# Patient Record
Sex: Female | Born: 1935
Health system: Southern US, Community
[De-identification: ages and names within clinical notes are randomized; demographics above are authoritative.]

## PROBLEM LIST (undated history)

## (undated) DIAGNOSIS — I10 Essential (primary) hypertension: Secondary | ICD-10-CM

## (undated) DIAGNOSIS — I471 Supraventricular tachycardia, unspecified: Secondary | ICD-10-CM

## (undated) DIAGNOSIS — I4891 Unspecified atrial fibrillation: Secondary | ICD-10-CM

## (undated) DIAGNOSIS — M810 Age-related osteoporosis without current pathological fracture: Secondary | ICD-10-CM

## (undated) DIAGNOSIS — R1310 Dysphagia, unspecified: Secondary | ICD-10-CM

## (undated) DIAGNOSIS — I639 Cerebral infarction, unspecified: Secondary | ICD-10-CM

---

## 2015-07-21 DIAGNOSIS — M25662 Stiffness of left knee, not elsewhere classified: Secondary | ICD-10-CM | POA: Diagnosis not present

## 2015-07-21 DIAGNOSIS — M25562 Pain in left knee: Secondary | ICD-10-CM | POA: Diagnosis not present

## 2015-07-21 DIAGNOSIS — M6281 Muscle weakness (generalized): Secondary | ICD-10-CM | POA: Diagnosis not present

## 2015-07-23 DIAGNOSIS — M6281 Muscle weakness (generalized): Secondary | ICD-10-CM | POA: Diagnosis not present

## 2015-07-23 DIAGNOSIS — M25562 Pain in left knee: Secondary | ICD-10-CM | POA: Diagnosis not present

## 2015-07-23 DIAGNOSIS — M25662 Stiffness of left knee, not elsewhere classified: Secondary | ICD-10-CM | POA: Diagnosis not present

## 2015-07-27 DIAGNOSIS — M25562 Pain in left knee: Secondary | ICD-10-CM | POA: Diagnosis not present

## 2015-07-27 DIAGNOSIS — M25662 Stiffness of left knee, not elsewhere classified: Secondary | ICD-10-CM | POA: Diagnosis not present

## 2015-07-27 DIAGNOSIS — M6281 Muscle weakness (generalized): Secondary | ICD-10-CM | POA: Diagnosis not present

## 2015-07-30 DIAGNOSIS — M25562 Pain in left knee: Secondary | ICD-10-CM | POA: Diagnosis not present

## 2015-07-30 DIAGNOSIS — M25662 Stiffness of left knee, not elsewhere classified: Secondary | ICD-10-CM | POA: Diagnosis not present

## 2015-07-30 DIAGNOSIS — M6281 Muscle weakness (generalized): Secondary | ICD-10-CM | POA: Diagnosis not present

## 2015-08-02 DIAGNOSIS — M25562 Pain in left knee: Secondary | ICD-10-CM | POA: Diagnosis not present

## 2015-08-02 DIAGNOSIS — M25662 Stiffness of left knee, not elsewhere classified: Secondary | ICD-10-CM | POA: Diagnosis not present

## 2015-08-02 DIAGNOSIS — M6281 Muscle weakness (generalized): Secondary | ICD-10-CM | POA: Diagnosis not present

## 2015-08-04 DIAGNOSIS — M25562 Pain in left knee: Secondary | ICD-10-CM | POA: Diagnosis not present

## 2015-08-04 DIAGNOSIS — M25662 Stiffness of left knee, not elsewhere classified: Secondary | ICD-10-CM | POA: Diagnosis not present

## 2015-08-04 DIAGNOSIS — M6281 Muscle weakness (generalized): Secondary | ICD-10-CM | POA: Diagnosis not present

## 2015-08-06 DIAGNOSIS — R21 Rash and other nonspecific skin eruption: Secondary | ICD-10-CM | POA: Diagnosis not present

## 2015-08-06 DIAGNOSIS — N952 Postmenopausal atrophic vaginitis: Secondary | ICD-10-CM | POA: Diagnosis not present

## 2015-08-06 DIAGNOSIS — I10 Essential (primary) hypertension: Secondary | ICD-10-CM | POA: Diagnosis not present

## 2015-08-06 DIAGNOSIS — E039 Hypothyroidism, unspecified: Secondary | ICD-10-CM | POA: Diagnosis not present

## 2015-08-11 DIAGNOSIS — M25562 Pain in left knee: Secondary | ICD-10-CM | POA: Diagnosis not present

## 2015-08-11 DIAGNOSIS — M25662 Stiffness of left knee, not elsewhere classified: Secondary | ICD-10-CM | POA: Diagnosis not present

## 2015-08-11 DIAGNOSIS — M6281 Muscle weakness (generalized): Secondary | ICD-10-CM | POA: Diagnosis not present

## 2015-08-13 DIAGNOSIS — M6281 Muscle weakness (generalized): Secondary | ICD-10-CM | POA: Diagnosis not present

## 2015-08-13 DIAGNOSIS — M25662 Stiffness of left knee, not elsewhere classified: Secondary | ICD-10-CM | POA: Diagnosis not present

## 2015-08-13 DIAGNOSIS — M25562 Pain in left knee: Secondary | ICD-10-CM | POA: Diagnosis not present

## 2015-08-16 DIAGNOSIS — M25562 Pain in left knee: Secondary | ICD-10-CM | POA: Diagnosis not present

## 2015-08-16 DIAGNOSIS — M25662 Stiffness of left knee, not elsewhere classified: Secondary | ICD-10-CM | POA: Diagnosis not present

## 2015-08-16 DIAGNOSIS — M6281 Muscle weakness (generalized): Secondary | ICD-10-CM | POA: Diagnosis not present

## 2015-08-18 DIAGNOSIS — M6281 Muscle weakness (generalized): Secondary | ICD-10-CM | POA: Diagnosis not present

## 2015-08-18 DIAGNOSIS — M25562 Pain in left knee: Secondary | ICD-10-CM | POA: Diagnosis not present

## 2015-08-18 DIAGNOSIS — M25662 Stiffness of left knee, not elsewhere classified: Secondary | ICD-10-CM | POA: Diagnosis not present

## 2015-08-23 DIAGNOSIS — H16213 Exposure keratoconjunctivitis, bilateral: Secondary | ICD-10-CM | POA: Diagnosis not present

## 2015-08-23 DIAGNOSIS — H02055 Trichiasis without entropian left lower eyelid: Secondary | ICD-10-CM | POA: Diagnosis not present

## 2015-08-23 DIAGNOSIS — M6281 Muscle weakness (generalized): Secondary | ICD-10-CM | POA: Diagnosis not present

## 2015-08-23 DIAGNOSIS — H52203 Unspecified astigmatism, bilateral: Secondary | ICD-10-CM | POA: Diagnosis not present

## 2015-08-23 DIAGNOSIS — M25562 Pain in left knee: Secondary | ICD-10-CM | POA: Diagnosis not present

## 2015-08-23 DIAGNOSIS — H16223 Keratoconjunctivitis sicca, not specified as Sjogren's, bilateral: Secondary | ICD-10-CM | POA: Diagnosis not present

## 2015-08-23 DIAGNOSIS — M25662 Stiffness of left knee, not elsewhere classified: Secondary | ICD-10-CM | POA: Diagnosis not present

## 2015-08-23 DIAGNOSIS — H43393 Other vitreous opacities, bilateral: Secondary | ICD-10-CM | POA: Diagnosis not present

## 2015-08-23 DIAGNOSIS — H02052 Trichiasis without entropian right lower eyelid: Secondary | ICD-10-CM | POA: Diagnosis not present

## 2015-08-24 DIAGNOSIS — M12812 Other specific arthropathies, not elsewhere classified, left shoulder: Secondary | ICD-10-CM | POA: Diagnosis not present

## 2015-08-24 DIAGNOSIS — M12811 Other specific arthropathies, not elsewhere classified, right shoulder: Secondary | ICD-10-CM | POA: Diagnosis not present

## 2015-08-25 DIAGNOSIS — M25562 Pain in left knee: Secondary | ICD-10-CM | POA: Diagnosis not present

## 2015-08-25 DIAGNOSIS — M25662 Stiffness of left knee, not elsewhere classified: Secondary | ICD-10-CM | POA: Diagnosis not present

## 2015-08-25 DIAGNOSIS — M6281 Muscle weakness (generalized): Secondary | ICD-10-CM | POA: Diagnosis not present

## 2015-09-13 DIAGNOSIS — Z9889 Other specified postprocedural states: Secondary | ICD-10-CM | POA: Diagnosis not present

## 2015-09-13 DIAGNOSIS — S82042D Displaced comminuted fracture of left patella, subsequent encounter for closed fracture with routine healing: Secondary | ICD-10-CM | POA: Diagnosis not present

## 2015-09-13 DIAGNOSIS — W19XXXD Unspecified fall, subsequent encounter: Secondary | ICD-10-CM | POA: Diagnosis not present

## 2015-09-13 DIAGNOSIS — Z4789 Encounter for other orthopedic aftercare: Secondary | ICD-10-CM | POA: Diagnosis not present

## 2015-10-21 DIAGNOSIS — M12812 Other specific arthropathies, not elsewhere classified, left shoulder: Secondary | ICD-10-CM | POA: Diagnosis not present

## 2015-10-26 DIAGNOSIS — H02052 Trichiasis without entropian right lower eyelid: Secondary | ICD-10-CM | POA: Diagnosis not present

## 2015-10-26 DIAGNOSIS — H52203 Unspecified astigmatism, bilateral: Secondary | ICD-10-CM | POA: Diagnosis not present

## 2015-10-26 DIAGNOSIS — H16213 Exposure keratoconjunctivitis, bilateral: Secondary | ICD-10-CM | POA: Diagnosis not present

## 2015-10-26 DIAGNOSIS — H02055 Trichiasis without entropian left lower eyelid: Secondary | ICD-10-CM | POA: Diagnosis not present

## 2015-10-26 DIAGNOSIS — H16223 Keratoconjunctivitis sicca, not specified as Sjogren's, bilateral: Secondary | ICD-10-CM | POA: Diagnosis not present

## 2015-12-28 DIAGNOSIS — H02055 Trichiasis without entropian left lower eyelid: Secondary | ICD-10-CM | POA: Diagnosis not present

## 2015-12-28 DIAGNOSIS — H02052 Trichiasis without entropian right lower eyelid: Secondary | ICD-10-CM | POA: Diagnosis not present

## 2016-01-20 DIAGNOSIS — M12812 Other specific arthropathies, not elsewhere classified, left shoulder: Secondary | ICD-10-CM | POA: Diagnosis not present

## 2016-01-24 DIAGNOSIS — Z1239 Encounter for other screening for malignant neoplasm of breast: Secondary | ICD-10-CM | POA: Diagnosis not present

## 2016-01-24 DIAGNOSIS — Z0000 Encounter for general adult medical examination without abnormal findings: Secondary | ICD-10-CM | POA: Diagnosis not present

## 2016-01-24 DIAGNOSIS — M150 Primary generalized (osteo)arthritis: Secondary | ICD-10-CM | POA: Diagnosis not present

## 2016-01-24 DIAGNOSIS — I10 Essential (primary) hypertension: Secondary | ICD-10-CM | POA: Diagnosis not present

## 2016-01-24 DIAGNOSIS — E039 Hypothyroidism, unspecified: Secondary | ICD-10-CM | POA: Diagnosis not present

## 2016-01-24 DIAGNOSIS — G609 Hereditary and idiopathic neuropathy, unspecified: Secondary | ICD-10-CM | POA: Diagnosis not present

## 2016-01-24 DIAGNOSIS — Z79899 Other long term (current) drug therapy: Secondary | ICD-10-CM | POA: Diagnosis not present

## 2016-01-24 DIAGNOSIS — M810 Age-related osteoporosis without current pathological fracture: Secondary | ICD-10-CM | POA: Diagnosis not present

## 2016-01-24 DIAGNOSIS — I471 Supraventricular tachycardia: Secondary | ICD-10-CM | POA: Diagnosis not present

## 2016-02-25 DIAGNOSIS — Z1239 Encounter for other screening for malignant neoplasm of breast: Secondary | ICD-10-CM | POA: Diagnosis not present

## 2016-02-25 DIAGNOSIS — M810 Age-related osteoporosis without current pathological fracture: Secondary | ICD-10-CM | POA: Diagnosis not present

## 2016-02-25 DIAGNOSIS — M949 Disorder of cartilage, unspecified: Secondary | ICD-10-CM | POA: Diagnosis not present

## 2016-02-25 DIAGNOSIS — M899 Disorder of bone, unspecified: Secondary | ICD-10-CM | POA: Diagnosis not present

## 2016-02-25 DIAGNOSIS — Z780 Asymptomatic menopausal state: Secondary | ICD-10-CM | POA: Diagnosis not present

## 2016-02-25 DIAGNOSIS — Z1231 Encounter for screening mammogram for malignant neoplasm of breast: Secondary | ICD-10-CM | POA: Diagnosis not present

## 2016-03-01 DIAGNOSIS — H02052 Trichiasis without entropian right lower eyelid: Secondary | ICD-10-CM | POA: Diagnosis not present

## 2016-03-01 DIAGNOSIS — H02055 Trichiasis without entropian left lower eyelid: Secondary | ICD-10-CM | POA: Diagnosis not present

## 2016-03-31 DIAGNOSIS — R05 Cough: Secondary | ICD-10-CM | POA: Diagnosis not present

## 2016-05-25 DIAGNOSIS — M79642 Pain in left hand: Secondary | ICD-10-CM | POA: Diagnosis not present

## 2016-05-31 DIAGNOSIS — M79642 Pain in left hand: Secondary | ICD-10-CM | POA: Diagnosis not present

## 2016-06-15 DIAGNOSIS — M79642 Pain in left hand: Secondary | ICD-10-CM | POA: Diagnosis not present

## 2016-06-26 DIAGNOSIS — H02052 Trichiasis without entropian right lower eyelid: Secondary | ICD-10-CM | POA: Diagnosis not present

## 2016-06-26 DIAGNOSIS — H02055 Trichiasis without entropian left lower eyelid: Secondary | ICD-10-CM | POA: Diagnosis not present

## 2016-07-06 DIAGNOSIS — S62649A Nondisplaced fracture of proximal phalanx of unspecified finger, initial encounter for closed fracture: Secondary | ICD-10-CM | POA: Diagnosis not present

## 2016-09-01 DIAGNOSIS — M810 Age-related osteoporosis without current pathological fracture: Secondary | ICD-10-CM | POA: Diagnosis not present

## 2016-10-03 DIAGNOSIS — H16223 Keratoconjunctivitis sicca, not specified as Sjogren's, bilateral: Secondary | ICD-10-CM | POA: Diagnosis not present

## 2016-10-03 DIAGNOSIS — Z961 Presence of intraocular lens: Secondary | ICD-10-CM | POA: Diagnosis not present

## 2016-10-03 DIAGNOSIS — H43393 Other vitreous opacities, bilateral: Secondary | ICD-10-CM | POA: Diagnosis not present

## 2016-10-03 DIAGNOSIS — H524 Presbyopia: Secondary | ICD-10-CM | POA: Diagnosis not present

## 2016-10-03 DIAGNOSIS — H02052 Trichiasis without entropian right lower eyelid: Secondary | ICD-10-CM | POA: Diagnosis not present

## 2016-10-03 DIAGNOSIS — H02055 Trichiasis without entropian left lower eyelid: Secondary | ICD-10-CM | POA: Diagnosis not present

## 2016-10-03 DIAGNOSIS — H52203 Unspecified astigmatism, bilateral: Secondary | ICD-10-CM | POA: Diagnosis not present

## 2016-10-18 DIAGNOSIS — Z0000 Encounter for general adult medical examination without abnormal findings: Secondary | ICD-10-CM | POA: Diagnosis not present

## 2016-12-06 DIAGNOSIS — H524 Presbyopia: Secondary | ICD-10-CM | POA: Diagnosis not present

## 2016-12-06 DIAGNOSIS — H16223 Keratoconjunctivitis sicca, not specified as Sjogren's, bilateral: Secondary | ICD-10-CM | POA: Diagnosis not present

## 2016-12-06 DIAGNOSIS — H02055 Trichiasis without entropian left lower eyelid: Secondary | ICD-10-CM | POA: Diagnosis not present

## 2016-12-06 DIAGNOSIS — H52203 Unspecified astigmatism, bilateral: Secondary | ICD-10-CM | POA: Diagnosis not present

## 2016-12-06 DIAGNOSIS — H02052 Trichiasis without entropian right lower eyelid: Secondary | ICD-10-CM | POA: Diagnosis not present

## 2017-01-10 DIAGNOSIS — I493 Ventricular premature depolarization: Secondary | ICD-10-CM | POA: Diagnosis not present

## 2017-01-10 DIAGNOSIS — K219 Gastro-esophageal reflux disease without esophagitis: Secondary | ICD-10-CM | POA: Diagnosis not present

## 2017-01-10 DIAGNOSIS — E039 Hypothyroidism, unspecified: Secondary | ICD-10-CM | POA: Diagnosis not present

## 2017-01-10 DIAGNOSIS — Z23 Encounter for immunization: Secondary | ICD-10-CM | POA: Diagnosis not present

## 2017-01-10 DIAGNOSIS — R5383 Other fatigue: Secondary | ICD-10-CM | POA: Diagnosis not present

## 2017-01-10 DIAGNOSIS — R269 Unspecified abnormalities of gait and mobility: Secondary | ICD-10-CM | POA: Diagnosis not present

## 2017-01-10 DIAGNOSIS — I10 Essential (primary) hypertension: Secondary | ICD-10-CM | POA: Diagnosis not present

## 2017-01-10 DIAGNOSIS — M810 Age-related osteoporosis without current pathological fracture: Secondary | ICD-10-CM | POA: Diagnosis not present

## 2017-01-19 DIAGNOSIS — E039 Hypothyroidism, unspecified: Secondary | ICD-10-CM | POA: Diagnosis not present

## 2017-01-19 DIAGNOSIS — I10 Essential (primary) hypertension: Secondary | ICD-10-CM | POA: Diagnosis not present

## 2017-01-19 DIAGNOSIS — M810 Age-related osteoporosis without current pathological fracture: Secondary | ICD-10-CM | POA: Diagnosis not present

## 2017-01-19 DIAGNOSIS — R5383 Other fatigue: Secondary | ICD-10-CM | POA: Diagnosis not present

## 2017-02-13 DIAGNOSIS — H16223 Keratoconjunctivitis sicca, not specified as Sjogren's, bilateral: Secondary | ICD-10-CM | POA: Diagnosis not present

## 2017-02-13 DIAGNOSIS — H02052 Trichiasis without entropian right lower eyelid: Secondary | ICD-10-CM | POA: Diagnosis not present

## 2017-02-13 DIAGNOSIS — H02055 Trichiasis without entropian left lower eyelid: Secondary | ICD-10-CM | POA: Diagnosis not present

## 2017-02-13 DIAGNOSIS — H16213 Exposure keratoconjunctivitis, bilateral: Secondary | ICD-10-CM | POA: Diagnosis not present

## 2017-02-19 DIAGNOSIS — I10 Essential (primary) hypertension: Secondary | ICD-10-CM | POA: Diagnosis not present

## 2017-02-19 DIAGNOSIS — M79672 Pain in left foot: Secondary | ICD-10-CM | POA: Diagnosis not present

## 2017-02-21 DIAGNOSIS — R2689 Other abnormalities of gait and mobility: Secondary | ICD-10-CM | POA: Diagnosis not present

## 2017-02-21 DIAGNOSIS — G8929 Other chronic pain: Secondary | ICD-10-CM | POA: Diagnosis not present

## 2017-02-21 DIAGNOSIS — R29898 Other symptoms and signs involving the musculoskeletal system: Secondary | ICD-10-CM | POA: Diagnosis not present

## 2017-02-21 DIAGNOSIS — M25562 Pain in left knee: Secondary | ICD-10-CM | POA: Diagnosis not present

## 2017-02-21 DIAGNOSIS — M25662 Stiffness of left knee, not elsewhere classified: Secondary | ICD-10-CM | POA: Diagnosis not present

## 2017-02-26 DIAGNOSIS — M79672 Pain in left foot: Secondary | ICD-10-CM | POA: Diagnosis not present

## 2017-02-26 DIAGNOSIS — M216X2 Other acquired deformities of left foot: Secondary | ICD-10-CM | POA: Diagnosis not present

## 2017-02-26 DIAGNOSIS — M7742 Metatarsalgia, left foot: Secondary | ICD-10-CM | POA: Diagnosis not present

## 2017-02-27 DIAGNOSIS — I10 Essential (primary) hypertension: Secondary | ICD-10-CM | POA: Diagnosis not present

## 2017-03-20 DIAGNOSIS — R2689 Other abnormalities of gait and mobility: Secondary | ICD-10-CM | POA: Diagnosis not present

## 2017-04-27 DIAGNOSIS — H02055 Trichiasis without entropian left lower eyelid: Secondary | ICD-10-CM | POA: Diagnosis not present

## 2017-04-27 DIAGNOSIS — H16223 Keratoconjunctivitis sicca, not specified as Sjogren's, bilateral: Secondary | ICD-10-CM | POA: Diagnosis not present

## 2017-04-27 DIAGNOSIS — H02052 Trichiasis without entropian right lower eyelid: Secondary | ICD-10-CM | POA: Diagnosis not present

## 2017-05-22 DIAGNOSIS — E039 Hypothyroidism, unspecified: Secondary | ICD-10-CM | POA: Diagnosis not present

## 2017-05-22 DIAGNOSIS — I10 Essential (primary) hypertension: Secondary | ICD-10-CM | POA: Diagnosis not present

## 2017-05-22 DIAGNOSIS — Z23 Encounter for immunization: Secondary | ICD-10-CM | POA: Diagnosis not present

## 2017-05-22 DIAGNOSIS — K219 Gastro-esophageal reflux disease without esophagitis: Secondary | ICD-10-CM | POA: Diagnosis not present

## 2017-06-11 DIAGNOSIS — H02052 Trichiasis without entropian right lower eyelid: Secondary | ICD-10-CM | POA: Diagnosis not present

## 2017-06-11 DIAGNOSIS — H02055 Trichiasis without entropian left lower eyelid: Secondary | ICD-10-CM | POA: Diagnosis not present

## 2017-06-11 DIAGNOSIS — H04321 Acute dacryocystitis of right lacrimal passage: Secondary | ICD-10-CM | POA: Diagnosis not present

## 2017-06-19 DIAGNOSIS — H04321 Acute dacryocystitis of right lacrimal passage: Secondary | ICD-10-CM | POA: Diagnosis not present

## 2017-06-29 DIAGNOSIS — H04203 Unspecified epiphora, bilateral lacrimal glands: Secondary | ICD-10-CM | POA: Diagnosis not present

## 2017-06-29 DIAGNOSIS — H04563 Stenosis of bilateral lacrimal punctum: Secondary | ICD-10-CM | POA: Diagnosis not present

## 2017-06-29 DIAGNOSIS — H04551 Acquired stenosis of right nasolacrimal duct: Secondary | ICD-10-CM | POA: Diagnosis not present

## 2017-06-29 DIAGNOSIS — H04511 Dacryolith of right lacrimal passage: Secondary | ICD-10-CM | POA: Diagnosis not present

## 2017-06-29 DIAGNOSIS — L0201 Cutaneous abscess of face: Secondary | ICD-10-CM | POA: Diagnosis not present

## 2017-07-02 DIAGNOSIS — H02052 Trichiasis without entropian right lower eyelid: Secondary | ICD-10-CM | POA: Diagnosis not present

## 2017-08-10 DIAGNOSIS — H04321 Acute dacryocystitis of right lacrimal passage: Secondary | ICD-10-CM | POA: Diagnosis not present

## 2017-09-13 DIAGNOSIS — M810 Age-related osteoporosis without current pathological fracture: Secondary | ICD-10-CM | POA: Diagnosis not present

## 2017-09-24 DIAGNOSIS — H04123 Dry eye syndrome of bilateral lacrimal glands: Secondary | ICD-10-CM | POA: Diagnosis not present

## 2017-09-24 DIAGNOSIS — H04563 Stenosis of bilateral lacrimal punctum: Secondary | ICD-10-CM | POA: Diagnosis not present

## 2017-09-24 DIAGNOSIS — H04223 Epiphora due to insufficient drainage, bilateral lacrimal glands: Secondary | ICD-10-CM | POA: Diagnosis not present

## 2017-11-07 DIAGNOSIS — Z01818 Encounter for other preprocedural examination: Secondary | ICD-10-CM | POA: Diagnosis not present

## 2017-11-07 DIAGNOSIS — E039 Hypothyroidism, unspecified: Secondary | ICD-10-CM | POA: Diagnosis not present

## 2017-11-07 DIAGNOSIS — I10 Essential (primary) hypertension: Secondary | ICD-10-CM | POA: Diagnosis not present

## 2017-11-09 DIAGNOSIS — I10 Essential (primary) hypertension: Secondary | ICD-10-CM | POA: Diagnosis not present

## 2017-11-13 DIAGNOSIS — H02052 Trichiasis without entropian right lower eyelid: Secondary | ICD-10-CM | POA: Diagnosis not present

## 2017-11-13 DIAGNOSIS — H04123 Dry eye syndrome of bilateral lacrimal glands: Secondary | ICD-10-CM | POA: Diagnosis not present

## 2017-11-13 DIAGNOSIS — H16213 Exposure keratoconjunctivitis, bilateral: Secondary | ICD-10-CM | POA: Diagnosis not present

## 2017-11-13 DIAGNOSIS — H02055 Trichiasis without entropian left lower eyelid: Secondary | ICD-10-CM | POA: Diagnosis not present

## 2017-11-15 DIAGNOSIS — I493 Ventricular premature depolarization: Secondary | ICD-10-CM | POA: Diagnosis not present

## 2017-11-15 DIAGNOSIS — R000 Tachycardia, unspecified: Secondary | ICD-10-CM | POA: Diagnosis not present

## 2017-11-15 DIAGNOSIS — I499 Cardiac arrhythmia, unspecified: Secondary | ICD-10-CM | POA: Diagnosis not present

## 2017-11-15 DIAGNOSIS — H04551 Acquired stenosis of right nasolacrimal duct: Secondary | ICD-10-CM | POA: Diagnosis not present

## 2017-11-15 DIAGNOSIS — H049 Disorder of lacrimal system, unspecified: Secondary | ICD-10-CM | POA: Diagnosis not present

## 2017-11-15 DIAGNOSIS — Z5309 Procedure and treatment not carried out because of other contraindication: Secondary | ICD-10-CM | POA: Diagnosis not present

## 2017-11-20 DIAGNOSIS — I10 Essential (primary) hypertension: Secondary | ICD-10-CM | POA: Diagnosis not present

## 2017-11-20 DIAGNOSIS — I493 Ventricular premature depolarization: Secondary | ICD-10-CM | POA: Diagnosis not present

## 2017-11-20 DIAGNOSIS — E875 Hyperkalemia: Secondary | ICD-10-CM | POA: Diagnosis not present

## 2017-11-20 DIAGNOSIS — R9431 Abnormal electrocardiogram [ECG] [EKG]: Secondary | ICD-10-CM | POA: Diagnosis not present

## 2017-11-21 DIAGNOSIS — I493 Ventricular premature depolarization: Secondary | ICD-10-CM | POA: Diagnosis not present

## 2017-11-22 DIAGNOSIS — R9431 Abnormal electrocardiogram [ECG] [EKG]: Secondary | ICD-10-CM | POA: Diagnosis not present

## 2017-11-22 DIAGNOSIS — I471 Supraventricular tachycardia: Secondary | ICD-10-CM | POA: Diagnosis not present

## 2017-12-03 DIAGNOSIS — N183 Chronic kidney disease, stage 3 (moderate): Secondary | ICD-10-CM | POA: Diagnosis not present

## 2017-12-03 DIAGNOSIS — M810 Age-related osteoporosis without current pathological fracture: Secondary | ICD-10-CM | POA: Diagnosis not present

## 2017-12-03 DIAGNOSIS — I129 Hypertensive chronic kidney disease with stage 1 through stage 4 chronic kidney disease, or unspecified chronic kidney disease: Secondary | ICD-10-CM | POA: Diagnosis not present

## 2017-12-06 DIAGNOSIS — E039 Hypothyroidism, unspecified: Secondary | ICD-10-CM | POA: Diagnosis not present

## 2017-12-06 DIAGNOSIS — Z888 Allergy status to other drugs, medicaments and biological substances status: Secondary | ICD-10-CM | POA: Diagnosis not present

## 2017-12-06 DIAGNOSIS — Z79899 Other long term (current) drug therapy: Secondary | ICD-10-CM | POA: Diagnosis not present

## 2017-12-06 DIAGNOSIS — I10 Essential (primary) hypertension: Secondary | ICD-10-CM | POA: Diagnosis not present

## 2017-12-06 DIAGNOSIS — Z791 Long term (current) use of non-steroidal anti-inflammatories (NSAID): Secondary | ICD-10-CM | POA: Diagnosis not present

## 2017-12-06 DIAGNOSIS — Z881 Allergy status to other antibiotic agents status: Secondary | ICD-10-CM | POA: Diagnosis not present

## 2017-12-06 DIAGNOSIS — Z91048 Other nonmedicinal substance allergy status: Secondary | ICD-10-CM | POA: Diagnosis not present

## 2017-12-06 DIAGNOSIS — H04559 Acquired stenosis of unspecified nasolacrimal duct: Secondary | ICD-10-CM | POA: Diagnosis not present

## 2017-12-06 DIAGNOSIS — Z91018 Allergy to other foods: Secondary | ICD-10-CM | POA: Diagnosis not present

## 2017-12-06 DIAGNOSIS — Z83518 Family history of other specified eye disorder: Secondary | ICD-10-CM | POA: Diagnosis not present

## 2017-12-06 DIAGNOSIS — M1990 Unspecified osteoarthritis, unspecified site: Secondary | ICD-10-CM | POA: Diagnosis not present

## 2017-12-06 DIAGNOSIS — H04551 Acquired stenosis of right nasolacrimal duct: Secondary | ICD-10-CM | POA: Diagnosis not present

## 2017-12-06 DIAGNOSIS — H04541 Stenosis of right lacrimal canaliculi: Secondary | ICD-10-CM | POA: Diagnosis not present

## 2017-12-06 DIAGNOSIS — K219 Gastro-esophageal reflux disease without esophagitis: Secondary | ICD-10-CM | POA: Diagnosis not present

## 2017-12-17 DIAGNOSIS — H04551 Acquired stenosis of right nasolacrimal duct: Secondary | ICD-10-CM | POA: Diagnosis not present

## 2018-01-01 DIAGNOSIS — H02052 Trichiasis without entropian right lower eyelid: Secondary | ICD-10-CM | POA: Diagnosis not present

## 2018-01-01 DIAGNOSIS — H04551 Acquired stenosis of right nasolacrimal duct: Secondary | ICD-10-CM | POA: Diagnosis not present

## 2018-01-29 DIAGNOSIS — H02052 Trichiasis without entropian right lower eyelid: Secondary | ICD-10-CM | POA: Diagnosis not present

## 2018-01-29 DIAGNOSIS — H04551 Acquired stenosis of right nasolacrimal duct: Secondary | ICD-10-CM | POA: Diagnosis not present

## 2018-03-06 DIAGNOSIS — N183 Chronic kidney disease, stage 3 (moderate): Secondary | ICD-10-CM | POA: Diagnosis not present

## 2018-03-06 DIAGNOSIS — E039 Hypothyroidism, unspecified: Secondary | ICD-10-CM | POA: Diagnosis not present

## 2018-03-06 DIAGNOSIS — I129 Hypertensive chronic kidney disease with stage 1 through stage 4 chronic kidney disease, or unspecified chronic kidney disease: Secondary | ICD-10-CM | POA: Diagnosis not present

## 2018-03-06 DIAGNOSIS — R011 Cardiac murmur, unspecified: Secondary | ICD-10-CM | POA: Diagnosis not present

## 2018-03-06 DIAGNOSIS — Z1231 Encounter for screening mammogram for malignant neoplasm of breast: Secondary | ICD-10-CM | POA: Diagnosis not present

## 2018-03-06 DIAGNOSIS — E559 Vitamin D deficiency, unspecified: Secondary | ICD-10-CM | POA: Diagnosis not present

## 2018-03-08 DIAGNOSIS — I082 Rheumatic disorders of both aortic and tricuspid valves: Secondary | ICD-10-CM | POA: Diagnosis not present

## 2018-03-08 DIAGNOSIS — I272 Pulmonary hypertension, unspecified: Secondary | ICD-10-CM | POA: Diagnosis not present

## 2018-03-14 DIAGNOSIS — I129 Hypertensive chronic kidney disease with stage 1 through stage 4 chronic kidney disease, or unspecified chronic kidney disease: Secondary | ICD-10-CM | POA: Diagnosis not present

## 2018-03-14 DIAGNOSIS — N183 Chronic kidney disease, stage 3 (moderate): Secondary | ICD-10-CM | POA: Diagnosis not present

## 2018-04-02 DIAGNOSIS — H02055 Trichiasis without entropian left lower eyelid: Secondary | ICD-10-CM | POA: Diagnosis not present

## 2018-04-09 DIAGNOSIS — H02052 Trichiasis without entropian right lower eyelid: Secondary | ICD-10-CM | POA: Diagnosis not present

## 2018-04-09 DIAGNOSIS — H04551 Acquired stenosis of right nasolacrimal duct: Secondary | ICD-10-CM | POA: Diagnosis not present

## 2018-05-13 DIAGNOSIS — I129 Hypertensive chronic kidney disease with stage 1 through stage 4 chronic kidney disease, or unspecified chronic kidney disease: Secondary | ICD-10-CM | POA: Diagnosis not present

## 2018-05-13 DIAGNOSIS — N183 Chronic kidney disease, stage 3 (moderate): Secondary | ICD-10-CM | POA: Diagnosis not present

## 2018-06-12 DIAGNOSIS — E039 Hypothyroidism, unspecified: Secondary | ICD-10-CM | POA: Diagnosis not present

## 2018-06-12 DIAGNOSIS — K219 Gastro-esophageal reflux disease without esophagitis: Secondary | ICD-10-CM | POA: Diagnosis not present

## 2018-06-12 DIAGNOSIS — I129 Hypertensive chronic kidney disease with stage 1 through stage 4 chronic kidney disease, or unspecified chronic kidney disease: Secondary | ICD-10-CM | POA: Diagnosis not present

## 2018-06-12 DIAGNOSIS — L259 Unspecified contact dermatitis, unspecified cause: Secondary | ICD-10-CM | POA: Diagnosis not present

## 2018-06-12 DIAGNOSIS — N183 Chronic kidney disease, stage 3 (moderate): Secondary | ICD-10-CM | POA: Diagnosis not present

## 2018-07-02 DIAGNOSIS — I471 Supraventricular tachycardia: Secondary | ICD-10-CM | POA: Diagnosis not present

## 2018-07-25 DIAGNOSIS — M810 Age-related osteoporosis without current pathological fracture: Secondary | ICD-10-CM | POA: Diagnosis not present

## 2018-08-02 DIAGNOSIS — R0982 Postnasal drip: Secondary | ICD-10-CM | POA: Diagnosis not present

## 2018-09-11 DIAGNOSIS — N183 Chronic kidney disease, stage 3 (moderate): Secondary | ICD-10-CM | POA: Diagnosis not present

## 2018-09-11 DIAGNOSIS — Z1239 Encounter for other screening for malignant neoplasm of breast: Secondary | ICD-10-CM | POA: Diagnosis not present

## 2018-09-11 DIAGNOSIS — M150 Primary generalized (osteo)arthritis: Secondary | ICD-10-CM | POA: Diagnosis not present

## 2018-09-11 DIAGNOSIS — E039 Hypothyroidism, unspecified: Secondary | ICD-10-CM | POA: Diagnosis not present

## 2018-09-11 DIAGNOSIS — Z0000 Encounter for general adult medical examination without abnormal findings: Secondary | ICD-10-CM | POA: Diagnosis not present

## 2018-09-11 DIAGNOSIS — D492 Neoplasm of unspecified behavior of bone, soft tissue, and skin: Secondary | ICD-10-CM | POA: Diagnosis not present

## 2018-09-11 DIAGNOSIS — R42 Dizziness and giddiness: Secondary | ICD-10-CM | POA: Diagnosis not present

## 2018-09-11 DIAGNOSIS — M810 Age-related osteoporosis without current pathological fracture: Secondary | ICD-10-CM | POA: Diagnosis not present

## 2018-09-11 DIAGNOSIS — I129 Hypertensive chronic kidney disease with stage 1 through stage 4 chronic kidney disease, or unspecified chronic kidney disease: Secondary | ICD-10-CM | POA: Diagnosis not present

## 2018-09-12 DIAGNOSIS — E039 Hypothyroidism, unspecified: Secondary | ICD-10-CM | POA: Diagnosis not present

## 2018-09-12 DIAGNOSIS — M810 Age-related osteoporosis without current pathological fracture: Secondary | ICD-10-CM | POA: Diagnosis not present

## 2018-09-12 DIAGNOSIS — I129 Hypertensive chronic kidney disease with stage 1 through stage 4 chronic kidney disease, or unspecified chronic kidney disease: Secondary | ICD-10-CM | POA: Diagnosis not present

## 2018-09-12 DIAGNOSIS — N183 Chronic kidney disease, stage 3 (moderate): Secondary | ICD-10-CM | POA: Diagnosis not present

## 2018-09-24 DIAGNOSIS — L578 Other skin changes due to chronic exposure to nonionizing radiation: Secondary | ICD-10-CM | POA: Diagnosis not present

## 2018-09-24 DIAGNOSIS — L570 Actinic keratosis: Secondary | ICD-10-CM | POA: Diagnosis not present

## 2018-11-06 DIAGNOSIS — D0439 Carcinoma in situ of skin of other parts of face: Secondary | ICD-10-CM | POA: Diagnosis not present

## 2018-12-16 DIAGNOSIS — L659 Nonscarring hair loss, unspecified: Secondary | ICD-10-CM | POA: Diagnosis not present

## 2018-12-16 DIAGNOSIS — E559 Vitamin D deficiency, unspecified: Secondary | ICD-10-CM | POA: Diagnosis not present

## 2018-12-16 DIAGNOSIS — M810 Age-related osteoporosis without current pathological fracture: Secondary | ICD-10-CM | POA: Diagnosis not present

## 2018-12-16 DIAGNOSIS — E039 Hypothyroidism, unspecified: Secondary | ICD-10-CM | POA: Diagnosis not present

## 2018-12-16 DIAGNOSIS — R5383 Other fatigue: Secondary | ICD-10-CM | POA: Diagnosis not present

## 2018-12-16 DIAGNOSIS — Z1231 Encounter for screening mammogram for malignant neoplasm of breast: Secondary | ICD-10-CM | POA: Diagnosis not present

## 2018-12-16 DIAGNOSIS — I129 Hypertensive chronic kidney disease with stage 1 through stage 4 chronic kidney disease, or unspecified chronic kidney disease: Secondary | ICD-10-CM | POA: Diagnosis not present

## 2018-12-16 DIAGNOSIS — N183 Chronic kidney disease, stage 3 (moderate): Secondary | ICD-10-CM | POA: Diagnosis not present

## 2018-12-17 DIAGNOSIS — M810 Age-related osteoporosis without current pathological fracture: Secondary | ICD-10-CM | POA: Diagnosis not present

## 2018-12-17 DIAGNOSIS — M8949 Other hypertrophic osteoarthropathy, multiple sites: Secondary | ICD-10-CM | POA: Diagnosis not present

## 2018-12-17 DIAGNOSIS — E559 Vitamin D deficiency, unspecified: Secondary | ICD-10-CM | POA: Diagnosis not present

## 2018-12-17 DIAGNOSIS — R42 Dizziness and giddiness: Secondary | ICD-10-CM | POA: Diagnosis not present

## 2018-12-17 DIAGNOSIS — I471 Supraventricular tachycardia: Secondary | ICD-10-CM | POA: Diagnosis not present

## 2018-12-17 DIAGNOSIS — E039 Hypothyroidism, unspecified: Secondary | ICD-10-CM | POA: Diagnosis not present

## 2018-12-17 DIAGNOSIS — I129 Hypertensive chronic kidney disease with stage 1 through stage 4 chronic kidney disease, or unspecified chronic kidney disease: Secondary | ICD-10-CM | POA: Diagnosis not present

## 2018-12-17 DIAGNOSIS — N183 Chronic kidney disease, stage 3 (moderate): Secondary | ICD-10-CM | POA: Diagnosis not present

## 2018-12-17 DIAGNOSIS — L659 Nonscarring hair loss, unspecified: Secondary | ICD-10-CM | POA: Diagnosis not present

## 2018-12-17 DIAGNOSIS — R5383 Other fatigue: Secondary | ICD-10-CM | POA: Diagnosis not present

## 2019-01-10 DIAGNOSIS — H0100B Unspecified blepharitis left eye, upper and lower eyelids: Secondary | ICD-10-CM | POA: Diagnosis not present

## 2019-01-10 DIAGNOSIS — H02052 Trichiasis without entropian right lower eyelid: Secondary | ICD-10-CM | POA: Diagnosis not present

## 2019-01-10 DIAGNOSIS — H52203 Unspecified astigmatism, bilateral: Secondary | ICD-10-CM | POA: Diagnosis not present

## 2019-01-10 DIAGNOSIS — H02055 Trichiasis without entropian left lower eyelid: Secondary | ICD-10-CM | POA: Diagnosis not present

## 2019-01-10 DIAGNOSIS — H04123 Dry eye syndrome of bilateral lacrimal glands: Secondary | ICD-10-CM | POA: Diagnosis not present

## 2019-01-10 DIAGNOSIS — H0100A Unspecified blepharitis right eye, upper and lower eyelids: Secondary | ICD-10-CM | POA: Diagnosis not present

## 2019-01-10 DIAGNOSIS — H35372 Puckering of macula, left eye: Secondary | ICD-10-CM | POA: Diagnosis not present

## 2019-01-10 DIAGNOSIS — H524 Presbyopia: Secondary | ICD-10-CM | POA: Diagnosis not present

## 2019-01-10 DIAGNOSIS — H353131 Nonexudative age-related macular degeneration, bilateral, early dry stage: Secondary | ICD-10-CM | POA: Diagnosis not present

## 2019-01-10 DIAGNOSIS — H43813 Vitreous degeneration, bilateral: Secondary | ICD-10-CM | POA: Diagnosis not present

## 2019-01-10 DIAGNOSIS — H47093 Other disorders of optic nerve, not elsewhere classified, bilateral: Secondary | ICD-10-CM | POA: Diagnosis not present

## 2019-01-10 DIAGNOSIS — Z961 Presence of intraocular lens: Secondary | ICD-10-CM | POA: Diagnosis not present

## 2019-02-05 DIAGNOSIS — L578 Other skin changes due to chronic exposure to nonionizing radiation: Secondary | ICD-10-CM | POA: Diagnosis not present

## 2019-03-19 DIAGNOSIS — M8949 Other hypertrophic osteoarthropathy, multiple sites: Secondary | ICD-10-CM | POA: Diagnosis not present

## 2019-03-19 DIAGNOSIS — K219 Gastro-esophageal reflux disease without esophagitis: Secondary | ICD-10-CM | POA: Diagnosis not present

## 2019-03-19 DIAGNOSIS — M810 Age-related osteoporosis without current pathological fracture: Secondary | ICD-10-CM | POA: Diagnosis not present

## 2019-03-19 DIAGNOSIS — I129 Hypertensive chronic kidney disease with stage 1 through stage 4 chronic kidney disease, or unspecified chronic kidney disease: Secondary | ICD-10-CM | POA: Diagnosis not present

## 2019-03-19 DIAGNOSIS — E039 Hypothyroidism, unspecified: Secondary | ICD-10-CM | POA: Diagnosis not present

## 2019-03-19 DIAGNOSIS — N183 Chronic kidney disease, stage 3 (moderate): Secondary | ICD-10-CM | POA: Diagnosis not present

## 2019-03-26 ENCOUNTER — Emergency Department (HOSPITAL_COMMUNITY): Payer: PPO

## 2019-03-26 ENCOUNTER — Inpatient Hospital Stay (HOSPITAL_COMMUNITY): Payer: PPO

## 2019-03-26 ENCOUNTER — Emergency Department (HOSPITAL_COMMUNITY): Payer: PPO | Admitting: Certified Registered Nurse Anesthetist

## 2019-03-26 ENCOUNTER — Encounter (HOSPITAL_COMMUNITY)
Admission: EM | Disposition: A | Discharge status: Skilled Nursing Facility | Payer: Self-pay | Source: Home / Self Care | Attending: Neurology

## 2019-03-26 ENCOUNTER — Encounter (HOSPITAL_COMMUNITY): Payer: Self-pay | Admitting: Emergency Medicine

## 2019-03-26 ENCOUNTER — Other Ambulatory Visit: Payer: Self-pay

## 2019-03-26 ENCOUNTER — Inpatient Hospital Stay (HOSPITAL_COMMUNITY)
Admission: EM | Admit: 2019-03-26 | Discharge: 2019-04-02 | DRG: 065 | Disposition: A | Discharge status: Skilled Nursing Facility | Payer: PPO | Attending: Neurology | Admitting: Neurology

## 2019-03-26 DIAGNOSIS — G8194 Hemiplegia, unspecified affecting left nondominant side: Secondary | ICD-10-CM | POA: Diagnosis present

## 2019-03-26 DIAGNOSIS — R29810 Facial weakness: Secondary | ICD-10-CM | POA: Diagnosis not present

## 2019-03-26 DIAGNOSIS — Z880 Allergy status to penicillin: Secondary | ICD-10-CM

## 2019-03-26 DIAGNOSIS — I491 Atrial premature depolarization: Secondary | ICD-10-CM | POA: Diagnosis not present

## 2019-03-26 DIAGNOSIS — N390 Urinary tract infection, site not specified: Secondary | ICD-10-CM | POA: Diagnosis not present

## 2019-03-26 DIAGNOSIS — I10 Essential (primary) hypertension: Secondary | ICD-10-CM | POA: Diagnosis not present

## 2019-03-26 DIAGNOSIS — Z791 Long term (current) use of non-steroidal anti-inflammatories (NSAID): Secondary | ICD-10-CM

## 2019-03-26 DIAGNOSIS — R2689 Other abnormalities of gait and mobility: Secondary | ICD-10-CM | POA: Diagnosis not present

## 2019-03-26 DIAGNOSIS — R1312 Dysphagia, oropharyngeal phase: Secondary | ICD-10-CM | POA: Diagnosis present

## 2019-03-26 DIAGNOSIS — M25422 Effusion, left elbow: Secondary | ICD-10-CM | POA: Diagnosis not present

## 2019-03-26 DIAGNOSIS — Z823 Family history of stroke: Secondary | ICD-10-CM

## 2019-03-26 DIAGNOSIS — R739 Hyperglycemia, unspecified: Secondary | ICD-10-CM | POA: Diagnosis present

## 2019-03-26 DIAGNOSIS — Z8701 Personal history of pneumonia (recurrent): Secondary | ICD-10-CM

## 2019-03-26 DIAGNOSIS — I4891 Unspecified atrial fibrillation: Secondary | ICD-10-CM | POA: Diagnosis not present

## 2019-03-26 DIAGNOSIS — E440 Moderate protein-calorie malnutrition: Secondary | ICD-10-CM | POA: Diagnosis not present

## 2019-03-26 DIAGNOSIS — S199XXA Unspecified injury of neck, initial encounter: Secondary | ICD-10-CM | POA: Diagnosis not present

## 2019-03-26 DIAGNOSIS — R0689 Other abnormalities of breathing: Secondary | ICD-10-CM | POA: Diagnosis present

## 2019-03-26 DIAGNOSIS — R069 Unspecified abnormalities of breathing: Secondary | ICD-10-CM

## 2019-03-26 DIAGNOSIS — J9690 Respiratory failure, unspecified, unspecified whether with hypoxia or hypercapnia: Secondary | ICD-10-CM

## 2019-03-26 DIAGNOSIS — R2681 Unsteadiness on feet: Secondary | ICD-10-CM | POA: Diagnosis not present

## 2019-03-26 DIAGNOSIS — R29818 Other symptoms and signs involving the nervous system: Secondary | ICD-10-CM | POA: Diagnosis not present

## 2019-03-26 DIAGNOSIS — Z8719 Personal history of other diseases of the digestive system: Secondary | ICD-10-CM

## 2019-03-26 DIAGNOSIS — Z79899 Other long term (current) drug therapy: Secondary | ICD-10-CM

## 2019-03-26 DIAGNOSIS — I63513 Cerebral infarction due to unspecified occlusion or stenosis of bilateral middle cerebral arteries: Secondary | ICD-10-CM | POA: Diagnosis not present

## 2019-03-26 DIAGNOSIS — G629 Polyneuropathy, unspecified: Secondary | ICD-10-CM | POA: Diagnosis present

## 2019-03-26 DIAGNOSIS — Z4682 Encounter for fitting and adjustment of non-vascular catheter: Secondary | ICD-10-CM | POA: Diagnosis not present

## 2019-03-26 DIAGNOSIS — H518 Other specified disorders of binocular movement: Secondary | ICD-10-CM | POA: Diagnosis present

## 2019-03-26 DIAGNOSIS — R471 Dysarthria and anarthria: Secondary | ICD-10-CM | POA: Diagnosis not present

## 2019-03-26 DIAGNOSIS — R4189 Other symptoms and signs involving cognitive functions and awareness: Secondary | ICD-10-CM | POA: Diagnosis present

## 2019-03-26 DIAGNOSIS — K5900 Constipation, unspecified: Secondary | ICD-10-CM | POA: Diagnosis not present

## 2019-03-26 DIAGNOSIS — I499 Cardiac arrhythmia, unspecified: Secondary | ICD-10-CM | POA: Diagnosis not present

## 2019-03-26 DIAGNOSIS — M810 Age-related osteoporosis without current pathological fracture: Secondary | ICD-10-CM | POA: Diagnosis not present

## 2019-03-26 DIAGNOSIS — I471 Supraventricular tachycardia, unspecified: Secondary | ICD-10-CM | POA: Diagnosis present

## 2019-03-26 DIAGNOSIS — I129 Hypertensive chronic kidney disease with stage 1 through stage 4 chronic kidney disease, or unspecified chronic kidney disease: Secondary | ICD-10-CM | POA: Diagnosis not present

## 2019-03-26 DIAGNOSIS — R4702 Dysphasia: Secondary | ICD-10-CM | POA: Diagnosis not present

## 2019-03-26 DIAGNOSIS — E039 Hypothyroidism, unspecified: Secondary | ICD-10-CM | POA: Diagnosis present

## 2019-03-26 DIAGNOSIS — E785 Hyperlipidemia, unspecified: Secondary | ICD-10-CM | POA: Diagnosis present

## 2019-03-26 DIAGNOSIS — J9601 Acute respiratory failure with hypoxia: Secondary | ICD-10-CM | POA: Diagnosis not present

## 2019-03-26 DIAGNOSIS — I63511 Cerebral infarction due to unspecified occlusion or stenosis of right middle cerebral artery: Secondary | ICD-10-CM | POA: Diagnosis not present

## 2019-03-26 DIAGNOSIS — R278 Other lack of coordination: Secondary | ICD-10-CM | POA: Diagnosis not present

## 2019-03-26 DIAGNOSIS — M1990 Unspecified osteoarthritis, unspecified site: Secondary | ICD-10-CM | POA: Diagnosis present

## 2019-03-26 DIAGNOSIS — N183 Chronic kidney disease, stage 3 unspecified: Secondary | ICD-10-CM | POA: Diagnosis not present

## 2019-03-26 DIAGNOSIS — Z789 Other specified health status: Secondary | ICD-10-CM | POA: Diagnosis not present

## 2019-03-26 DIAGNOSIS — W19XXXA Unspecified fall, initial encounter: Secondary | ICD-10-CM | POA: Diagnosis present

## 2019-03-26 DIAGNOSIS — Y92019 Unspecified place in single-family (private) house as the place of occurrence of the external cause: Secondary | ICD-10-CM

## 2019-03-26 DIAGNOSIS — Z20828 Contact with and (suspected) exposure to other viral communicable diseases: Secondary | ICD-10-CM | POA: Diagnosis present

## 2019-03-26 DIAGNOSIS — H01009 Unspecified blepharitis unspecified eye, unspecified eyelid: Secondary | ICD-10-CM | POA: Diagnosis present

## 2019-03-26 DIAGNOSIS — M75102 Unspecified rotator cuff tear or rupture of left shoulder, not specified as traumatic: Secondary | ICD-10-CM | POA: Diagnosis present

## 2019-03-26 DIAGNOSIS — I493 Ventricular premature depolarization: Secondary | ICD-10-CM | POA: Diagnosis present

## 2019-03-26 DIAGNOSIS — Z6820 Body mass index (BMI) 20.0-20.9, adult: Secondary | ICD-10-CM

## 2019-03-26 DIAGNOSIS — I63512 Cerebral infarction due to unspecified occlusion or stenosis of left middle cerebral artery: Secondary | ICD-10-CM | POA: Diagnosis not present

## 2019-03-26 DIAGNOSIS — K219 Gastro-esophageal reflux disease without esophagitis: Secondary | ICD-10-CM | POA: Diagnosis present

## 2019-03-26 DIAGNOSIS — Z79890 Hormone replacement therapy (postmenopausal): Secondary | ICD-10-CM

## 2019-03-26 DIAGNOSIS — I639 Cerebral infarction, unspecified: Secondary | ICD-10-CM | POA: Diagnosis present

## 2019-03-26 DIAGNOSIS — Z9181 History of falling: Secondary | ICD-10-CM | POA: Diagnosis not present

## 2019-03-26 DIAGNOSIS — M6281 Muscle weakness (generalized): Secondary | ICD-10-CM | POA: Diagnosis not present

## 2019-03-26 DIAGNOSIS — I350 Nonrheumatic aortic (valve) stenosis: Secondary | ICD-10-CM | POA: Diagnosis present

## 2019-03-26 DIAGNOSIS — R414 Neurologic neglect syndrome: Secondary | ICD-10-CM | POA: Diagnosis not present

## 2019-03-26 DIAGNOSIS — G459 Transient cerebral ischemic attack, unspecified: Secondary | ICD-10-CM | POA: Diagnosis not present

## 2019-03-26 DIAGNOSIS — Z8249 Family history of ischemic heart disease and other diseases of the circulatory system: Secondary | ICD-10-CM

## 2019-03-26 DIAGNOSIS — J9811 Atelectasis: Secondary | ICD-10-CM | POA: Diagnosis not present

## 2019-03-26 DIAGNOSIS — I69391 Dysphagia following cerebral infarction: Secondary | ICD-10-CM | POA: Diagnosis not present

## 2019-03-26 DIAGNOSIS — R4781 Slurred speech: Secondary | ICD-10-CM | POA: Diagnosis not present

## 2019-03-26 DIAGNOSIS — I9589 Other hypotension: Secondary | ICD-10-CM | POA: Diagnosis not present

## 2019-03-26 DIAGNOSIS — Z7401 Bed confinement status: Secondary | ICD-10-CM | POA: Diagnosis not present

## 2019-03-26 DIAGNOSIS — M24412 Recurrent dislocation, left shoulder: Secondary | ICD-10-CM | POA: Diagnosis present

## 2019-03-26 DIAGNOSIS — I480 Paroxysmal atrial fibrillation: Secondary | ICD-10-CM | POA: Diagnosis not present

## 2019-03-26 DIAGNOSIS — R52 Pain, unspecified: Secondary | ICD-10-CM

## 2019-03-26 DIAGNOSIS — I6601 Occlusion and stenosis of right middle cerebral artery: Secondary | ICD-10-CM | POA: Diagnosis not present

## 2019-03-26 DIAGNOSIS — I361 Nonrheumatic tricuspid (valve) insufficiency: Secondary | ICD-10-CM | POA: Diagnosis not present

## 2019-03-26 DIAGNOSIS — Z888 Allergy status to other drugs, medicaments and biological substances status: Secondary | ICD-10-CM

## 2019-03-26 DIAGNOSIS — D649 Anemia, unspecified: Secondary | ICD-10-CM | POA: Diagnosis present

## 2019-03-26 DIAGNOSIS — M25512 Pain in left shoulder: Secondary | ICD-10-CM | POA: Diagnosis not present

## 2019-03-26 DIAGNOSIS — R4701 Aphasia: Secondary | ICD-10-CM | POA: Diagnosis present

## 2019-03-26 DIAGNOSIS — S51811A Laceration without foreign body of right forearm, initial encounter: Secondary | ICD-10-CM | POA: Diagnosis present

## 2019-03-26 DIAGNOSIS — I340 Nonrheumatic mitral (valve) insufficiency: Secondary | ICD-10-CM | POA: Diagnosis not present

## 2019-03-26 DIAGNOSIS — I69354 Hemiplegia and hemiparesis following cerebral infarction affecting left non-dominant side: Secondary | ICD-10-CM | POA: Diagnosis not present

## 2019-03-26 DIAGNOSIS — R6889 Other general symptoms and signs: Secondary | ICD-10-CM | POA: Diagnosis present

## 2019-03-26 DIAGNOSIS — I63411 Cerebral infarction due to embolism of right middle cerebral artery: Secondary | ICD-10-CM | POA: Diagnosis not present

## 2019-03-26 DIAGNOSIS — I69398 Other sequelae of cerebral infarction: Secondary | ICD-10-CM | POA: Diagnosis not present

## 2019-03-26 DIAGNOSIS — Z881 Allergy status to other antibiotic agents status: Secondary | ICD-10-CM

## 2019-03-26 DIAGNOSIS — J9600 Acute respiratory failure, unspecified whether with hypoxia or hypercapnia: Secondary | ICD-10-CM | POA: Diagnosis not present

## 2019-03-26 DIAGNOSIS — J189 Pneumonia, unspecified organism: Secondary | ICD-10-CM

## 2019-03-26 DIAGNOSIS — Z7951 Long term (current) use of inhaled steroids: Secondary | ICD-10-CM

## 2019-03-26 DIAGNOSIS — I69392 Facial weakness following cerebral infarction: Secondary | ICD-10-CM | POA: Diagnosis not present

## 2019-03-26 DIAGNOSIS — R29707 NIHSS score 7: Secondary | ICD-10-CM | POA: Diagnosis present

## 2019-03-26 DIAGNOSIS — M25812 Other specified joint disorders, left shoulder: Secondary | ICD-10-CM | POA: Diagnosis not present

## 2019-03-26 DIAGNOSIS — M2550 Pain in unspecified joint: Secondary | ICD-10-CM | POA: Diagnosis not present

## 2019-03-26 DIAGNOSIS — Z825 Family history of asthma and other chronic lower respiratory diseases: Secondary | ICD-10-CM

## 2019-03-26 HISTORY — PX: RADIOLOGY WITH ANESTHESIA: SHX6223

## 2019-03-26 HISTORY — PX: IR ANGIO VERTEBRAL SEL SUBCLAVIAN INNOMINATE UNI R MOD SED: IMG5365

## 2019-03-26 HISTORY — PX: IR ANGIO INTRA EXTRACRAN SEL COM CAROTID INNOMINATE UNI R MOD SED: IMG5359

## 2019-03-26 LAB — COMPREHENSIVE METABOLIC PANEL
ALT: 14 U/L (ref 0–44)
AST: 29 U/L (ref 15–41)
Albumin: 3.8 g/dL (ref 3.5–5.0)
Alkaline Phosphatase: 57 U/L (ref 38–126)
Anion gap: 9 (ref 5–15)
BUN: 14 mg/dL (ref 8–23)
CO2: 25 mmol/L (ref 22–32)
Calcium: 9.1 mg/dL (ref 8.9–10.3)
Chloride: 100 mmol/L (ref 98–111)
Creatinine, Ser: 0.96 mg/dL (ref 0.44–1.00)
GFR calc Af Amer: >60 mL/min (ref >60)
GFR calc non Af Amer: 54 mL/min — ABNORMAL LOW (ref >60)
Glucose, Bld: 165 mg/dL — ABNORMAL HIGH (ref 70–99)
Potassium: 4.5 mmol/L (ref 3.5–5.1)
Sodium: 134 mmol/L — ABNORMAL LOW (ref 135–145)
Total Bilirubin: 0.9 mg/dL (ref 0.3–1.2)
Total Protein: 6.1 g/dL — ABNORMAL LOW (ref 6.5–8.1)

## 2019-03-26 LAB — DIFFERENTIAL
Abs Immature Granulocytes: 0.07 10*3/uL (ref 0.00–0.07)
Basophils Absolute: 0.1 10*3/uL (ref 0.0–0.1)
Basophils Relative: 0 %
Eosinophils Absolute: 0 10*3/uL (ref 0.0–0.5)
Eosinophils Relative: 0 %
Immature Granulocytes: 0 %
Lymphocytes Relative: 7 %
Lymphs Abs: 1.1 10*3/uL (ref 0.7–4.0)
Monocytes Absolute: 1.1 10*3/uL — ABNORMAL HIGH (ref 0.1–1.0)
Monocytes Relative: 7 %
Neutro Abs: 13.9 10*3/uL — ABNORMAL HIGH (ref 1.7–7.7)
Neutrophils Relative %: 86 %

## 2019-03-26 LAB — POCT I-STAT 7, (LYTES, BLD GAS, ICA,H+H)
Acid-Base Excess: 1 mmol/L (ref 0.0–2.0)
Bicarbonate: 23.6 mmol/L (ref 20.0–28.0)
Calcium, Ion: 1.17 mmol/L (ref 1.15–1.40)
HCT: 37 % (ref 36.0–46.0)
Hemoglobin: 12.6 g/dL (ref 12.0–15.0)
O2 Saturation: 100 %
Patient temperature: 98
Potassium: 4 mmol/L (ref 3.5–5.1)
Sodium: 134 mmol/L — ABNORMAL LOW (ref 135–145)
TCO2: 25 mmol/L (ref 22–32)
pCO2 arterial: 29.9 mmHg — ABNORMAL LOW (ref 32.0–48.0)
pH, Arterial: 7.505 — ABNORMAL HIGH (ref 7.350–7.450)
pO2, Arterial: 154 mmHg — ABNORMAL HIGH (ref 83.0–108.0)

## 2019-03-26 LAB — URINALYSIS, ROUTINE W REFLEX MICROSCOPIC
Bacteria, UA: NONE SEEN
Bilirubin Urine: NEGATIVE
Glucose, UA: NEGATIVE mg/dL
Hgb urine dipstick: NEGATIVE
Ketones, ur: NEGATIVE mg/dL
Nitrite: NEGATIVE
Protein, ur: NEGATIVE mg/dL
Specific Gravity, Urine: 1.019 (ref 1.005–1.030)
pH: 7 (ref 5.0–8.0)

## 2019-03-26 LAB — CBC
HCT: 37.4 % (ref 36.0–46.0)
Hemoglobin: 12.5 g/dL (ref 12.0–15.0)
MCH: 31.3 pg (ref 26.0–34.0)
MCHC: 33.4 g/dL (ref 30.0–36.0)
MCV: 93.5 fL (ref 80.0–100.0)
Platelets: 190 10*3/uL (ref 150–400)
RBC: 4 MIL/uL (ref 3.87–5.11)
RDW: 13.2 % (ref 11.5–15.5)
WBC: 16.2 10*3/uL — ABNORMAL HIGH (ref 4.0–10.5)
nRBC: 0 % (ref 0.0–0.2)

## 2019-03-26 LAB — SARS CORONAVIRUS 2 BY RT PCR (HOSPITAL ORDER, PERFORMED IN ~~LOC~~ HOSPITAL LAB): SARS Coronavirus 2: NEGATIVE

## 2019-03-26 LAB — I-STAT CHEM 8, ED
BUN: 16 mg/dL (ref 8–23)
Calcium, Ion: 1.15 mmol/L (ref 1.15–1.40)
Chloride: 99 mmol/L (ref 98–111)
Creatinine, Ser: 0.9 mg/dL (ref 0.44–1.00)
Glucose, Bld: 158 mg/dL — ABNORMAL HIGH (ref 70–99)
HCT: 38 % (ref 36.0–46.0)
Hemoglobin: 12.9 g/dL (ref 12.0–15.0)
Potassium: 4.5 mmol/L (ref 3.5–5.1)
Sodium: 134 mmol/L — ABNORMAL LOW (ref 135–145)
TCO2: 26 mmol/L (ref 22–32)

## 2019-03-26 LAB — PROTIME-INR
INR: 1 (ref 0.8–1.2)
Prothrombin Time: 13.3 seconds (ref 11.4–15.2)

## 2019-03-26 LAB — RAPID URINE DRUG SCREEN, HOSP PERFORMED
Amphetamines: NOT DETECTED
Barbiturates: NOT DETECTED
Benzodiazepines: NOT DETECTED
Cocaine: NOT DETECTED
Opiates: NOT DETECTED
Tetrahydrocannabinol: NOT DETECTED

## 2019-03-26 LAB — APTT: aPTT: 28 s (ref 24–36)

## 2019-03-26 LAB — ETHANOL: Alcohol, Ethyl (B): <10 mg/dL

## 2019-03-26 LAB — CK: Total CK: 79 U/L (ref 38–234)

## 2019-03-26 SURGERY — RADIOLOGY WITH ANESTHESIA
Anesthesia: General | Laterality: Left

## 2019-03-26 MED ORDER — ASPIRIN 81 MG PO CHEW
CHEWABLE_TABLET | ORAL | Status: AC
  Filled 2019-03-26: qty 1

## 2019-03-26 MED ORDER — CLOPIDOGREL BISULFATE 300 MG PO TABS
ORAL_TABLET | ORAL | Status: AC
  Filled 2019-03-26: qty 1

## 2019-03-26 MED ORDER — EPTIFIBATIDE 20 MG/10ML IV SOLN
INTRAVENOUS | Status: AC
  Filled 2019-03-26: qty 10

## 2019-03-26 MED ORDER — SODIUM CHLORIDE 0.9 % IV SOLN
INTRAVENOUS | Status: DC
  Administered 2019-03-26 – 2019-03-30 (×4): via INTRAVENOUS

## 2019-03-26 MED ORDER — LACTATED RINGERS IV SOLN
INTRAVENOUS | Status: DC | PRN
  Administered 2019-03-26: 21:00:00 via INTRAVENOUS

## 2019-03-26 MED ORDER — AMIODARONE IV BOLUS ONLY 150 MG/100ML
INTRAVENOUS | Status: DC | PRN
  Administered 2019-03-26: 300 mg via INTRAVENOUS

## 2019-03-26 MED ORDER — ORAL CARE MOUTH RINSE
15.0000 mL | OROMUCOSAL | Status: DC
  Administered 2019-03-27 (×8): 15 mL via OROMUCOSAL

## 2019-03-26 MED ORDER — IOHEXOL 300 MG/ML  SOLN
150.0000 mL | Freq: Once | INTRAMUSCULAR | Status: AC | PRN
  Administered 2019-03-26: 22:00:00 35 mL via INTRA_ARTERIAL

## 2019-03-26 MED ORDER — ACETAMINOPHEN 650 MG RE SUPP: 650.0000 mg | RECTAL | Status: DC | PRN

## 2019-03-26 MED ORDER — HYDRALAZINE HCL 20 MG/ML IJ SOLN
INTRAMUSCULAR | Status: AC
  Administered 2019-03-27: 10 mg via INTRAVENOUS
  Filled 2019-03-26: qty 1

## 2019-03-26 MED ORDER — LIDOCAINE HCL (CARDIAC) PF 100 MG/5ML IV SOSY
PREFILLED_SYRINGE | INTRAVENOUS | Status: DC | PRN
  Administered 2019-03-26: 40 mg via INTRATRACHEAL

## 2019-03-26 MED ORDER — SODIUM CHLORIDE 0.9 % IV SOLN
INTRAVENOUS | Status: DC | PRN
  Administered 2019-03-26: 80 ug/min via INTRAVENOUS

## 2019-03-26 MED ORDER — ESMOLOL HCL 100 MG/10ML IV SOLN
INTRAVENOUS | Status: DC | PRN
  Administered 2019-03-26: 10 mg via INTRAVENOUS
  Administered 2019-03-26: 20 mg via INTRAVENOUS
  Administered 2019-03-26: 40 mg via INTRAVENOUS
  Administered 2019-03-26 (×2): 30 mg via INTRAVENOUS

## 2019-03-26 MED ORDER — ACETAMINOPHEN 325 MG PO TABS
650.0000 mg | ORAL_TABLET | ORAL | Status: DC | PRN
  Administered 2019-04-01: 650 mg via ORAL
  Filled 2019-03-26 (×2): qty 2

## 2019-03-26 MED ORDER — SUCCINYLCHOLINE CHLORIDE 20 MG/ML IJ SOLN
INTRAMUSCULAR | Status: DC | PRN
  Administered 2019-03-26: 100 mg via INTRAVENOUS

## 2019-03-26 MED ORDER — HYDRALAZINE HCL 20 MG/ML IJ SOLN
2.0000 mg | INTRAMUSCULAR | Status: DC | PRN
  Administered 2019-03-26: 2 mg via INTRAVENOUS

## 2019-03-26 MED ORDER — VANCOMYCIN HCL IN DEXTROSE 1-5 GM/200ML-% IV SOLN
INTRAVENOUS | Status: AC
  Filled 2019-03-26: qty 200

## 2019-03-26 MED ORDER — FENTANYL CITRATE (PF) 100 MCG/2ML IJ SOLN
25.0000 ug | INTRAMUSCULAR | Status: DC | PRN
  Administered 2019-03-26: 23:00:00 50 ug via INTRAVENOUS

## 2019-03-26 MED ORDER — CLEVIDIPINE BUTYRATE 0.5 MG/ML IV EMUL
0.0000 mg/h | INTRAVENOUS | Status: AC
  Administered 2019-03-26: 21 mg/h via INTRAVENOUS
  Administered 2019-03-26: 23:00:00 4 mg/h via INTRAVENOUS
  Administered 2019-03-27: 21 mg/h via INTRAVENOUS
  Filled 2019-03-26: qty 100

## 2019-03-26 MED ORDER — PROPOFOL 10 MG/ML IV BOLUS
INTRAVENOUS | Status: DC | PRN
  Administered 2019-03-26: 60 mg via INTRAVENOUS

## 2019-03-26 MED ORDER — BISACODYL 10 MG RE SUPP
10.0000 mg | Freq: Every day | RECTAL | Status: DC | PRN
  Administered 2019-04-01: 10 mg via RECTAL
  Filled 2019-03-26 (×2): qty 1

## 2019-03-26 MED ORDER — CLEVIDIPINE BUTYRATE 0.5 MG/ML IV EMUL
INTRAVENOUS | Status: AC
  Filled 2019-03-26: qty 50

## 2019-03-26 MED ORDER — ACETAMINOPHEN 160 MG/5ML PO SOLN: 650.0000 mg | ORAL | Status: DC | PRN

## 2019-03-26 MED ORDER — NITROGLYCERIN 1 MG/10 ML FOR IR/CATH LAB
INTRA_ARTERIAL | Status: AC
  Filled 2019-03-26: qty 10

## 2019-03-26 MED ORDER — PROPOFOL 1000 MG/100ML IV EMUL
0.0000 ug/kg/min | INTRAVENOUS | Status: DC
  Administered 2019-03-27: 5 ug/kg/min via INTRAVENOUS

## 2019-03-26 MED ORDER — TICAGRELOR 90 MG PO TABS
ORAL_TABLET | ORAL | Status: AC
  Filled 2019-03-26: qty 2

## 2019-03-26 MED ORDER — FENTANYL CITRATE (PF) 100 MCG/2ML IJ SOLN
INTRAMUSCULAR | Status: AC
  Administered 2019-03-26: 50 ug via INTRAVENOUS
  Filled 2019-03-26: qty 2

## 2019-03-26 MED ORDER — ACETAMINOPHEN 325 MG PO TABS: 650.0000 mg | ORAL_TABLET | ORAL | Status: DC | PRN

## 2019-03-26 MED ORDER — TIROFIBAN HCL IN NACL 5-0.9 MG/100ML-% IV SOLN
INTRAVENOUS | Status: AC
  Filled 2019-03-26: qty 100

## 2019-03-26 MED ORDER — SENNOSIDES-DOCUSATE SODIUM 8.6-50 MG PO TABS: 1.0000 | ORAL_TABLET | Freq: Every evening | ORAL | Status: DC | PRN

## 2019-03-26 MED ORDER — SODIUM CHLORIDE 0.9 % IV SOLN: INTRAVENOUS | Status: DC

## 2019-03-26 MED ORDER — CHLORHEXIDINE GLUCONATE CLOTH 2 % EX PADS
6.0000 | MEDICATED_PAD | Freq: Every day | CUTANEOUS | Status: DC
  Administered 2019-03-27 – 2019-04-01 (×6): 6 via TOPICAL

## 2019-03-26 MED ORDER — LABETALOL HCL 5 MG/ML IV SOLN
INTRAVENOUS | Status: AC
  Administered 2019-03-26: 20 mg
  Filled 2019-03-26: qty 4

## 2019-03-26 MED ORDER — CHLORHEXIDINE GLUCONATE 0.12% ORAL RINSE (MEDLINE KIT)
15.0000 mL | Freq: Two times a day (BID) | OROMUCOSAL | Status: DC
  Administered 2019-03-26 – 2019-03-27 (×2): 15 mL via OROMUCOSAL

## 2019-03-26 MED ORDER — STROKE: EARLY STAGES OF RECOVERY BOOK
Freq: Once | Status: AC
  Administered 2019-03-26
  Filled 2019-03-26: qty 1

## 2019-03-26 MED ORDER — FENTANYL CITRATE (PF) 100 MCG/2ML IJ SOLN: 25.0000 ug | INTRAMUSCULAR | Status: DC | PRN

## 2019-03-26 MED ORDER — ROCURONIUM BROMIDE 100 MG/10ML IV SOLN
INTRAVENOUS | Status: DC | PRN
  Administered 2019-03-26: 50 mg via INTRAVENOUS

## 2019-03-26 MED ORDER — PANTOPRAZOLE SODIUM 40 MG IV SOLR
40.0000 mg | Freq: Every day | INTRAVENOUS | Status: DC
  Administered 2019-03-27: 40 mg via INTRAVENOUS
  Filled 2019-03-26: qty 40

## 2019-03-26 MED ORDER — IOHEXOL 350 MG/ML SOLN
100.0000 mL | Freq: Once | INTRAVENOUS | Status: AC | PRN
  Administered 2019-03-26: 100 mL via INTRAVENOUS

## 2019-03-26 NOTE — ED Notes (Signed)
Anesthesia at bedside intubating

## 2019-03-26 NOTE — Progress Notes (Signed)
eLink Physician-Brief Progress Note Patient Name: Nancy Zamora DOB: Nov 20, 1930 MRN: [id number]   Date of Service  03/26/2019  HPI/Events of Note  Pt is s/p right MCA territory CVA with unsuccessful thrombectomy due to distal location of clot. She went into SVT peri-operatively and was started on an Amiodarone infusion. She is intubated and on the ventilator.  eICU Interventions  New patient evaluation completed. PCCM on the ground requested to consult on patient.        Kerry Kass Smaran Gaus 03/26/2019, 10:40 PM

## 2019-03-26 NOTE — H&P (Addendum)
NEURO HOSPITALIST H & P   Requesting physician: Dr. Tyrone Nine  Reason for Consult: Acute stroke symptoms  History obtained from:  Patient, Son and Chart    HPI:                                                                                                                                          Nancy Zamora is an 85 y.o. female who lives in a townhome independently with an mRS of 0. She lives alone and was LKN per one report at 1100 on Tuesday. Her son, who is the only informant present, states that he spoke with her on the phone at about 9 PM last night and that she was conversing normally with no complaints. The patient tells team that at 1100 today she fell from a standing position. Her son later came by her home at 5 PM and noted that she had slurred speech, left sided facial droop, right gaze preference and left arm weakness. EMS was called and she was brought to the ED emergently. Code Stroke was called in the ED.   NIHSS of 7 was obtained during CT.   PMHx Dry eye and blepharitis Nonrheumatic aortic valve stenosis HTN CKD3 Left rotator cuff tear arthropathy Right rotator cuff arthropathy s/p RSA 2014 Chronic cough Osteoarthritis Osteopenia/Osteoporosis Paroxysmal SVT and PVCs Peripheral neuropathy Anemia Diverticulitis Hypothyroidism  PSHx   FHx COPD, stroke, asthma, heart disease, cataracts, glaucoma and macular degeneration            Social History:  reports that she has never smoked. She has never used smokeless tobacco. No history on file for alcohol and drug.  Allergies Adhesive tape Chocolate flavor Gabapentin Tetracycline  HOME MEDICATIONS:                                                                                                                     Cardizem CD 180 mg po qd Prolia 1 mL injection on 9/2 Synthroid 112 mcg, 1.5 tablets po qd 2 days per week, 1 tablet po 5 days per week Vitamin D2 Cozaar 25 mg po qd Prilosec 40 mg  po BID Iprattopium 42 mcg 2 sprays PRN Acyclovir 800 mg po TID Artificial tears PRN Genteal gel opht PRN Miralax 17 g po qd  Vitamin C MVI    ROS:                                                                                                                                       No headache, CP, SOB, fever, chills. Has right shoulder pain. Denies any other symptoms on comprehensive ROS. Unaware of any left sided neurological symptoms due to anosognosia.  Blood pressure (!) 160/85, pulse 89, temperature 98.3 F (36.8 C), temperature source Oral, resp. rate 14, SpO2 99 %.   General Examination:                                                                                                       Physical Exam  HEENT-  [place]/AT .   Lungs- Respirations unlabored   Extremities- Warm and well perfused  Neurological Examination Mental Status: Alert, fully oriented except to city. Left hemineglect and anosognosia. Speech is dysarthric but fluent. Able to name all objects presented. Repetition moderately impaired. Able to follow all commands. Cranial Nerves: II: Visual fields intact bilaterally with all 4 quadrants tested individually. There is extinction on the left to DSS. PERRL.  III,IV, VI: No ptosis not present. EOMI without nystagmus.  V,VII: Left facial droop. Facial FT sensation subjectively normal bilaterally VIII: Hearing intact to voice IX,X: Mild pharyngeal dysarthria XI: Head preferentially rotated to the right XII: midline tongue extension Motor: RUE and RLE 5/5 LUE 2/5 proximally and distally LLE 4-/5 proximally and distally  Sensory: Light touch intact BUE with extinction on the left. LT decreased to LLE. Extinction on left also noted with lower extremity DSS.  Deep Tendon Reflexes: 2+ RUE, 1+ LUE. 2+ right patellar, 1+ left patellar. Toes upgoing bilaterally.  Cerebellar: No ataxia to RUE. Unable to test LUE.  Gait: Deferred   Lab Results: Basic Metabolic Panel: No  results for input(s): NA, K, CL, CO2, GLUCOSE, BUN, CREATININE, CALCIUM, MG, PHOS in the last 168 hours.  CBC: No results for input(s): WBC, NEUTROABS, HGB, HCT, MCV, PLT in the last 168 hours.  Cardiac Enzymes: No results for input(s): CKTOTAL, CKMB, CKMBINDEX, TROPONINI in the last 168 hours.  Lipid Panel: No results for input(s): CHOL, TRIG, HDL, CHOLHDL, VLDL, LDLCALC in the last 168 hours.  Imaging:  CT head: 1. No intracranial hemorrhage. 2. ASPECTS is 8 with early hypoattenuation seen in the right basal ganglia and sub-insular white matter on the right  CTA of head and neck with CTP: 1. Occlusion of a mid-distal  right M2 branch, corresponding to the area of ischemia on the perfusion scan. These results were called by telephone at the time of interpretation on 03/26/2019 at 7:56 pm to Dr.Caitlin Ainley [facility] , who verbally acknowledged these results. 2. 5 mL right MCA territory infarction, predominantly in the right frontal operculum, with 22 mL area of ischemic penumbra. 3. No hemodynamically significant stenosis of the carotid or vertebral arteries by the NASCET criteria.  EKG: Sinus rhythm Multiple premature complexes, vent & supraven Minimal ST depression, lateral leads  Assessment: 85 year old female with acute right MCA stroke, most likely embolic. Exam with left sided neglect, rightward eye deviation, LUE and LLE weakness, left sided sensory deficit and left facial droop with dysarthria. NIHSS of 7.  1. The patient is not a tPA candidate due to time criteria.  2. The patient is a VIR candidate. Discussed with son extensively the risks/benefits of proceeding with VIR and possible clot retrieval, versus risks/benefits of no intervention. The patient's son provided informed consent for VIR, intubation and anesthesia after all questions were answered. Due to her anosognosia, the patient is unable to provide informed consent.    Reccommedations: 1. Being transported emergently to  [place].  2. Admitting to the ICU under the Neurology service following VIR.  3. Will obtain MRI brain and TTE.  4. Cardiac telemetry.  5. Post-VIR orders and BP parameters per Dr. Estanislado Pandy 6. On amiodarone gtt for sustained episode of SVT episode during VIR. HR following procedure is 113 with BP 130s/70s. Will consult CCM for amiodarone drip management.   Addendum: VIR team unable to proceed to clot retrieval due to its distal location as well as occurrence of sustained SVT during the angiogram. The patient is stable under sedation following VIR. Discussed prognosis with son. Also discussed treatment plan with CCM. Although patient had no complaints of neck pain or head/neck trauma due to her fall when assessed in the ED, we will obtain a CT neck to definitively rule out possible fracture in the context of her osteoporosis and osteoarthritis diagnoses. Amiodarone drip has been discontinued due to risk of severe hypotension given that she is sedated with propofol. Her HR has normalized. Per son, she has had one prior episode of SVT in the past which was thought to be a side effect of anesthesia.   90 minutes spent in the emergent neurological evaluation and management of this critically ill patient.    Electronically signed: Dr. Kerney Elbe 03/26/2019, 7:10 PM

## 2019-03-26 NOTE — ED Provider Notes (Signed)
[facility] EMERGENCY DEPARTMENT Provider Note   CSN: [id number] Arrival date & time: 03/26/19  [id number]     History   Chief Complaint Chief Complaint  Patient presents with   Aphasia    HPI Shamelia Zafar is a 85 y.o. female.     85 yo F with a chief complaint of a fall.  This was unwitnessed and happened at some point her home.  Patient lives alone.  She is a bit confused about what exactly happened.  Family checks on her frequently throughout the day though were busy earlier today and so last time they called her was last night about 10 PM.  They spoke with a neighbor who said they talked with her about 11:30 AM and that she was normal at that time.  When they found her on the floor they noted that she was slurring her speech and that she was weak on the left side.  She does have some chronic left upper extremity weakness due to recurrent fractures of the shoulder.  Level 5 caveat acuity of condition.  The history is provided by the patient and the EMS personnel.  Illness Severity:  Severe Onset quality:  Sudden Duration:  8 hours Timing:  Constant Progression:  Unchanged Chronicity:  New Associated symptoms: no chest pain, no congestion, no fever, no headaches, no myalgias, no nausea, no rhinorrhea, no shortness of breath, no vomiting and no wheezing     History reviewed. No pertinent past medical history.  There are no active problems to display for this patient.   History reviewed. No pertinent surgical history.   OB History   No obstetric history on file.      Home Medications    Prior to Admission medications   Medication Sig Start Date End Date Taking? Authorizing Provider  celecoxib (CELEBREX) 100 MG capsule Take 100 mg by mouth 2 (two) times daily.   Yes [provider]  esomeprazole (NEXIUM) 40 MG capsule Take 40 mg by mouth daily at 12 noon.   Yes [provider]  ipratropium (ATROVENT) 0.06 % nasal spray Place 2 sprays  into both nostrils 4 (four) times daily as needed for rhinitis.   Yes [provider]  meclizine (ANTIVERT) 12.5 MG tablet Take 12.5 mg by mouth 3 (three) times daily as needed for dizziness.   Yes [provider]  mometasone (NASONEX) 50 MCG/ACT nasal spray Place 2 sprays into the nose daily.   Yes [provider]  RABEprazole (ACIPHEX) 20 MG tablet Take 20 mg by mouth daily.   Yes [provider]  triamcinolone cream (KENALOG) 0.1 % Apply 1 application topically 2 (two) times daily.   Yes [provider]    Family History History reviewed. No pertinent family history.  Social History Social History   Tobacco Use   Smoking status: Never Smoker   Smokeless tobacco: Never Used  Substance Use Topics   Alcohol use: Not on file   Drug use: Not on file     Allergies   Chocolate, Gabapentin, Penicillins, and Tetracyclines & related   Review of Systems Review of Systems  Constitutional: Negative for chills and fever.  HENT: Negative for congestion and rhinorrhea.   Eyes: Negative for redness and visual disturbance.  Respiratory: Negative for shortness of breath and wheezing.   Cardiovascular: Negative for chest pain and palpitations.  Gastrointestinal: Negative for nausea and vomiting.  Genitourinary: Negative for dysuria and urgency.  Musculoskeletal: Negative for arthralgias and myalgias.  Skin: Negative for pallor and wound.  Neurological: Positive for facial asymmetry and weakness. Negative for dizziness and headaches.     Physical Exam Updated Vital Signs BP (!) 161/136    Pulse 93    Temp 98.3 F (36.8 C) (Oral)    Resp 20    SpO2 97%   Physical Exam Vitals signs and nursing note reviewed.  Constitutional:      General: She is not in acute distress.    Appearance: She is well-developed. She is not diaphoretic.  HENT:     Head: Normocephalic and atraumatic.  Eyes:     Pupils: Pupils are equal, round, and reactive to  light.  Neck:     Musculoskeletal: Normal range of motion and neck supple.  Cardiovascular:     Rate and Rhythm: Normal rate and regular rhythm.     Heart sounds: No murmur. No friction rub. No gallop.   Pulmonary:     Effort: Pulmonary effort is normal.     Breath sounds: No wheezing or rales.  Abdominal:     General: There is no distension.     Palpations: Abdomen is soft.     Tenderness: There is no abdominal tenderness.  Musculoskeletal:        General: No tenderness.  Skin:    General: Skin is warm and dry.  Neurological:     Mental Status: She is alert and oriented to person, place, and time.     Comments: Patient is mildly confused on exam.  She has right-sided gaze deviation.  Left-sided weakness worse to the upper extremity.  She has some left-sided facial droop.  Psychiatric:        Behavior: Behavior normal.      ED Treatments / Results  Labs (all labs ordered are listed, but only abnormal results are displayed) Labs Reviewed  CBC - Abnormal; Notable for the following components:      Result Value   WBC 16.2 (*)    All other components within normal limits  DIFFERENTIAL - Abnormal; Notable for the following components:   Neutro Abs 13.9 (*)    Monocytes Absolute 1.1 (*)    All other components within normal limits  COMPREHENSIVE METABOLIC PANEL - Abnormal; Notable for the following components:   Sodium 134 (*)    Glucose, Bld 165 (*)    Total Protein 6.1 (*)    GFR calc non Af Amer 54 (*)    All other components within normal limits  I-STAT CHEM 8, ED - Abnormal; Notable for the following components:   Sodium 134 (*)    Glucose, Bld 158 (*)    All other components within normal limits  SARS CORONAVIRUS 2 (HOSPITAL ORDER, [place] [facility])  ETHANOL  PROTIME-INR  APTT  RAPID URINE DRUG SCREEN, HOSP PERFORMED  URINALYSIS, ROUTINE W REFLEX MICROSCOPIC  CK    EKG EKG Interpretation  Date/Time:  Wednesday March 26 2019 18:58:11  EDT Ventricular Rate:  77 PR Interval:    QRS Duration: 88 QT Interval:  387 QTC Calculation: 438 R Axis:   75 Text Interpretation:  Sinus rhythm Multiple premature complexes, vent & supraven Minimal ST depression, lateral leads No old tracing to compare Confirmed by Deno Etienne [phone]) on 03/26/2019 7:43:46 PM   Radiology Ct Code Stroke Cta Head W/wo Contrast  Result Date: 03/26/2019 CLINICAL DATA:  Left-sided weakness EXAM: CT ANGIOGRAPHY HEAD AND NECK CT PERFUSION BRAIN TECHNIQUE: Multidetector CT imaging of the head and neck  was performed using the standard protocol during bolus administration of intravenous contrast. Multiplanar CT image reconstructions and MIPs were obtained to evaluate the vascular anatomy. Carotid stenosis measurements (when applicable) are obtained utilizing NASCET criteria, using the distal internal carotid diameter as the denominator. Multiphase CT imaging of the brain was performed following IV bolus contrast injection. Subsequent parametric perfusion maps were calculated using RAPID software. CONTRAST:  134mL OMNIPAQUE IOHEXOL 350 MG/ML SOLN COMPARISON:  Head CT 03/26/2019 FINDINGS: CTA NECK FINDINGS SKELETON: Multilevel degenerative disc disease and facet hypertrophy without bony spinal canal stenosis. OTHER NECK: Normal pharynx, larynx and major salivary glands. No cervical lymphadenopathy. Unremarkable thyroid gland. UPPER CHEST: No pneumothorax or pleural effusion. No nodules or masses. AORTIC ARCH: There is mild calcific atherosclerosis of the aortic arch. There is no aneurysm, dissection or hemodynamically significant stenosis of the visualized ascending aorta and aortic arch. Normal variant aortic arch branching pattern with the brachiocephalic and left common carotid arteries sharing a common origin. The visualized proximal subclavian arteries are widely patent. RIGHT CAROTID SYSTEM: --Common carotid artery: Widely patent origin without common carotid artery dissection  or aneurysm. --Internal carotid artery: No dissection, occlusion or aneurysm. Mild atherosclerotic calcification at the carotid bifurcation without hemodynamically significant stenosis. --External carotid artery: No acute abnormality. LEFT CAROTID SYSTEM: --Common carotid artery: Widely patent origin without common carotid artery dissection or aneurysm. --Internal carotid artery: No dissection, occlusion or aneurysm. Mild atherosclerotic calcification at the carotid bifurcation without hemodynamically significant stenosis. --External carotid artery: No acute abnormality. VERTEBRAL ARTERIES: Left dominant configuration. Both origins are clearly patent. No dissection, occlusion or flow-limiting stenosis to the skull base (V1-V3 segments). CTA HEAD FINDINGS POSTERIOR CIRCULATION: --Vertebral arteries: The right vertebral artery terminates in PICA. Left vertebral artery V4 segment is normal. --Posterior inferior cerebellar arteries (PICA): Normal --Anterior inferior cerebellar arteries (AICA): Patent origins from the basilar artery. --Basilar artery: Normal. --Superior cerebellar arteries: Normal. --Posterior cerebral arteries: Normal. The right PCA is predominantly supplied by the posterior communicating artery. ANTERIOR CIRCULATION: --Intracranial internal carotid arteries: Normal. --Anterior cerebral arteries (ACA): Normal. Both A1 segments are present. Patent anterior communicating artery (a-comm). --Middle cerebral arteries (MCA): On the right, there is occlusion of a mid to distal right M2 branch (series 11, image 24 and series 12, image 12). VENOUS SINUSES: As permitted by contrast timing, patent. ANATOMIC VARIANTS: None Review of the MIP images confirms the above findings. CT Brain Perfusion Findings: ASPECTS: 8 CBF (<30%) Volume: 39mL Perfusion (Tmax>6.0s) volume: 79mL Mismatch Volume: 45mL Infarction Location:Right MCA territory, predominantly the right frontal operculum IMPRESSION: 1. Occlusion of a mid-distal  right M2 branch, corresponding to the area of ischemia on the perfusion scan. These results were called by telephone at the time of interpretation on 03/26/2019 at 7:56 pm to Dr.ERIC [facility] , who verbally acknowledged these results. 2. 5 mL right MCA territory infarction, predominantly in the right frontal operculum, with 22 mL area of ischemic penumbra. 3. No hemodynamically significant stenosis of the carotid or vertebral arteries by the NASCET criteria. Aortic Atherosclerosis (ICD10-I70.0). Electronically Signed   By: Ulyses Jarred M.D.   On: 03/26/2019 20:07   Ct Code Stroke Cta Neck W/wo Contrast  Result Date: 03/26/2019 CLINICAL DATA:  Left-sided weakness EXAM: CT ANGIOGRAPHY HEAD AND NECK CT PERFUSION BRAIN TECHNIQUE: Multidetector CT imaging of the head and neck was performed using the standard protocol during bolus administration of intravenous contrast. Multiplanar CT image reconstructions and MIPs were obtained to evaluate the vascular anatomy. Carotid stenosis measurements (when applicable) are  obtained utilizing NASCET criteria, using the distal internal carotid diameter as the denominator. Multiphase CT imaging of the brain was performed following IV bolus contrast injection. Subsequent parametric perfusion maps were calculated using RAPID software. CONTRAST:  11mL OMNIPAQUE IOHEXOL 350 MG/ML SOLN COMPARISON:  Head CT 03/26/2019 FINDINGS: CTA NECK FINDINGS SKELETON: Multilevel degenerative disc disease and facet hypertrophy without bony spinal canal stenosis. OTHER NECK: Normal pharynx, larynx and major salivary glands. No cervical lymphadenopathy. Unremarkable thyroid gland. UPPER CHEST: No pneumothorax or pleural effusion. No nodules or masses. AORTIC ARCH: There is mild calcific atherosclerosis of the aortic arch. There is no aneurysm, dissection or hemodynamically significant stenosis of the visualized ascending aorta and aortic arch. Normal variant aortic arch branching pattern with the  brachiocephalic and left common carotid arteries sharing a common origin. The visualized proximal subclavian arteries are widely patent. RIGHT CAROTID SYSTEM: --Common carotid artery: Widely patent origin without common carotid artery dissection or aneurysm. --Internal carotid artery: No dissection, occlusion or aneurysm. Mild atherosclerotic calcification at the carotid bifurcation without hemodynamically significant stenosis. --External carotid artery: No acute abnormality. LEFT CAROTID SYSTEM: --Common carotid artery: Widely patent origin without common carotid artery dissection or aneurysm. --Internal carotid artery: No dissection, occlusion or aneurysm. Mild atherosclerotic calcification at the carotid bifurcation without hemodynamically significant stenosis. --External carotid artery: No acute abnormality. VERTEBRAL ARTERIES: Left dominant configuration. Both origins are clearly patent. No dissection, occlusion or flow-limiting stenosis to the skull base (V1-V3 segments). CTA HEAD FINDINGS POSTERIOR CIRCULATION: --Vertebral arteries: The right vertebral artery terminates in PICA. Left vertebral artery V4 segment is normal. --Posterior inferior cerebellar arteries (PICA): Normal --Anterior inferior cerebellar arteries (AICA): Patent origins from the basilar artery. --Basilar artery: Normal. --Superior cerebellar arteries: Normal. --Posterior cerebral arteries: Normal. The right PCA is predominantly supplied by the posterior communicating artery. ANTERIOR CIRCULATION: --Intracranial internal carotid arteries: Normal. --Anterior cerebral arteries (ACA): Normal. Both A1 segments are present. Patent anterior communicating artery (a-comm). --Middle cerebral arteries (MCA): On the right, there is occlusion of a mid to distal right M2 branch (series 11, image 24 and series 12, image 12). VENOUS SINUSES: As permitted by contrast timing, patent. ANATOMIC VARIANTS: None Review of the MIP images confirms the above  findings. CT Brain Perfusion Findings: ASPECTS: 8 CBF (<30%) Volume: 62mL Perfusion (Tmax>6.0s) volume: 42mL Mismatch Volume: 71mL Infarction Location:Right MCA territory, predominantly the right frontal operculum IMPRESSION: 1. Occlusion of a mid-distal right M2 branch, corresponding to the area of ischemia on the perfusion scan. These results were called by telephone at the time of interpretation on 03/26/2019 at 7:56 pm to Dr.ERIC [facility] , who verbally acknowledged these results. 2. 5 mL right MCA territory infarction, predominantly in the right frontal operculum, with 22 mL area of ischemic penumbra. 3. No hemodynamically significant stenosis of the carotid or vertebral arteries by the NASCET criteria. Aortic Atherosclerosis (ICD10-I70.0). Electronically Signed   By: Ulyses Jarred M.D.   On: 03/26/2019 20:07   Ct Code Stroke Cta Cerebral Perfusion W/wo Contrast  Result Date: 03/26/2019 CLINICAL DATA:  Left-sided weakness EXAM: CT ANGIOGRAPHY HEAD AND NECK CT PERFUSION BRAIN TECHNIQUE: Multidetector CT imaging of the head and neck was performed using the standard protocol during bolus administration of intravenous contrast. Multiplanar CT image reconstructions and MIPs were obtained to evaluate the vascular anatomy. Carotid stenosis measurements (when applicable) are obtained utilizing NASCET criteria, using the distal internal carotid diameter as the denominator. Multiphase CT imaging of the brain was performed following IV bolus contrast injection. Subsequent parametric perfusion maps  were calculated using RAPID software. CONTRAST:  169mL OMNIPAQUE IOHEXOL 350 MG/ML SOLN COMPARISON:  Head CT 03/26/2019 FINDINGS: CTA NECK FINDINGS SKELETON: Multilevel degenerative disc disease and facet hypertrophy without bony spinal canal stenosis. OTHER NECK: Normal pharynx, larynx and major salivary glands. No cervical lymphadenopathy. Unremarkable thyroid gland. UPPER CHEST: No pneumothorax or pleural effusion. No nodules  or masses. AORTIC ARCH: There is mild calcific atherosclerosis of the aortic arch. There is no aneurysm, dissection or hemodynamically significant stenosis of the visualized ascending aorta and aortic arch. Normal variant aortic arch branching pattern with the brachiocephalic and left common carotid arteries sharing a common origin. The visualized proximal subclavian arteries are widely patent. RIGHT CAROTID SYSTEM: --Common carotid artery: Widely patent origin without common carotid artery dissection or aneurysm. --Internal carotid artery: No dissection, occlusion or aneurysm. Mild atherosclerotic calcification at the carotid bifurcation without hemodynamically significant stenosis. --External carotid artery: No acute abnormality. LEFT CAROTID SYSTEM: --Common carotid artery: Widely patent origin without common carotid artery dissection or aneurysm. --Internal carotid artery: No dissection, occlusion or aneurysm. Mild atherosclerotic calcification at the carotid bifurcation without hemodynamically significant stenosis. --External carotid artery: No acute abnormality. VERTEBRAL ARTERIES: Left dominant configuration. Both origins are clearly patent. No dissection, occlusion or flow-limiting stenosis to the skull base (V1-V3 segments). CTA HEAD FINDINGS POSTERIOR CIRCULATION: --Vertebral arteries: The right vertebral artery terminates in PICA. Left vertebral artery V4 segment is normal. --Posterior inferior cerebellar arteries (PICA): Normal --Anterior inferior cerebellar arteries (AICA): Patent origins from the basilar artery. --Basilar artery: Normal. --Superior cerebellar arteries: Normal. --Posterior cerebral arteries: Normal. The right PCA is predominantly supplied by the posterior communicating artery. ANTERIOR CIRCULATION: --Intracranial internal carotid arteries: Normal. --Anterior cerebral arteries (ACA): Normal. Both A1 segments are present. Patent anterior communicating artery (a-comm). --Middle cerebral  arteries (MCA): On the right, there is occlusion of a mid to distal right M2 branch (series 11, image 24 and series 12, image 12). VENOUS SINUSES: As permitted by contrast timing, patent. ANATOMIC VARIANTS: None Review of the MIP images confirms the above findings. CT Brain Perfusion Findings: ASPECTS: 8 CBF (<30%) Volume: 82mL Perfusion (Tmax>6.0s) volume: 25mL Mismatch Volume: 52mL Infarction Location:Right MCA territory, predominantly the right frontal operculum IMPRESSION: 1. Occlusion of a mid-distal right M2 branch, corresponding to the area of ischemia on the perfusion scan. These results were called by telephone at the time of interpretation on 03/26/2019 at 7:56 pm to Dr.ERIC [facility] , who verbally acknowledged these results. 2. 5 mL right MCA territory infarction, predominantly in the right frontal operculum, with 22 mL area of ischemic penumbra. 3. No hemodynamically significant stenosis of the carotid or vertebral arteries by the NASCET criteria. Aortic Atherosclerosis (ICD10-I70.0). Electronically Signed   By: Ulyses Jarred M.D.   On: 03/26/2019 20:07   Ct Head Code Stroke Wo Contrast  Result Date: 03/26/2019 CLINICAL DATA:  Code stroke.  Slurred speech EXAM: CT HEAD WITHOUT CONTRAST TECHNIQUE: Contiguous axial images were obtained from the base of the skull through the vertex without intravenous contrast. COMPARISON:  None. FINDINGS: Brain: There is no mass, hemorrhage or extra-axial collection. The size and configuration of the ventricles and extra-axial CSF spaces are normal. There is hypoattenuation of the periventricular white matter, most commonly indicating chronic ischemic microangiopathy. Vascular: No abnormal hyperdensity of the major intracranial arteries or dural venous sinuses. No intracranial atherosclerosis. Skull: The visualized skull base, calvarium and extracranial soft tissues are normal. Sinuses/Orbits: No fluid levels or advanced mucosal thickening of the visualized paranasal  sinuses. No mastoid or  middle ear effusion. The orbits are normal. ASPECTS [facility] Stroke Program Early CT Score) - Ganglionic level infarction (caudate, lentiform nuclei, internal capsule, insula, M1-M3 cortex): 5 - Supraganglionic infarction (M4-M6 cortex): 3 Total score (0-10 with 10 being normal): 8 IMPRESSION: 1. No intracranial hemorrhage. 2. ASPECTS is 8 These results were called by telephone at the time of interpretation on 03/26/2019 at 7:46 pm to provider Dr. Kerney Elbe, who verbally acknowledged these results. Electronically Signed   By: Ulyses Jarred M.D.   On: 03/26/2019 19:48    Procedures Procedures (including critical care time)  Medications Ordered in ED Medications  iohexol (OMNIPAQUE) 350 MG/ML injection 100 mL (100 mLs Intravenous Contrast Given 03/26/19 1930)     Initial Impression / Assessment and Plan / ED Course  I have reviewed the triage vital signs and the nursing notes.  Pertinent labs & imaging results that were available during my care of the patient were reviewed by me and considered in my medical decision making (see chart for details).        85 yo F with a chief complaints of a fall.  When she was found on the ground she was noted to be slurring her speech having some difficulty with word finding and right-sided gaze deviation.  Upon my examination I had a discussion with the neurologist to agreed to make her a code LVO.  She was taken urgently back to CT.  Found to have a Right M2 MCA occlusion.  Will have intervention.   CRITICAL CARE Performed by: Cecilio Asper   Total critical care time: 35 minutes  Critical care time was exclusive of separately billable procedures and treating other patients.  Critical care was necessary to treat or prevent imminent or life-threatening deterioration.  Critical care was time spent personally by me on the following activities: development of treatment plan with patient and/or surrogate as well as nursing,  discussions with consultants, evaluation of patient's response to treatment, examination of patient, obtaining history from patient or surrogate, ordering and performing treatments and interventions, ordering and review of laboratory studies, ordering and review of radiographic studies, pulse oximetry and re-evaluation of patient's condition.   Final Clinical Impressions(s) / ED Diagnoses   Final diagnoses:  Acute ischemic right MCA stroke [facility])    ED Discharge Orders    None       Deno Etienne, DO 03/26/19 2046

## 2019-03-26 NOTE — Procedures (Signed)
S/p RT common carotid and RT Vert arteriogram RT CFA approach Findings. 1Occlusion of RT MCA superior division ant peri sylvian branch M2/M3 junction.  S.Sunya Humbarger MD

## 2019-03-26 NOTE — Consult Note (Signed)
NAME:  Nancy Zamora, MRN:  [id number], DOB:  1931-04-02, LOS: 0 ADMISSION DATE:  03/26/2019, CONSULTATION DATE:  03/26/2019 REFERRING MD:  Dr. Cheral Marker, CHIEF COMPLAINT:  Stroke  Brief History   85 year old female presenting from home after being found on floor with slurred speech, left facial droop, right gaze, and left sided weakness found to have acute right M2/M3 occlusion.  Intubated for procedure.  Post intubation patient had SVT treated with amiodarone by anesthesia.  Taken for EVR however clot was too distal for safe intervention.  Patient returned to ICU intubated.    History of present illness   HPI obtained from medical chart review as patient is intubated and sedated on mechanical ventilation.   85 year old female with past medial history as below, who lives alone independently.  Family found her on the floor today with slurred speech and left sided weakness.  LKW last evening when family spoke to her by phone around 2200.  In ER, code stroke activated.  Out of window for tPA. Patient had reported falling and was alert, with slurred speech, left facial droop, right gaze, and left sided weakness with neglect.  NIH 8.  CT head negative for acute changes.  CTA head/ neck and perfusion study noted for right distal M2 occlusion.  Patient was intubated in preparation to be taken to neuro IR.  After intubation, patient had sustained SVT which was treated with amiodarone by anesthesia.  In IR, found to found to have acute occlusion of right MCA at M2/ M3 junction however intervention not able to be safely performed given how distal clot was.  Patient returned to ICU on MV, PCCM consulted for ventilator management.   Past Medical History   Nonrheumatic aortic valve stenosis HTN CKD3 Left rotator cuff tear arthropathy Right rotator cuff arthropathy s/p RSA 2014 Chronic cough Osteoarthritis Severe Osteopenia/Osteoporosis Paroxysmal SVT and PVCs Peripheral neuropathy Anemia Diverticulitis  Hypothyroidism Dry eye and blepharitis  Significant Hospital Events   9/9 Admit  Consults:  Neuro IR PCCM  Procedures:  9/9 ETT >> 9/9 R femoral aline >>  Significant Diagnostic Tests:  9/9 CTH >> 1. No intracranial hemorrhage. 2. ASPECTS is 8  9/9 CTA head/ neck /  Cerebral perfusion >> 1. Occlusion of a mid-distal right M2 branch, corresponding to the area of ischemia on the perfusion scan. These results were called by telephone at the time of interpretation on 03/26/2019 at 7:56 pm to Dr.ERIC [facility] , who verbally acknowledged these results. 2. 5 mL right MCA territory infarction, predominantly in the right frontal operculum, with 22 mL area of ischemic penumbra. 3. No hemodynamically significant stenosis of the carotid or vertebral arteries by the NASCET criteria. Aortic Atherosclerosis   9/9 cerebral angiogram  Micro Data:  9/9 SARS Cov2 >> neg 9/9 MRSA PCR >> 9/9 UC >>  Antimicrobials:   Interim history/subjective:   Objective   Blood pressure (!) 168/105, pulse 93, temperature 98.3 F (36.8 C), temperature source Oral, resp. rate 16, height 5\' 2"  (1.575 m), SpO2 100 %.    Vent Mode: PRVC FiO2 (%):  [60 %] 60 % Set Rate:  [15 bmp] 15 bmp Vt Set:  [400 mL] 400 mL PEEP:  [5 cmH20] 5 cmH20 Plateau Pressure:  [13 cmH20] 13 cmH20   Intake/Output Summary (Last 24 hours) at 03/26/2019 2300 Last data filed at 03/26/2019 2255 Gross per 24 hour  Intake 300 ml  Output 25 ml  Net 275 ml   There were no  vitals filed for this visit.  Examination: General:  Elderly female paralyzed on MV HEENT: MM pink/moist, ETT at 20 at lip, pupils 4/reactive Neuro: unable to assess, s/p paralytics  CV: RR, no murmur, right groin site wnl/  PULM:  MV supported breaths, clear throughout, no wheeze GI: soft, bs active, purwick Extremities: warm/dry, no LE edema  Skin: tan, skin tear to right FA, no rashes   Resolved Hospital Problem list    Assessment & Plan:   Acute  Right M2/ M3 stroke - LSW 9/8 ~2200, EVR attempted however clot too distal for safe intervention P:  Neurology primary MRI brain pending Serial Neuro exams Stroke workup ordered/ pending - TTE, lipid, A1c PT/ OT after extubation  Acute respiratory insufficiency P:  Full MV support, PRVC 8 cc/kg, rate 15 Wean FiO2 / peep for sat goal >92% CXR/ ABG now VAP/ PPI  Likely can be weaned/ extubated in am after sheath pulled Daily SBT SLP post extubation PAD protocol with propofol and prn fentanyl for RASS goal 0/-1  Reported fall by patient in ER, found on floor - hx of severe osteoporosis on prolia  - CK  28 - LSW yest.  Possibly on floor since 1100 am today  P:  Cspine collar and precautions now CT cervical when goes to MRI Recheck CK in am for completeness   SVT after intubation - son reports prior episode of SVT with anesthesia - s/p amiodarone by anesthesia - intinial EKG non acute, SR w/PVC P:  SR now, tele monitor  K wnl, check mag Checking TSH For completeness, trend hs trop/ EKG  Hypertension P:  Hold home diltiazem, losartan   cleviprex for SBP goal of 180 for now Ongoing right femoral aline, can likely be pulled in am   CKD stage III At risk for AKI P:  Gentle hydration  Trend BMP / urinary output Replace electrolytes as indicated Avoid nephrotoxic agents, ensure adequate renal perfusion  Leukocytosis - could be reactive, also noted to have moderate leukocytes but no WBC/ bacteria P:  Send UC Monitor clinically for now  Hyperglycemia P:  Pending A1c Add SSI if glucose > 180 CBG q 4  Hypothyroidism P:  Checking TSH Hold home levothyroxine for now  GERD P:  PPI  Best practice:  Diet: NPO Pain/Anxiety/Delirium protocol (if indicated): propofol/ prn  VAP protocol (if indicated): yes DVT prophylaxis: heparin SQ/ scds GI prophylaxis: PPI Glucose control: as above Mobility: BR, PT/ OT once extubated Code Status: full  Family  Communication: son, Vevelyn Royals [phone] or [phone], updated at bedside by Dr. Cheral Marker and Dr. Gilford Raid.   Disposition: ICU  Labs   CBC: Recent Labs  Lab 03/26/19 1858 03/26/19 1911  WBC 16.2*  --   NEUTROABS 13.9*  --   HGB 12.5 12.9  HCT 37.4 38.0  MCV 93.5  --   PLT 190  --     Basic Metabolic Panel: Recent Labs  Lab 03/26/19 1858 03/26/19 1911  NA 134* 134*  K 4.5 4.5  CL 100 99  CO2 25  --   GLUCOSE 165* 158*  BUN 14 16  CREATININE 0.96 0.90  CALCIUM 9.1  --    GFR: CrCl cannot be calculated (Unknown ideal weight.). Recent Labs  Lab 03/26/19 1858  WBC 16.2*    Liver Function Tests: Recent Labs  Lab 03/26/19 1858  AST 29  ALT 14  ALKPHOS 57  BILITOT 0.9  PROT 6.1*  ALBUMIN 3.8   No results for input(s):  LIPASE, AMYLASE in the last 168 hours. No results for input(s): AMMONIA in the last 168 hours.  ABG    Component Value Date/Time   TCO2 26 03/26/2019 1911     Coagulation Profile: Recent Labs  Lab 03/26/19 1858  INR 1.0    Cardiac Enzymes: Recent Labs  Lab 03/26/19 2047  CKTOTAL 79    HbA1C: No results found for: HGBA1C  CBG: No results for input(s): GLUCAP in the last 168 hours.  Review of Systems:   Unable  Past Medical History  She,  has no past medical history on file.   Surgical History   History reviewed. No pertinent surgical history.   Social History   reports that she has never smoked. She has never used smokeless tobacco.   Family History   Her family history is not on file.   Allergies Allergies  Allergen Reactions  . Chocolate     unknown  . Gabapentin     unknown  . Penicillins     unknown  . Tetracyclines & Related     unknown     Home Medications  Prior to Admission medications   Medication Sig Start Date End Date Taking? Authorizing Provider  celecoxib (CELEBREX) 100 MG capsule Take 100 mg by mouth 2 (two) times daily.   Yes [provider]  esomeprazole (NEXIUM) 40 MG  capsule Take 40 mg by mouth daily at 12 noon.   Yes [provider]  ipratropium (ATROVENT) 0.06 % nasal spray Place 2 sprays into both nostrils 4 (four) times daily as needed for rhinitis.   Yes [provider]  meclizine (ANTIVERT) 12.5 MG tablet Take 12.5 mg by mouth 3 (three) times daily as needed for dizziness.   Yes [provider]  mometasone (NASONEX) 50 MCG/ACT nasal spray Place 2 sprays into the nose daily.   Yes [provider]  RABEprazole (ACIPHEX) 20 MG tablet Take 20 mg by mouth daily.   Yes [provider]  triamcinolone cream (KENALOG) 0.1 % Apply 1 application topically 2 (two) times daily.   Yes [provider]     I spent 45 minutes in direct patient care including reviewing data,  discussing with other providers, assessment, planning and stabilization and documentation. Time is exclusive to this patient and does not include procedures.    Kennieth Rad, MSN, AGACNP-BC [facility] Pgr: [phone] or if no answer [phone] 03/26/2019, 11:56 PM

## 2019-03-26 NOTE — Transfer of Care (Signed)
Immediate Anesthesia Transfer of Care Note  Patient: Siahna Todisco  Procedure(s) Performed: RADIOLOGY WITH ANESTHESIA (Left )  Patient Location: ICU  Anesthesia Type:General  Level of Consciousness: Patient remains intubated per anesthesia plan  Airway & Oxygen Therapy: Patient remains intubated per anesthesia plan and Patient placed on Ventilator (see vital sign flow sheet for setting)  Post-op Assessment: Report given to RN and Post -op Vital signs reviewed and stable  Post vital signs: Reviewed and stable  Last Vitals:  Vitals Value Taken Time  BP    Temp    Pulse 70 03/26/19 2255  Resp 15 03/26/19 2255  SpO2 100 % 03/26/19 2255  Vitals shown include unvalidated device data.  Last Pain:  Vitals:   03/26/19 1852  TempSrc:   PainSc: 0-No pain         Complications: No apparent anesthesia complications

## 2019-03-26 NOTE — Progress Notes (Signed)
Patient ID: Nancy Zamora, female   DOB: [birth year], 85 y.o.   MRN: [id number] INR. 41 Y RT F LSW 11. 30 am. New onset of LT sided weakness and neglect. Ct brain NO ICH ASPECTS 8 CTA ?occlusion of RT MCA sup division mid M2. CTP core of 14ml with  A penumbra of 3ml. Endovascular treatment D/W son in a 3 way call . Procedure,reasons and alternatives reviewed. Risk df ICH of 10 % ,worsening neuro function death and inability to revascularize discusssed. Son expressed understanding and provided consent to proceed. S,Kingstyn Deruiter MD

## 2019-03-26 NOTE — Anesthesia Procedure Notes (Signed)
Procedure Name: Intubation Date/Time: 03/26/2019 8:45 PM Performed by: Clovis Cao, CRNA Pre-anesthesia Checklist: Patient identified, Emergency Drugs available, Suction available, Patient being monitored and Timeout performed Patient Re-evaluated:Patient Re-evaluated prior to induction Oxygen Delivery Method: Ambu bag Preoxygenation: Pre-oxygenation with 100% oxygen Induction Type: IV induction, Rapid sequence and Cricoid Pressure applied Laryngoscope Size: Glidescope Grade View: Grade I Tube type: Oral Tube size: 8.0 mm Number of attempts: 1 Airway Equipment and Method: Stylet and Video-laryngoscopy Placement Confirmation: ETT inserted through vocal cords under direct vision,  breath sounds checked- equal and bilateral and CO2 detector Secured at: 20 cm Tube secured with: Tape Dental Injury: Teeth and Oropharynx as per pre-operative assessment

## 2019-03-26 NOTE — Anesthesia Preprocedure Evaluation (Signed)
Anesthesia Evaluation  Patient identified by MRN, date of birth, ID band Patient awake    Reviewed: Allergy & Precautions, NPO status , Patient's Chart, lab work & pertinent test results  Airway Mallampati: II  TM Distance: >3 FB     Dental  (+) Dental Advisory Given   Pulmonary neg pulmonary ROS,    breath sounds clear to auscultation       Cardiovascular negative cardio ROS   Rhythm:Regular Rate:Normal     Neuro/Psych CVA (acute stroke)    GI/Hepatic negative GI ROS, Neg liver ROS,   Endo/Other  negative endocrine ROS  Renal/GU negative Renal ROS     Musculoskeletal   Abdominal   Peds  Hematology negative hematology ROS (+)   Anesthesia Other Findings   Reproductive/Obstetrics                             Anesthesia Physical Anesthesia Plan  ASA: IV  Anesthesia Plan: General   Post-op Pain Management:    Induction: Intravenous  PONV Risk Score and Plan: 3 and Dexamethasone, Ondansetron and Treatment may vary due to age or medical condition  Airway Management Planned: Oral ETT and Video Laryngoscope Planned  Additional Equipment: Arterial line  Intra-op Plan:   Post-operative Plan: Possible Post-op intubation/ventilation  Informed Consent: I have reviewed the patients History and Physical, chart, labs and discussed the procedure including the risks, benefits and alternatives for the proposed anesthesia with the patient or authorized representative who has indicated his/her understanding and acceptance.     Dental advisory given  Plan Discussed with: CRNA  Anesthesia Plan Comments:         Anesthesia Quick Evaluation

## 2019-03-26 NOTE — Consult Note (Addendum)
INr.Post angiogram patient left intubated with  A RT groin fem arterial line.. Distal pulses all palpable  In both feet. Prior to the procedure patient experienced a run of  sustained  SVT  which was treated with IV amiodarone by anesthesia. S.Ingris Pasquarella MD

## 2019-03-26 NOTE — ED Triage Notes (Signed)
Pt BIB [facility] from home. Pt lives alone and was last seen normal at 1100 yesterday. Pt reports at 1100 today she was standing up and fell. She remembers all events. Pt presents with slurred speech, left sided facial droop, right gaze and left arm weakness.

## 2019-03-26 NOTE — Code Documentation (Signed)
Responded to Code stroke called at 1903 for slurred speech, L sided facial droop, R gaze, and L arm weakness LSN-2100(9/8). Pt in ED at time code stroke was called, CBG-158. Pt seen by RR in CT. NIH-8, CT head negative for acute changes. CTA/CTP-R distal M2 occlusion with 5cc R MCA territory infarction, predominantly in the right frontal operculum, with 22 cc area of ischemic penumbra. IR paged out at 2000. Anesthesia to bedside at 2036. Pt arrived in IR at 2055

## 2019-03-27 ENCOUNTER — Inpatient Hospital Stay (HOSPITAL_COMMUNITY): Payer: PPO

## 2019-03-27 ENCOUNTER — Encounter (HOSPITAL_COMMUNITY): Payer: Self-pay | Admitting: Interventional Radiology

## 2019-03-27 DIAGNOSIS — J9601 Acute respiratory failure with hypoxia: Secondary | ICD-10-CM

## 2019-03-27 DIAGNOSIS — I639 Cerebral infarction, unspecified: Secondary | ICD-10-CM

## 2019-03-27 DIAGNOSIS — Z789 Other specified health status: Secondary | ICD-10-CM | POA: Diagnosis not present

## 2019-03-27 DIAGNOSIS — I340 Nonrheumatic mitral (valve) insufficiency: Secondary | ICD-10-CM

## 2019-03-27 DIAGNOSIS — I361 Nonrheumatic tricuspid (valve) insufficiency: Secondary | ICD-10-CM

## 2019-03-27 LAB — CBC WITH DIFFERENTIAL/PLATELET
Abs Immature Granulocytes: 0.08 10*3/uL — ABNORMAL HIGH (ref 0.00–0.07)
Basophils Absolute: 0 10*3/uL (ref 0.0–0.1)
Basophils Relative: 0 %
Eosinophils Absolute: 0 10*3/uL (ref 0.0–0.5)
Eosinophils Relative: 0 %
HCT: 40.7 % (ref 36.0–46.0)
Hemoglobin: 13.5 g/dL (ref 12.0–15.0)
Immature Granulocytes: 1 %
Lymphocytes Relative: 14 %
Lymphs Abs: 2.3 10*3/uL (ref 0.7–4.0)
MCH: 30.5 pg (ref 26.0–34.0)
MCHC: 33.2 g/dL (ref 30.0–36.0)
MCV: 92.1 fL (ref 80.0–100.0)
Monocytes Absolute: 0.8 10*3/uL (ref 0.1–1.0)
Monocytes Relative: 5 %
Neutro Abs: 12.9 10*3/uL — ABNORMAL HIGH (ref 1.7–7.7)
Neutrophils Relative %: 80 %
Platelets: 202 10*3/uL (ref 150–400)
RBC: 4.42 MIL/uL (ref 3.87–5.11)
RDW: 13.2 % (ref 11.5–15.5)
WBC: 16.1 10*3/uL — ABNORMAL HIGH (ref 4.0–10.5)
nRBC: 0 % (ref 0.0–0.2)

## 2019-03-27 LAB — BASIC METABOLIC PANEL
Anion gap: 12 (ref 5–15)
BUN: 11 mg/dL (ref 8–23)
CO2: 22 mmol/L (ref 22–32)
Calcium: 9 mg/dL (ref 8.9–10.3)
Chloride: 99 mmol/L (ref 98–111)
Creatinine, Ser: 0.69 mg/dL (ref 0.44–1.00)
GFR calc Af Amer: >60 mL/min (ref >60)
GFR calc non Af Amer: >60 mL/min (ref >60)
Glucose, Bld: 201 mg/dL — ABNORMAL HIGH (ref 70–99)
Potassium: 3.6 mmol/L (ref 3.5–5.1)
Sodium: 133 mmol/L — ABNORMAL LOW (ref 135–145)

## 2019-03-27 LAB — ECHOCARDIOGRAM COMPLETE
Height: 62 in
Weight: 1834.23 oz

## 2019-03-27 LAB — LIPID PANEL
Cholesterol: 228 mg/dL — ABNORMAL HIGH (ref 0–200)
HDL: 133 mg/dL (ref >40)
LDL Cholesterol: 80 mg/dL (ref 0–99)
Total CHOL/HDL Ratio: 1.7 RATIO
Triglycerides: 74 mg/dL (ref <150)
VLDL: 15 mg/dL (ref 0–40)

## 2019-03-27 LAB — MRSA PCR SCREENING: MRSA by PCR: NEGATIVE

## 2019-03-27 LAB — TROPONIN I (HIGH SENSITIVITY)
Troponin I (High Sensitivity): 58 ng/L — ABNORMAL HIGH (ref <18)
Troponin I (High Sensitivity): 35 ng/L — ABNORMAL HIGH (ref <18)

## 2019-03-27 LAB — CK: Total CK: 98 U/L (ref 38–234)

## 2019-03-27 LAB — TRIGLYCERIDES: Triglycerides: 68 mg/dL (ref <150)

## 2019-03-27 LAB — TSH: TSH: 4.062 u[IU]/mL (ref 0.350–4.500)

## 2019-03-27 LAB — MAGNESIUM: Magnesium: 2 mg/dL (ref 1.7–2.4)

## 2019-03-27 MED ORDER — CLOPIDOGREL BISULFATE 75 MG PO TABS
75.0000 mg | ORAL_TABLET | Freq: Every day | ORAL | Status: DC
  Administered 2019-03-27 – 2019-03-31 (×5): 75 mg via ORAL
  Filled 2019-03-27 (×5): qty 1

## 2019-03-27 MED ORDER — HYDRALAZINE HCL 20 MG/ML IJ SOLN
2.0000 mg | INTRAMUSCULAR | Status: DC | PRN
  Administered 2019-03-27: 10 mg via INTRAVENOUS

## 2019-03-27 MED ORDER — ATORVASTATIN CALCIUM 10 MG PO TABS
20.0000 mg | ORAL_TABLET | Freq: Every day | ORAL | Status: DC
  Administered 2019-03-28 – 2019-04-01 (×5): 20 mg via ORAL
  Filled 2019-03-27 (×5): qty 2

## 2019-03-27 MED ORDER — ASPIRIN 325 MG PO TABS
325.0000 mg | ORAL_TABLET | Freq: Every day | ORAL | Status: DC
  Administered 2019-03-27 – 2019-03-31 (×5): 325 mg via ORAL
  Filled 2019-03-27 (×5): qty 1

## 2019-03-27 MED ORDER — ASPIRIN 300 MG RE SUPP
300.0000 mg | Freq: Every day | RECTAL | Status: DC
  Filled 2019-03-27: qty 1

## 2019-03-27 MED FILL — Medication: Qty: 1 | Status: AC

## 2019-03-27 NOTE — Progress Notes (Signed)
OT Cancellation Note  Patient Details Name: Nancy Zamora MRN: [id number] DOB: 1930/07/25   Cancelled Treatment:    Reason Eval/Treat Not Completed: Medical issues which prohibited therapy;Active bedrest order; remains intubated, sheath removal this AM. Will follow.   Lou Cal, OT [facility] Pager [phone] Office [place] 03/27/2019, 1:47 PM

## 2019-03-27 NOTE — Procedures (Signed)
Extubation Procedure Note  Patient Details:   Name: Nancy Zamora DOB: Jan 26, 1931 MRN: [id number]   Airway Documentation:    Vent end date: 03/27/19 Vent end time: 1434   Evaluation  O2 sats: stable throughout Complications: No apparent complications Patient did tolerate procedure well. Bilateral Breath Sounds: Clear, Diminished   Yes   Patient extubated per MD order. Patient had positive cuff leak. No stridor noted. RN at bedside. Patient was placed on 2L [place] tolerating well at this time. Vitals are stable.  Herbie [place] 03/27/2019, 2:36 PM

## 2019-03-27 NOTE — Progress Notes (Signed)
Venous lower ext test has been completed. Refer to [facility] under chart review to view preliminary results.   03/27/2019  3:44 PM Nohemy Koop, Bonnye Fava

## 2019-03-27 NOTE — Progress Notes (Addendum)
66fr sheath removed from right groin at 1020 hrs using manual pressure and quickclot hemostasis pad.  Hemostasis achieved at 1032 hrs.  Site evaluated with RN TK.  Distal pulses intact.  Sherley Leser Rt-R Debby Freiberg Rt-R

## 2019-03-27 NOTE — Progress Notes (Addendum)
STROKE TEAM PROGRESS NOTE   INTERVAL HISTORY Pt RN at bedside, still intubated. Reported apnea on SBT, will try to see if able to extubate again this afternoon. Able to follow simple commands, left hemiparesis.   Vitals:   03/27/19 0745 03/27/19 0756 03/27/19 0800 03/27/19 0815  BP: 130/63  113/67   Pulse: 74  71   Resp: 12  18   Temp:    98.3 F (36.8 C)  TempSrc:    Axillary  SpO2: 100% 100% 100%   Weight:      Height:        CBC:  Recent Labs  Lab 03/26/19 1858  03/26/19 2326 03/27/19 0235  WBC 16.2*  --   --  16.1*  NEUTROABS 13.9*  --   --  12.9*  HGB 12.5   < > 12.6 13.5  HCT 37.4   < > 37.0 40.7  MCV 93.5  --   --  92.1  PLT 190  --   --  202   < > = values in this interval not displayed.    Basic Metabolic Panel:  Recent Labs  Lab 03/26/19 1858 03/26/19 1911 03/26/19 2326 03/26/19 2327 03/27/19 0235  NA 134* 134* 134*  --  133*  K 4.5 4.5 4.0  --  3.6  CL 100 99  --   --  99  CO2 25  --   --   --  22  GLUCOSE 165* 158*  --   --  201*  BUN 14 16  --   --  11  CREATININE 0.96 0.90  --   --  0.69  CALCIUM 9.1  --   --   --  9.0  MG  --   --   --  2.0  --    Lipid Panel:     Component Value Date/Time   CHOL 228 (H) 03/27/2019 0235   TRIG 74 03/27/2019 0235   TRIG 68 03/27/2019 0235   HDL 133 03/27/2019 0235   CHOLHDL 1.7 03/27/2019 0235   VLDL 15 03/27/2019 0235   LDLCALC 80 03/27/2019 0235   HgbA1c: No results found for: HGBA1C Urine Drug Screen:     Component Value Date/Time   LABOPIA NONE DETECTED 03/26/2019 2030   COCAINSCRNUR NONE DETECTED 03/26/2019 2030   LABBENZ NONE DETECTED 03/26/2019 2030   AMPHETMU NONE DETECTED 03/26/2019 2030   THCU NONE DETECTED 03/26/2019 2030   LABBARB NONE DETECTED 03/26/2019 2030    Alcohol Level     Component Value Date/Time   ETH <10 03/26/2019 1858    IMAGING Ct Code Stroke Cta Head W/wo Contrast  Result Date: 03/26/2019 CLINICAL DATA:  Left-sided weakness EXAM: CT ANGIOGRAPHY HEAD AND NECK CT  PERFUSION BRAIN TECHNIQUE: Multidetector CT imaging of the head and neck was performed using the standard protocol during bolus administration of intravenous contrast. Multiplanar CT image reconstructions and MIPs were obtained to evaluate the vascular anatomy. Carotid stenosis measurements (when applicable) are obtained utilizing NASCET criteria, using the distal internal carotid diameter as the denominator. Multiphase CT imaging of the brain was performed following IV bolus contrast injection. Subsequent parametric perfusion maps were calculated using RAPID software. CONTRAST:  121mL OMNIPAQUE IOHEXOL 350 MG/ML SOLN COMPARISON:  Head CT 03/26/2019 FINDINGS: CTA NECK FINDINGS SKELETON: Multilevel degenerative disc disease and facet hypertrophy without bony spinal canal stenosis. OTHER NECK: Normal pharynx, larynx and major salivary glands. No cervical lymphadenopathy. Unremarkable thyroid gland. UPPER CHEST: No pneumothorax or pleural effusion. No nodules  or masses. AORTIC ARCH: There is mild calcific atherosclerosis of the aortic arch. There is no aneurysm, dissection or hemodynamically significant stenosis of the visualized ascending aorta and aortic arch. Normal variant aortic arch branching pattern with the brachiocephalic and left common carotid arteries sharing a common origin. The visualized proximal subclavian arteries are widely patent. RIGHT CAROTID SYSTEM: --Common carotid artery: Widely patent origin without common carotid artery dissection or aneurysm. --Internal carotid artery: No dissection, occlusion or aneurysm. Mild atherosclerotic calcification at the carotid bifurcation without hemodynamically significant stenosis. --External carotid artery: No acute abnormality. LEFT CAROTID SYSTEM: --Common carotid artery: Widely patent origin without common carotid artery dissection or aneurysm. --Internal carotid artery: No dissection, occlusion or aneurysm. Mild atherosclerotic calcification at the carotid  bifurcation without hemodynamically significant stenosis. --External carotid artery: No acute abnormality. VERTEBRAL ARTERIES: Left dominant configuration. Both origins are clearly patent. No dissection, occlusion or flow-limiting stenosis to the skull base (V1-V3 segments). CTA HEAD FINDINGS POSTERIOR CIRCULATION: --Vertebral arteries: The right vertebral artery terminates in PICA. Left vertebral artery V4 segment is normal. --Posterior inferior cerebellar arteries (PICA): Normal --Anterior inferior cerebellar arteries (AICA): Patent origins from the basilar artery. --Basilar artery: Normal. --Superior cerebellar arteries: Normal. --Posterior cerebral arteries: Normal. The right PCA is predominantly supplied by the posterior communicating artery. ANTERIOR CIRCULATION: --Intracranial internal carotid arteries: Normal. --Anterior cerebral arteries (ACA): Normal. Both A1 segments are present. Patent anterior communicating artery (a-comm). --Middle cerebral arteries (MCA): On the right, there is occlusion of a mid to distal right M2 branch (series 11, image 24 and series 12, image 12). VENOUS SINUSES: As permitted by contrast timing, patent. ANATOMIC VARIANTS: None Review of the MIP images confirms the above findings. CT Brain Perfusion Findings: ASPECTS: 8 CBF (<30%) Volume: 78mL Perfusion (Tmax>6.0s) volume: 38mL Mismatch Volume: 62mL Infarction Location:Right MCA territory, predominantly the right frontal operculum IMPRESSION: 1. Occlusion of a mid-distal right M2 branch, corresponding to the area of ischemia on the perfusion scan. These results were called by telephone at the time of interpretation on 03/26/2019 at 7:56 pm to Dr.ERIC [facility] , who verbally acknowledged these results. 2. 5 mL right MCA territory infarction, predominantly in the right frontal operculum, with 22 mL area of ischemic penumbra. 3. No hemodynamically significant stenosis of the carotid or vertebral arteries by the NASCET criteria. Aortic  Atherosclerosis (ICD10-I70.0). Electronically Signed   By: Ulyses Jarred M.D.   On: 03/26/2019 20:07   Ct Code Stroke Cta Neck W/wo Contrast  Result Date: 03/26/2019 CLINICAL DATA:  Left-sided weakness EXAM: CT ANGIOGRAPHY HEAD AND NECK CT PERFUSION BRAIN TECHNIQUE: Multidetector CT imaging of the head and neck was performed using the standard protocol during bolus administration of intravenous contrast. Multiplanar CT image reconstructions and MIPs were obtained to evaluate the vascular anatomy. Carotid stenosis measurements (when applicable) are obtained utilizing NASCET criteria, using the distal internal carotid diameter as the denominator. Multiphase CT imaging of the brain was performed following IV bolus contrast injection. Subsequent parametric perfusion maps were calculated using RAPID software. CONTRAST:  148mL OMNIPAQUE IOHEXOL 350 MG/ML SOLN COMPARISON:  Head CT 03/26/2019 FINDINGS: CTA NECK FINDINGS SKELETON: Multilevel degenerative disc disease and facet hypertrophy without bony spinal canal stenosis. OTHER NECK: Normal pharynx, larynx and major salivary glands. No cervical lymphadenopathy. Unremarkable thyroid gland. UPPER CHEST: No pneumothorax or pleural effusion. No nodules or masses. AORTIC ARCH: There is mild calcific atherosclerosis of the aortic arch. There is no aneurysm, dissection or hemodynamically significant stenosis of the visualized ascending aorta and aortic arch. Normal  variant aortic arch branching pattern with the brachiocephalic and left common carotid arteries sharing a common origin. The visualized proximal subclavian arteries are widely patent. RIGHT CAROTID SYSTEM: --Common carotid artery: Widely patent origin without common carotid artery dissection or aneurysm. --Internal carotid artery: No dissection, occlusion or aneurysm. Mild atherosclerotic calcification at the carotid bifurcation without hemodynamically significant stenosis. --External carotid artery: No acute  abnormality. LEFT CAROTID SYSTEM: --Common carotid artery: Widely patent origin without common carotid artery dissection or aneurysm. --Internal carotid artery: No dissection, occlusion or aneurysm. Mild atherosclerotic calcification at the carotid bifurcation without hemodynamically significant stenosis. --External carotid artery: No acute abnormality. VERTEBRAL ARTERIES: Left dominant configuration. Both origins are clearly patent. No dissection, occlusion or flow-limiting stenosis to the skull base (V1-V3 segments). CTA HEAD FINDINGS POSTERIOR CIRCULATION: --Vertebral arteries: The right vertebral artery terminates in PICA. Left vertebral artery V4 segment is normal. --Posterior inferior cerebellar arteries (PICA): Normal --Anterior inferior cerebellar arteries (AICA): Patent origins from the basilar artery. --Basilar artery: Normal. --Superior cerebellar arteries: Normal. --Posterior cerebral arteries: Normal. The right PCA is predominantly supplied by the posterior communicating artery. ANTERIOR CIRCULATION: --Intracranial internal carotid arteries: Normal. --Anterior cerebral arteries (ACA): Normal. Both A1 segments are present. Patent anterior communicating artery (a-comm). --Middle cerebral arteries (MCA): On the right, there is occlusion of a mid to distal right M2 branch (series 11, image 24 and series 12, image 12). VENOUS SINUSES: As permitted by contrast timing, patent. ANATOMIC VARIANTS: None Review of the MIP images confirms the above findings. CT Brain Perfusion Findings: ASPECTS: 8 CBF (<30%) Volume: 73mL Perfusion (Tmax>6.0s) volume: 67mL Mismatch Volume: 62mL Infarction Location:Right MCA territory, predominantly the right frontal operculum IMPRESSION: 1. Occlusion of a mid-distal right M2 branch, corresponding to the area of ischemia on the perfusion scan. These results were called by telephone at the time of interpretation on 03/26/2019 at 7:56 pm to Dr.ERIC [facility] , who verbally acknowledged  these results. 2. 5 mL right MCA territory infarction, predominantly in the right frontal operculum, with 22 mL area of ischemic penumbra. 3. No hemodynamically significant stenosis of the carotid or vertebral arteries by the NASCET criteria. Aortic Atherosclerosis (ICD10-I70.0). Electronically Signed   By: Ulyses Jarred M.D.   On: 03/26/2019 20:07   Ct Cervical Spine Wo Contrast  Result Date: 03/27/2019 CLINICAL DATA:  Fall and acute stroke EXAM: CT CERVICAL SPINE WITHOUT CONTRAST TECHNIQUE: Multidetector CT imaging of the cervical spine was performed without intravenous contrast. Multiplanar CT image reconstructions were also generated. COMPARISON:  None. FINDINGS: Alignment: No static subluxation. Facets are aligned. Occipital condyles and the lateral masses of C1 and C2 are normally approximated. Skull base and vertebrae: No acute fracture. Soft tissues and spinal canal: No prevertebral fluid or swelling. No visible canal hematoma. Disc levels: No advanced spinal canal or neural foraminal stenosis. Marked hypertrophy of the C1-2 level. Disc space narrowing is greatest at C5-6. Multilevel facet hypertrophy. Upper chest: No pneumothorax, pulmonary nodule or pleural effusion. Other: Normal visualized paraspinal cervical soft tissues. IMPRESSION: No acute fracture or static subluxation of the cervical spine. Electronically Signed   By: Ulyses Jarred M.D.   On: 03/27/2019 01:32   Mr Brain Wo Contrast  Result Date: 03/27/2019 CLINICAL DATA:  Slurred speech with left-sided facial droop EXAM: MRI HEAD WITHOUT CONTRAST TECHNIQUE: Multiplanar, multiecho pulse sequences of the brain and surrounding structures were obtained without intravenous contrast. COMPARISON:  Head CT 03/26/2019 FINDINGS: BRAIN: Multifocal acute ischemia throughout the right MCA territory, greatest along the right precentral gyrus and  within the inferior lateral right frontal lobe. Moderate ischemia the right basal ganglia. Multifocal white  matter hyperintensity, most commonly due to chronic ischemic microangiopathy. The cerebral and cerebellar volume are age-appropriate. There is no hydrocephalus. The midline structures are normal. No acute hemorrhage. VASCULAR: The major intracranial arterial and venous sinus flow voids are normal. Susceptibility-sensitive sequences show no chronic microhemorrhage or superficial siderosis. SKULL AND UPPER CERVICAL SPINE: Calvarial bone marrow signal is normal. There is no skull base mass. The visualized upper cervical spine and soft tissues are normal. SINUSES/ORBITS: There are no fluid levels or advanced mucosal thickening. The mastoid air cells and middle ear cavities are free of fluid. The orbits are normal. IMPRESSION: Multifocal ischemic infarction of the right MCA territory without acute hemorrhage or mass effect. Electronically Signed   By: Ulyses Jarred M.D.   On: 03/27/2019 02:09   Ct Code Stroke Cta Cerebral Perfusion W/wo Contrast  Result Date: 03/26/2019 CLINICAL DATA:  Left-sided weakness EXAM: CT ANGIOGRAPHY HEAD AND NECK CT PERFUSION BRAIN TECHNIQUE: Multidetector CT imaging of the head and neck was performed using the standard protocol during bolus administration of intravenous contrast. Multiplanar CT image reconstructions and MIPs were obtained to evaluate the vascular anatomy. Carotid stenosis measurements (when applicable) are obtained utilizing NASCET criteria, using the distal internal carotid diameter as the denominator. Multiphase CT imaging of the brain was performed following IV bolus contrast injection. Subsequent parametric perfusion maps were calculated using RAPID software. CONTRAST:  154mL OMNIPAQUE IOHEXOL 350 MG/ML SOLN COMPARISON:  Head CT 03/26/2019 FINDINGS: CTA NECK FINDINGS SKELETON: Multilevel degenerative disc disease and facet hypertrophy without bony spinal canal stenosis. OTHER NECK: Normal pharynx, larynx and major salivary glands. No cervical lymphadenopathy.  Unremarkable thyroid gland. UPPER CHEST: No pneumothorax or pleural effusion. No nodules or masses. AORTIC ARCH: There is mild calcific atherosclerosis of the aortic arch. There is no aneurysm, dissection or hemodynamically significant stenosis of the visualized ascending aorta and aortic arch. Normal variant aortic arch branching pattern with the brachiocephalic and left common carotid arteries sharing a common origin. The visualized proximal subclavian arteries are widely patent. RIGHT CAROTID SYSTEM: --Common carotid artery: Widely patent origin without common carotid artery dissection or aneurysm. --Internal carotid artery: No dissection, occlusion or aneurysm. Mild atherosclerotic calcification at the carotid bifurcation without hemodynamically significant stenosis. --External carotid artery: No acute abnormality. LEFT CAROTID SYSTEM: --Common carotid artery: Widely patent origin without common carotid artery dissection or aneurysm. --Internal carotid artery: No dissection, occlusion or aneurysm. Mild atherosclerotic calcification at the carotid bifurcation without hemodynamically significant stenosis. --External carotid artery: No acute abnormality. VERTEBRAL ARTERIES: Left dominant configuration. Both origins are clearly patent. No dissection, occlusion or flow-limiting stenosis to the skull base (V1-V3 segments). CTA HEAD FINDINGS POSTERIOR CIRCULATION: --Vertebral arteries: The right vertebral artery terminates in PICA. Left vertebral artery V4 segment is normal. --Posterior inferior cerebellar arteries (PICA): Normal --Anterior inferior cerebellar arteries (AICA): Patent origins from the basilar artery. --Basilar artery: Normal. --Superior cerebellar arteries: Normal. --Posterior cerebral arteries: Normal. The right PCA is predominantly supplied by the posterior communicating artery. ANTERIOR CIRCULATION: --Intracranial internal carotid arteries: Normal. --Anterior cerebral arteries (ACA): Normal. Both A1  segments are present. Patent anterior communicating artery (a-comm). --Middle cerebral arteries (MCA): On the right, there is occlusion of a mid to distal right M2 branch (series 11, image 24 and series 12, image 12). VENOUS SINUSES: As permitted by contrast timing, patent. ANATOMIC VARIANTS: None Review of the MIP images confirms the above findings. CT Brain Perfusion Findings: ASPECTS:  8 CBF (<30%) Volume: 17mL Perfusion (Tmax>6.0s) volume: 20mL Mismatch Volume: 20mL Infarction Location:Right MCA territory, predominantly the right frontal operculum IMPRESSION: 1. Occlusion of a mid-distal right M2 branch, corresponding to the area of ischemia on the perfusion scan. These results were called by telephone at the time of interpretation on 03/26/2019 at 7:56 pm to Dr.ERIC [facility] , who verbally acknowledged these results. 2. 5 mL right MCA territory infarction, predominantly in the right frontal operculum, with 22 mL area of ischemic penumbra. 3. No hemodynamically significant stenosis of the carotid or vertebral arteries by the NASCET criteria. Aortic Atherosclerosis (ICD10-I70.0). Electronically Signed   By: Ulyses Jarred M.D.   On: 03/26/2019 20:07   Dg Chest Port 1 View  Result Date: 03/26/2019 CLINICAL DATA:  Stroke EXAM: PORTABLE CHEST 1 VIEW COMPARISON:  None. FINDINGS: Endotracheal tube is 4 cm above the carina. NG tube is in the stomach. Heart is normal size. Increased markings throughout the lungs, likely chronic/scarring. No confluent opacities or effusions. IMPRESSION: Chronic changes.  No acute cardiopulmonary disease. Electronically Signed   By: Rolm Baptise M.D.   On: 03/26/2019 23:13   Ct Head Code Stroke Wo Contrast  Result Date: 03/26/2019 CLINICAL DATA:  Code stroke.  Slurred speech EXAM: CT HEAD WITHOUT CONTRAST TECHNIQUE: Contiguous axial images were obtained from the base of the skull through the vertex without intravenous contrast. COMPARISON:  None. FINDINGS: Brain: There is no mass,  hemorrhage or extra-axial collection. The size and configuration of the ventricles and extra-axial CSF spaces are normal. There is hypoattenuation of the periventricular white matter, most commonly indicating chronic ischemic microangiopathy. Vascular: No abnormal hyperdensity of the major intracranial arteries or dural venous sinuses. No intracranial atherosclerosis. Skull: The visualized skull base, calvarium and extracranial soft tissues are normal. Sinuses/Orbits: No fluid levels or advanced mucosal thickening of the visualized paranasal sinuses. No mastoid or middle ear effusion. The orbits are normal. ASPECTS [facility] Stroke Program Early CT Score) - Ganglionic level infarction (caudate, lentiform nuclei, internal capsule, insula, M1-M3 cortex): 5 - Supraganglionic infarction (M4-M6 cortex): 3 Total score (0-10 with 10 being normal): 8 IMPRESSION: 1. No intracranial hemorrhage. 2. ASPECTS is 8 These results were called by telephone at the time of interpretation on 03/26/2019 at 7:46 pm to provider Dr. Kerney Elbe, who verbally acknowledged these results. Electronically Signed   By: Ulyses Jarred M.D.   On: 03/26/2019 19:48   Cerebral Angiogram 03/27/2019 Occlusion of RT MCA superior division ant peri sylvian branch M2/M3 junction.   PHYSICAL EXAM  Temp:  [97.7 F (36.5 C)-98.3 F (36.8 C)] 98.3 F (36.8 C) (09/10 0815) Pulse Rate:  [50-93] 71 (09/10 0800) Resp:  [12-21] 18 (09/10 0800) BP: (101-186)/(56-136) 113/67 (09/10 0800) SpO2:  [97 %-100 %] 100 % (09/10 0800) Arterial Line BP: (155-205)/(61-92) 155/61 (09/10 0745) FiO2 (%):  [40 %-60 %] 40 % (09/10 0756) Weight:  [52 kg] 52 kg (09/10 0000)  General - Well nourished, well developed, intubated off sedation, no apparent distress.  Ophthalmologic - fundi not visualized due to noncooperation.  Cardiovascular - Regular rate and rhythm.  Neuro - intubated off sedation, eyes close but easily arousable, following simple commands. With eye  opening, eyes in slight right gaze position, able to blink to visual threat bilaterally, doll's eyes present, tracking both sides, PERRL. Corneal reflex present, gag and cough presnet. Breathing over the vent.  Facial symmetry not able to test due to ET tube.  Tongue protrusion not cooperative. RUE 3+/5 and RLE 3-/5. LUE  3-/5 and LLE 2/5. DTR 1+ and no babinski. Sensation, coordination not cooperative and gait not tested.  ASSESSMENT/PLAN [name] is a 85 y.o. female with history of HTN, CKD, AV stenosis, PSVT/PVCs, hypothyroidism and peripheral neuropathy presenting following a fall with slurred speech, left sided facial droop, right gaze preference and left arm weakness.   Stroke:  R MCA infarct due to right distal M2 occlusion, embolic secondary to unknown source  Code Stroke CT head No acute abnormality. ASPECTS 8    CTA head & neck occlusion mid-distal R M2 branch. No significant stenosis  CT perfusion 94mL R MCA territory infarct R frontal operculum w. 74mL penumbra  Cerebral angio occlusion R MCA M2/M3 jxn  CT CSpine no fx, subluxation  MRI  Multifocal R MCA territory infarct w/o hemorrhage or mass effect  2D Echo pending  LE venous doppler pending  Recommend loop recorder to rule out afib  LDL 80  HgbA1c pending   lovenox subq for VTE prophylaxis.   No antithrombotic prior to admission, now on ASA 325mg  and plavix 75.   Therapy recommendations:  pending   Disposition:  pending   Hx of SVT  Reported on cardizem in the past  Given current embolic stroke, concerning for occult afib  Recommend loop recorder to rule out afib on discharge.  Hypertension  Home meds:  No listed home antihypertensives  Treated w/ Cleviprex post angio  BP now Stable . Permissive hypertension (OK if < 180/105) but gradually normalize in 3-5 days . Long-term BP goal normotensive  Hyperlipidemia  Home meds:  No statin  LDL 80, goal < 70  Add lipitor 20   Continue  statin at discharge  Dysphagia . Secondary to stroke . NPO . Speech on board . On IVF   Other Stroke Risk Factors  Advanced age  Family hx stroke   Non-rheumatic AV stenosis   Other Active Problems  Fall - CK ok  UA neg, UCx pending   Trop 23  Hypothyroid, TSH ok  Osteoporosis on denosumab as OP. CSpine cleared.   Hospital day # 1  This patient is critically ill due to right MCA infarct, HTN, intubated on vent, SVT and at significant risk of neurological worsening, death form recurrent stroke, hemorrhagic conversion, heart failure, seizure, aspiration. This patient's care requires constant monitoring of vital signs, hemodynamics, respiratory and cardiac monitoring, review of multiple databases, neurological assessment, discussion with family, other specialists and medical decision making of high complexity. I spent 40 minutes of neurocritical care time in the care of this patient. I had long discussion with son over the phone, updated pt current condition, treatment plan and potential prognosis. They expressed understanding and appreciation.    Rosalin Hawking, MD PhD Stroke Neurology 03/27/2019 10:27 AM   To contact Stroke Continuity provider, please refer to [email or web address] After hours, contact General Neurology

## 2019-03-27 NOTE — Progress Notes (Signed)
[facility] Progress Note Patient Name: Nancy Zamora DOB: [birth year] MRN: [id number]   Date of Service  03/27/2019  HPI/Events of Note  CT C-spine report and images reviewed.  eICU Interventions  No evidence of C-spine fracture, okay to discontinue collar.        Kerry Kass Ladaysha Soutar 03/27/2019, 2:55 AM

## 2019-03-27 NOTE — Anesthesia Postprocedure Evaluation (Signed)
Anesthesia Post Note  Patient: Nancy Zamora  Procedure(s) Performed: RADIOLOGY WITH ANESTHESIA (Left )     Patient location during evaluation: SICU Anesthesia Type: General Level of consciousness: sedated Pain management: pain level controlled Vital Signs Assessment: post-procedure vital signs reviewed and stable Respiratory status: patient remains intubated per anesthesia plan Cardiovascular status: stable Postop Assessment: no apparent nausea or vomiting Anesthetic complications: no    Last Vitals:  Vitals:   03/27/19 0745 03/27/19 0756  BP: 130/63   Pulse: 74   Resp: 12   Temp:    SpO2: 100% 100%    Last Pain:  Vitals:   03/27/19 0400  TempSrc: Axillary  PainSc:                  Tiajuana Amass

## 2019-03-27 NOTE — Progress Notes (Signed)
  Echocardiogram 2D Echocardiogram has been performed.  Nancy Zamora 03/27/2019, 12:35 PM

## 2019-03-27 NOTE — Progress Notes (Signed)
PT Cancellation Note  Patient Details Name: Nancy Zamora MRN: [id number] DOB: Dec 06, 1930   Cancelled Treatment:    Reason Eval/Treat Not Completed: Active bedrest order (remains intubated).  Ellamae Sia, PT, DPT Acute Rehabilitation Services Pager [phone] Office [phone]    Willy Eddy 03/27/2019, 1:36 PM

## 2019-03-27 NOTE — Progress Notes (Signed)
SLP Cancellation Note  Patient Details Name: Donnabelle Ruplinger MRN: [id number] DOB: 11/15/1930   Cancelled treatment:       Reason Eval/Treat Not Completed: Medical issues which prohibited therapy(Pt is currently intubated. SLP will follow up. )  Kazuto Sevey I. Hardin Negus, [place], [place] Office number [phone] Pager [place] 03/27/2019, 7:59 AM

## 2019-03-27 NOTE — Progress Notes (Signed)
NAME:  Nancy Zamora, MRN:  [id number], DOB:  05-22-1931, LOS: 1 ADMISSION DATE:  03/26/2019, CONSULTATION DATE:  03/26/2019 REFERRING MD:  Dr. Cheral Marker, CHIEF COMPLAINT:  Stroke  Brief History   85 year old female presenting from home after being found on floor with slurred speech, left facial droop, right gaze, and left sided weakness found to have acute right M2/M3 occlusion.  Intubated for procedure.  Post intubation patient had SVT treated with amiodarone by anesthesia.  Taken for EVR however clot was too distal for safe intervention.  Patient returned to ICU intubated.    History of present illness   HPI obtained from medical chart review as patient is intubated and sedated on mechanical ventilation.   85 year old female with past medial history as below, who lives alone independently.  Family found her on the floor today with slurred speech and left sided weakness.  LKW last evening when family spoke to her by phone around 2200.  In ER, code stroke activated.  Out of window for tPA. Patient had reported falling and was alert, with slurred speech, left facial droop, right gaze, and left sided weakness with neglect.  NIH 8.  CT head negative for acute changes.  CTA head/ neck and perfusion study noted for right distal M2 occlusion.  Patient was intubated in preparation to be taken to neuro IR.  After intubation, patient had sustained SVT which was treated with amiodarone by anesthesia.  In IR, found to found to have acute occlusion of right MCA at M2/ M3 junction however intervention not able to be safely performed given how distal clot was.  Patient returned to ICU on MV, PCCM consulted for ventilator management.   Past Medical History   Nonrheumatic aortic valve stenosis HTN CKD3 Left rotator cuff tear arthropathy Right rotator cuff arthropathy s/p RSA 2014 Chronic cough Osteoarthritis Severe Osteopenia/Osteoporosis Paroxysmal SVT and PVCs Peripheral neuropathy Anemia Diverticulitis  Hypothyroidism Dry eye and blepharitis  Significant Hospital Events   9/9 Admit  Consults:  Neuro IR PCCM  Procedures:  9/9 ETT >> 9/9 R femoral aline >>  Significant Diagnostic Tests:  9/9 CTH >> 1. No intracranial hemorrhage. 2. ASPECTS is 8  9/9 CTA head/ neck /  Cerebral perfusion >> 1. Occlusion of a mid-distal right M2 branch, corresponding to the area of ischemia on the perfusion scan. These results were called by telephone at the time of interpretation on 03/26/2019 at 7:56 pm to Dr.ERIC [facility] , who verbally acknowledged these results. 2. 5 mL right MCA territory infarction, predominantly in the right frontal operculum, with 22 mL area of ischemic penumbra. 3. No hemodynamically significant stenosis of the carotid or vertebral arteries by the NASCET criteria. Aortic Atherosclerosis   9/9 cerebral angiogram  Micro Data:  9/9 SARS Cov2 >> neg 9/9 MRSA PCR >> 9/9 UC >>  Antimicrobials:   Interim history/subjective:  Status post attempted clearing right middle cerebral artery occlusion.  Remains intubated but currently tolerating pressure support. Objective   Blood pressure 113/67, pulse 71, temperature 98.3 F (36.8 C), temperature source Axillary, resp. rate 18, height 5\' 2"  (1.575 m), weight 52 kg, SpO2 100 %.    Vent Mode: PRVC FiO2 (%):  [40 %-60 %] 40 % Set Rate:  [12 bmp-15 bmp] 12 bmp Vt Set:  [400 mL] 400 mL PEEP:  [5 cmH20] 5 cmH20 Plateau Pressure:  [12 cmH20-15 cmH20] 12 cmH20   Intake/Output Summary (Last 24 hours) at 03/27/2019 0917 Last data filed at 03/27/2019 0800  Gross per 24 hour  Intake 972.42 ml  Output 1725 ml  Net -752.58 ml   Filed Weights   03/27/19 0000  Weight: 52 kg    Examination: General: Elderly female who follows commands moves all extremities HEENT: Endotracheal tube is in place no JVD lymphadenopathy is appreciated Neuro: Follows commands moves all extremities CV: Currently sinus rhythm PULM: Diminished using  the bases GI: soft, bsx4 active  Extremities: warm/dry, negative edema, right femoral arterial line in place to be removed Skin: no rashes or lesions   Resolved Hospital Problem list    Assessment & Plan:   Acute Right M2/ M3 stroke - LSW 9/8 ~2200, EVR attempted however clot too distal for safe intervention P:  Neurology primary Status post attempt of right MCA superior division occlusion   Acute respiratory insufficiency P:  Vent bundle Wean per protocol  Reported fall by patient in ER, found on floor - hx of severe osteoporosis on prolia  - CK  59 - LSW yest.  Possibly on floor since 1100 am today  P:  Cervical spine cleared by MRI Continue to monitor intensive care unit  SVT after intubation - son reports prior episode of SVT with anesthesia - s/p amiodarone by anesthesia - intinial EKG non acute, SR w/PVC P:  Currently in sinus rhythm Electrolytes checked TSH checked Continue to monitor   Hypertension P:  Hold home antihypertensives for now Cleviprex for systolic blood pressure goal less than 180 Right femoral line can be discontinued  CKD stage III At risk for AKI Lab Results  Component Value Date   CREATININE 0.69 03/27/2019   CREATININE 0.90 03/26/2019   CREATININE 0.96 03/26/2019   Recent Labs  Lab 03/26/19 1911 03/26/19 2326 03/27/19 0235  K 4.5 4.0 3.6    P:  Hydration as needed Monitor electrolytes and replete as needed Avoid nephrotoxins  Leukocytosis - could be reactive, also noted to have moderate leukocytes but no WBC/ bacteria P:  Urine culture pending Continue to monitor clinically  Hyperglycemia P:  Pending A1c Add SSI if glucose > 180 CBG q 4  Hypothyroidism P:  TSH currently 4.062 Holding home Synthroid for now  GERD P:  PPI  Best practice:  Diet: NPO Pain/Anxiety/Delirium protocol (if indicated): propofol/ prn  VAP protocol (if indicated): yes DVT prophylaxis: heparin SQ/ scds GI prophylaxis: PPI  Glucose control: as above Mobility: BR, PT/ OT once extubated Code Status: full  Family Communication: Per primary  Disposition: ICU  Labs   CBC: Recent Labs  Lab 03/26/19 1858 03/26/19 1911 03/26/19 2326 03/27/19 0235  WBC 16.2*  --   --  16.1*  NEUTROABS 13.9*  --   --  12.9*  HGB 12.5 12.9 12.6 13.5  HCT 37.4 38.0 37.0 40.7  MCV 93.5  --   --  92.1  PLT 190  --   --  [id number]    Basic Metabolic Panel: Recent Labs  Lab 03/26/19 1858 03/26/19 1911 03/26/19 2326 03/26/19 2327 03/27/19 0235  NA 134* 134* 134*  --  133*  K 4.5 4.5 4.0  --  3.6  CL 100 99  --   --  99  CO2 25  --   --   --  22  GLUCOSE 165* 158*  --   --  201*  BUN 14 16  --   --  11  CREATININE 0.96 0.90  --   --  0.69  CALCIUM 9.1  --   --   --  9.0  MG  --   --   --  2.0  --    GFR: Estimated Creatinine Clearance: 39.9 mL/min (by C-G formula based on SCr of 0.69 mg/dL). Recent Labs  Lab 03/26/19 1858 03/27/19 0235  WBC 16.2* 16.1*    Liver Function Tests: Recent Labs  Lab 03/26/19 1858  AST 29  ALT 14  ALKPHOS 57  BILITOT 0.9  PROT 6.1*  ALBUMIN 3.8   No results for input(s): LIPASE, AMYLASE in the last 168 hours. No results for input(s): AMMONIA in the last 168 hours.  ABG    Component Value Date/Time   PHART 7.505 (H) 03/26/2019 2326   PCO2ART 29.9 (L) 03/26/2019 2326   PO2ART 154.0 (H) 03/26/2019 2326   HCO3 23.6 03/26/2019 2326   TCO2 25 03/26/2019 2326   O2SAT 100.0 03/26/2019 2326     Coagulation Profile: Recent Labs  Lab 03/26/19 1858  INR 1.0    Cardiac Enzymes: Recent Labs  Lab 03/26/19 2047 03/27/19 0235  CKTOTAL 79 98    HbA1C: No results found for: HGBA1C  CBG: No results for input(s): GLUCAP in the last 168 hours.   min  Richardson Landry Minor ACNP Maryanna Shape PCCM Pager [phone] till 1 pm If no answer page [phone] 03/27/2019, 9:17 AM

## 2019-03-28 ENCOUNTER — Inpatient Hospital Stay (HOSPITAL_COMMUNITY): Payer: PPO

## 2019-03-28 DIAGNOSIS — I63411 Cerebral infarction due to embolism of right middle cerebral artery: Principal | ICD-10-CM

## 2019-03-28 LAB — URINE CULTURE: Culture: NO GROWTH

## 2019-03-28 LAB — CBC
HCT: 36.1 % (ref 36.0–46.0)
Hemoglobin: 11.8 g/dL — ABNORMAL LOW (ref 12.0–15.0)
MCH: 30.5 pg (ref 26.0–34.0)
MCHC: 32.7 g/dL (ref 30.0–36.0)
MCV: 93.3 fL (ref 80.0–100.0)
Platelets: 164 10*3/uL (ref 150–400)
RBC: 3.87 MIL/uL (ref 3.87–5.11)
RDW: 13.8 % (ref 11.5–15.5)
WBC: 15.8 10*3/uL — ABNORMAL HIGH (ref 4.0–10.5)
nRBC: 0 % (ref 0.0–0.2)

## 2019-03-28 LAB — TRIGLYCERIDES: Triglycerides: 56 mg/dL (ref <150)

## 2019-03-28 LAB — BASIC METABOLIC PANEL
Anion gap: 8 (ref 5–15)
BUN: 14 mg/dL (ref 8–23)
CO2: 23 mmol/L (ref 22–32)
Calcium: 8.2 mg/dL — ABNORMAL LOW (ref 8.9–10.3)
Chloride: 106 mmol/L (ref 98–111)
Creatinine, Ser: 0.81 mg/dL (ref 0.44–1.00)
GFR calc Af Amer: >60 mL/min (ref >60)
GFR calc non Af Amer: >60 mL/min (ref >60)
Glucose, Bld: 102 mg/dL — ABNORMAL HIGH (ref 70–99)
Potassium: 4 mmol/L (ref 3.5–5.1)
Sodium: 137 mmol/L (ref 135–145)

## 2019-03-28 LAB — HEMOGLOBIN A1C
Hgb A1c MFr Bld: 5.5 % (ref 4.8–5.6)
Mean Plasma Glucose: 111 mg/dL

## 2019-03-28 LAB — GLUCOSE, CAPILLARY
Glucose-Capillary: 78 mg/dL (ref 70–99)
Glucose-Capillary: 96 mg/dL (ref 70–99)
Glucose-Capillary: 106 mg/dL — ABNORMAL HIGH (ref 70–99)

## 2019-03-28 LAB — MAGNESIUM: Magnesium: 1.9 mg/dL (ref 1.7–2.4)

## 2019-03-28 LAB — PHOSPHORUS: Phosphorus: 3.1 mg/dL (ref 2.5–4.6)

## 2019-03-28 MED ORDER — DILTIAZEM LOAD VIA INFUSION
10.0000 mg | Freq: Once | INTRAVENOUS | Status: DC
  Filled 2019-03-28: qty 10

## 2019-03-28 MED ORDER — STARCH (THICKENING) PO POWD
ORAL | Status: DC | PRN
  Filled 2019-03-28: qty 227

## 2019-03-28 MED ORDER — ENOXAPARIN SODIUM 40 MG/0.4ML ~~LOC~~ SOLN
40.0000 mg | SUBCUTANEOUS | Status: DC
  Administered 2019-03-28 – 2019-03-31 (×4): 40 mg via SUBCUTANEOUS
  Filled 2019-03-28 (×4): qty 0.4

## 2019-03-28 MED ORDER — DILTIAZEM HCL ER COATED BEADS 180 MG PO CP24
180.0000 mg | ORAL_CAPSULE | Freq: Every day | ORAL | Status: DC
  Administered 2019-03-28 – 2019-03-31 (×4): 180 mg via ORAL
  Filled 2019-03-28 (×4): qty 1

## 2019-03-28 MED ORDER — METOPROLOL TARTRATE 5 MG/5ML IV SOLN
INTRAVENOUS | Status: AC
  Filled 2019-03-28: qty 5

## 2019-03-28 MED ORDER — INSULIN ASPART 100 UNIT/ML ~~LOC~~ SOLN
0.0000 [IU] | SUBCUTANEOUS | Status: DC
  Administered 2019-03-29 – 2019-03-30 (×2): 1 [IU] via SUBCUTANEOUS
  Administered 2019-03-31: 2 [IU] via SUBCUTANEOUS
  Administered 2019-03-31 – 2019-04-01 (×2): 1 [IU] via SUBCUTANEOUS
  Administered 2019-04-01: 2 [IU] via SUBCUTANEOUS

## 2019-03-28 MED ORDER — RESOURCE THICKENUP CLEAR PO POWD
ORAL | Status: DC | PRN
  Filled 2019-03-28: qty 125

## 2019-03-28 MED ORDER — LEVOTHYROXINE SODIUM 112 MCG PO TABS
112.0000 ug | ORAL_TABLET | Freq: Every day | ORAL | Status: DC
  Administered 2019-03-28 – 2019-04-02 (×6): 112 ug via ORAL
  Filled 2019-03-28 (×6): qty 1

## 2019-03-28 MED ORDER — DILTIAZEM HCL-DEXTROSE 100-5 MG/100ML-% IV SOLN (PREMIX)
5.0000 mg/h | INTRAVENOUS | Status: DC
  Filled 2019-03-28 (×2): qty 100

## 2019-03-28 NOTE — Evaluation (Signed)
Physical Therapy Evaluation Patient Details Name: Nancy Zamora MRN: [id number] DOB: Nov 30, 1930 Today's Date: 03/28/2019   History of Present Illness  Pt is an 85 y.o. female who presents to the ED secondary to fall from a standing position; noted to have slurred speech, left sided facial droop, right gaze preference and left arm weakness. MRI of the brain-multifocal ischemic infarction of the right MCA territory without acute hemorrhage or mass effect. PMH includes HTN, CKD, AV stenosis, PSVT/PVCs, hypothyroidism, peripheral neuropathy, and fx of LUE and LLE.  Clinical Impression  Patient presents with left sided hemiparesis, right gaze preference, left inattention, dysarthria, impaired balance and impaired mobility s/p above. Pt lives alone and very independent PTA. Has neighbors that check on her daily as well as a son. Today, pt requires Mod A for bed mobility, Min A of 2 for standing transfers and able to take a few steps to get to chair with Mod A of 2. Pt highly motivated. Son present towards end of session and discussed importance of doing functional tasks with LUE as well as working on attending to left side. Would benefit from SNF to maximize independence and mobility prior to return home. Will follow acutely.    Follow Up Recommendations SNF;Supervision for mobility/OOB    Equipment Recommendations  Other (comment)(defer)    Recommendations for Other Services       Precautions / Restrictions Precautions Precautions: Fall Restrictions Weight Bearing Restrictions: No      Mobility  Bed Mobility Overal bed mobility: Needs Assistance Bed Mobility: Rolling;Sidelying to Sit Rolling: Mod assist Sidelying to sit: Mod assist;+2 for physical assistance   Sit to supine: Mod assist;+2 for physical assistance;HOB elevated   General bed mobility comments: Manual assist to help RUE reach for rail; assist with LEs and trunk to get upright.  Transfers Overall transfer level: Needs  assistance Equipment used: 2 person hand held assist Transfers: Sit to/from [facility] Sit to Stand: Min assist;+2 physical assistance Stand pivot transfers: Mod assist;+2 physical assistance       General transfer comment: Stood from EOB x2, no knee buckling noted but requires assist for balance. Able to take a few steps to chair with Mod A of 2 for sequencing, weight shifting and support.  Ambulation/Gait                Stairs            Wheelchair Mobility    Modified Rankin (Stroke Patients Only) Modified Rankin (Stroke Patients Only) Pre-Morbid Rankin Score: Slight disability Modified Rankin: Moderately severe disability     Balance Overall balance assessment: Needs assistance Sitting-balance support: No upper extremity supported;Feet supported Sitting balance-Leahy Scale: Fair Sitting balance - Comments: Able to sit EOB with close min guard for safety   Standing balance support: During functional activity Standing balance-Leahy Scale: Poor Standing balance comment: Requires external support in standing; Min-Mod A for static standing balance for pericare                             Pertinent Vitals/Pain Pain Assessment: Faces Faces Pain Scale: Hurts a little bit Pain Location: right forearm (skin tears) Pain Descriptors / Indicators: Sore Pain Intervention(s): Repositioned;Monitored during session(redressed by wound care nurse during session)    Home Living Family/patient expects to be discharged to:: Skilled nursing facility Living Arrangements: Alone Available Help at Discharge: Family;Available PRN/intermittently Type of Home: House  Prior Function Level of Independence: Independent         Comments: Including driving a little. Walks 3 miles once per week     Hand Dominance   Dominant Hand: Right    Extremity/Trunk Assessment   Upper Extremity Assessment Upper Extremity Assessment:  Defer to OT evaluation LUE Deficits / Details: [place] 2-3 LUE Coordination: decreased fine motor;decreased gross motor    Lower Extremity Assessment Lower Extremity Assessment: LLE deficits/detail LLE Deficits / Details: Grossly ~3/5 throughout LLE Coordination: decreased fine motor;decreased gross motor    Cervical / Trunk Assessment Cervical / Trunk Assessment: Kyphotic  Communication   Communication: Expressive difficulties(dysarthria)  Cognition Arousal/Alertness: Awake/alert Behavior During Therapy: WFL for tasks assessed/performed Overall Cognitive Status: No family/caregiver present to determine baseline cognitive functioning                                 General Comments: Difficulty following/understanding some commands. Right gaze preference. Left inattention. Able to gaze midline and to the left but not sustain. Follows simple commands inconsistently with repetition.      General Comments General comments (skin integrity, edema, etc.): Skin tear on right forearm being dressed by wound care nurse during session.    Exercises     Assessment/Plan    PT Assessment Patient needs continued PT services  PT Problem List Decreased strength;Decreased mobility;Impaired tone;Decreased balance;Decreased activity tolerance;Decreased cognition;Decreased range of motion;Decreased coordination       PT Treatment Interventions Therapeutic activities;Gait training;Therapeutic exercise;Patient/family education;Balance training;Neuromuscular re-education;Functional mobility training;DME instruction;Wheelchair mobility training;Cognitive remediation    PT Goals (Current goals can be found in the Care Plan section)  Acute Rehab PT Goals Patient Stated Goal: to be able to take care of myself, I am very independent PT Goal Formulation: With patient Time For Goal Achievement: 04/11/19 Potential to Achieve Goals: Fair    Frequency Min 3X/week   Barriers to discharge  Decreased caregiver support lives alone    Co-evaluation PT/OT/SLP Co-Evaluation/Treatment: Yes Reason for Co-Treatment: To address functional/ADL transfers;For patient/therapist safety PT goals addressed during session: Mobility/safety with mobility;Balance;Strengthening/ROM OT goals addressed during session: ADL's and self-care;Strengthening/ROM       AM-PAC PT "6 Clicks" Mobility  Outcome Measure Help needed turning from your back to your side while in a flat bed without using bedrails?: A Lot Help needed moving from lying on your back to sitting on the side of a flat bed without using bedrails?: A Lot Help needed moving to and from a bed to a chair (including a wheelchair)?: A Lot Help needed standing up from a chair using your arms (e.g., wheelchair or bedside chair)?: A Lot Help needed to walk in hospital room?: A Lot Help needed climbing 3-5 steps with a railing? : Total 6 Click Score: 11    End of Session Equipment Utilized During Treatment: Gait belt Activity Tolerance: Patient tolerated treatment well Patient left: in chair;with call bell/phone within reach;with chair alarm set;with family/visitor present Nurse Communication: Mobility status;Other (comment)(purewick) PT Visit Diagnosis: Hemiplegia and hemiparesis;Difficulty in walking, not elsewhere classified (R26.2) Hemiplegia - Right/Left: Left Hemiplegia - dominant/non-dominant: Non-dominant Hemiplegia - caused by: Cerebral infarction    Time: [id number] PT Time Calculation (min) (ACUTE ONLY): 43 min   Charges:   PT Evaluation $PT Eval Moderate Complexity: 1 Mod          Wray Kearns, PT, DPT Acute Rehabilitation Services Pager [phone] Office [phone]      [email or web address]  A Darius Fillingim 03/28/2019, 1:50 PM

## 2019-03-28 NOTE — Plan of Care (Signed)
  Problem: Education: Goal: Knowledge of disease or condition will improve Outcome: Progressing   

## 2019-03-28 NOTE — Evaluation (Signed)
Speech Language Pathology Evaluation Patient Details Name: Nancy Zamora MRN: [id number] DOB: 1931/06/16 Today's Date: 03/28/2019 Time: [id number] SLP Time Calculation (min) (ACUTE ONLY): 25 min  Problem List:  Patient Active Problem List   Diagnosis Date Noted  . Airway intubation performed without difficulty   . Acute embolic stroke ([place]) [id number]  . Middle cerebral artery embolism, right 03/26/2019   Past Medical History: History reviewed. No pertinent past medical history. Past Surgical History:  Past Surgical History:  Procedure Laterality Date  . RADIOLOGY WITH ANESTHESIA Left 03/26/2019   Procedure: RADIOLOGY WITH ANESTHESIA;  Surgeon: Luanne Bras, MD;  Location: [facility];  Service: Radiology;  Laterality: Left;   HPI:  Pt is an 85 y.o. female who lives independently and presented to teh ED secondary to fall from a standing position, slurred speech, left sided facial droop, right gaze preference and left arm weakness. MRI of the brain showed multifocal ischemic infarction of the right MCA territory without acute hemorrhage or mass effect.   Assessment / Plan / Recommendation Clinical Impression  Pt reported that she was living independently prior to admission and managed her medications and finances without difficulty. She denied any baseline deficits in speech, language or cognition. Pt initially denied any acute changes in these areas but at the end of the evaluation she expressed that she is usually "pretty sharp" but believes that her processing speed is now reduced, that her speech is now slurred, and that she is having some difficulty with memory.   The [facility] Cognitive Assessment 8.1 was completed to evaluate the pt's cognitive-linguistic skills. She achieved a score of 14/30 which is below the normal limits of 26 or more out of 30 and is suggestive of a moderate impairment. She demonstrated deficits in the areas of executive function, awareness, attention, mental  manipulation, divergent naming, abstract reasoning, and delayed recall. Mild dysarthria was also demonstrated characterized by reduced articulatory precision, a hoarse vocal quality, and reduced vocal intensity which negatively impacted speech intelligibility during conversation. Skilled SLP services are clinically indicated at this time to improve dysarthria and cognitive-linguistic skills. Pt, and nursing were educated regarding results and recommendations; both parties verbalized understanding as well as agreement with plan of care.    SLP Assessment  SLP Recommendation/Assessment: Patient needs continued Speech Lanaguage Pathology Services SLP Visit Diagnosis: Dysarthria and anarthria (R47.1);Cognitive communication deficit (R41.841)    Follow Up Recommendations  24 hour supervision/assistance(Continued SLP services at level of care recommended by PT/OT)    Frequency and Duration min 2x/week  2 weeks      SLP Evaluation Cognition  Overall Cognitive Status: Impaired/Different from baseline Arousal/Alertness: Awake/alert Orientation Level: Oriented to person;Oriented to place;Oriented to situation;Disoriented to time(Oriented to month, date, year not day) Attention: Focused;Sustained Focused Attention: Impaired Focused Attention Impairment: Verbal complex(Vigilance impaired: 0/1) Sustained Attention: Impaired Sustained Attention Impairment: Verbal complex(Serial 7s: 0/3) Memory: Impaired Memory Impairment: Retrieval deficit;Decreased recall of new information;Storage deficit(Immediate: 3/5; delayed: 1/5; with cues: 3/4) Awareness: Impaired Awareness Impairment: Emergent impairment Problem Solving: Impaired Problem Solving Impairment: Verbal complex Executive Function: Reasoning;Sequencing;Organizing Reasoning: Impaired Reasoning Impairment: Verbal complex(Abstraction: 0/2) Sequencing: Impaired Sequencing Impairment: Verbal complex(Clock drawing: 1/3) Organizing: Appears  intact(Backward digit span: 1/1)       Comprehension  Auditory Comprehension Overall Auditory Comprehension: Appears within functional limits for tasks assessed Yes/No Questions: Within Functional Limits Commands: Impaired Complex Commands: (Trail completion: 0/1) Conversation: Complex Visual Recognition/Discrimination Discrimination: Within Function Limits Reading Comprehension Reading Status: Not tested    Expression Expression Primary Mode of  Expression: Verbal Verbal Expression Overall Verbal Expression: Appears within functional limits for tasks assessed Initiation: No impairment Level of Generative/Spontaneous Verbalization: Conversation Repetition: No impairment(2/2) Naming: Impairment Responsive: Not tested Confrontation: Within functional limits(3/3) Convergent: Not tested Divergent: (0/1) Written Expression Dominant Hand: Right Written Expression: (Difficulty copying cube:  0/1)   Oral / Motor  Oral Motor/Sensory Function Overall Oral Motor/Sensory Function: Mild impairment Facial ROM: Reduced left;Suspected CN VII (facial) dysfunction Facial Symmetry: Abnormal symmetry left;Suspected CN VII (facial) dysfunction Facial Strength: Reduced left;Suspected CN VII (facial) dysfunction Facial Sensation: Within Functional Limits Motor Speech Overall Motor Speech: Impaired Respiration: Within functional limits Phonation: Hoarse Resonance: Within functional limits Articulation: Impaired Level of Impairment: Sentence Intelligibility: Intelligibility reduced Word: 75-100% accurate Phrase: 75-100% accurate Sentence: 75-100% accurate Conversation: 50-74% accurate Motor Planning: Witnin functional limits Motor Speech Errors: Aware;Consistent Effective Techniques: Slow rate;Over-articulate;Increased vocal intensity   Nancy Zamora I. Hardin Negus, [place], [place] Office number [phone] Pager [place] 03/28/2019, 10:21 AM

## 2019-03-28 NOTE — Evaluation (Signed)
Occupational Therapy Evaluation Patient Details Name: Nancy Zamora MRN: [id number] DOB: 02/03/1931 Today's Date: 03/28/2019    History of Present Illness Pt is an 85 y.o. female who lives independently and presented to the ED secondary to fall from a standing position, slurred speech, left sided facial droop, right gaze preference and left arm weakness. MRI of the brain showed multifocal ischemic infarction of the right MCA territory without acute hemorrhage or mass effect. PHMx:history of HTN, CKD, AV stenosis, PSVT/PVCs, hypothyroidism, peripheral neuropathy, and fx of LUE and LLE   Clinical Impression   This 85 yo female admitted with above presents to acute OT with inattention to left side, decreased AROM LUE/LLE, decreased balance, decreased insight into deficits, decrease mobility all affecting her safety and independence with basic ADLs and IADLs (pt was Independent pta). She will benefit from acute OT with follow up at SNF to work back towards PLOF.    Follow Up Recommendations  SNF;Supervision/Assistance - 24 hour    Equipment Recommendations  Other (comment)(TBD next venue)       Precautions / Restrictions Precautions Precautions: Fall      Mobility Bed Mobility Overal bed mobility: Needs Assistance Bed Mobility: Sit to Supine       Sit to supine: Mod assist;+2 for physical assistance;HOB elevated   General bed mobility comments: A for legs and trunk  Transfers Overall transfer level: Needs assistance Equipment used: 2 person hand held assist Transfers: Sit to/from [facility] Sit to Stand: Min assist;+2 physical assistance Stand pivot transfers: Mod assist;+2 physical assistance            Balance Overall balance assessment: Needs assistance Sitting-balance support: No upper extremity supported;Feet supported Sitting balance-Leahy Scale: Fair     Standing balance support: Bilateral upper extremity supported Standing balance-Leahy Scale:  Poor                             ADL either performed or assessed with clinical judgement   ADL Overall ADL's : Needs assistance/impaired Eating/Feeding: Supervision/ safety;Set up;Sitting   Grooming: Moderate assistance;Sitting   Upper Body Bathing: Moderate assistance;Sitting   Lower Body Bathing: Maximal assistance Lower Body Bathing Details (indicate cue type and reason): Min A +2 sit<>stand Upper Body Dressing : Maximal assistance;Sitting   Lower Body Dressing: Total assistance Lower Body Dressing Details (indicate cue type and reason): Min A +2 sit<>stand Toilet Transfer: Moderate assistance;+2 for physical assistance;Stand-pivot   Toileting- Clothing Manipulation and Hygiene: Total assistance Toileting - Clothing Manipulation Details (indicate cue type and reason): Min A +2 sit<>stand             Vision Patient Visual Report: No change from baseline Vision Assessment?: Yes Alignment/Gaze Preference: Gaze right;Head turned Additional Comments: Pt with difficult time and increaed cues to look and find objects on her left            Pertinent Vitals/Pain Pain Assessment: Faces Faces Pain Scale: Hurts a little bit Pain Location: right forearm (skin tears) Pain Descriptors / Indicators: Sore Pain Intervention(s): Limited activity within patient's tolerance;Monitored during session(redressed by wound care RN)     Hand Dominance Right   Extremity/Trunk Assessment Upper Extremity Assessment Upper Extremity Assessment: LUE deficits/detail LUE Deficits / Details: [place] 2-3 LUE Coordination: decreased fine motor;decreased gross motor           Communication Communication Communication: No difficulties   Cognition Arousal/Alertness: Awake/alert Behavior During Therapy: WFL for tasks assessed/performed Overall Cognitive Status:  Within Functional Limits for tasks assessed                                                Home  Living Family/patient expects to be discharged to:: Skilled nursing facility Living Arrangements: Alone Available Help at Discharge: Family;Available PRN/intermittently Type of Home: House                              Lives With: Alone    Prior Functioning/Environment Level of Independence: Independent        Comments: Including driving a little. Walks 3 miles a day        OT Problem List: Decreased strength;Decreased range of motion;Decreased activity tolerance;Impaired balance (sitting and/or standing);Impaired vision/perception;Impaired UE functional use;Decreased knowledge of use of DME or AE;Decreased safety awareness;Impaired tone;Decreased cognition;Pain      OT Treatment/Interventions: Self-care/ADL training;DME and/or AE instruction;Patient/family education;Balance training;Neuromuscular education;Therapeutic activities;Visual/perceptual remediation/compensation    OT Goals(Current goals can be found in the care plan section) Acute Rehab OT Goals Patient Stated Goal: to be able to take care of myself, I am very independent OT Goal Formulation: With patient/family Time For Goal Achievement: 04/11/19 Potential to Achieve Goals: Good  OT Frequency: Min 2X/week   Barriers to D/C: Decreased caregiver support          Co-evaluation PT/OT/SLP Co-Evaluation/Treatment: Yes Reason for Co-Treatment: For patient/therapist safety;To address functional/ADL transfers PT goals addressed during session: Mobility/safety with mobility;Balance;Strengthening/ROM OT goals addressed during session: ADL's and self-care;Strengthening/ROM      AM-PAC OT "6 Clicks" Daily Activity     Outcome Measure Help from another person eating meals?: A Little Help from another person taking care of personal grooming?: A Lot Help from another person toileting, which includes using toliet, bedpan, or urinal?: A Lot Help from another person bathing (including washing, rinsing, drying)?: A  Lot Help from another person to put on and taking off regular upper body clothing?: A Lot Help from another person to put on and taking off regular lower body clothing?: Total 6 Click Score: 12   End of Session Equipment Utilized During Treatment: Gait belt Nurse Communication: Mobility status(needed purwick put back in)  Activity Tolerance: Patient tolerated treatment well Patient left: in chair;with call bell/phone within reach;with chair alarm set  OT Visit Diagnosis: Unsteadiness on feet (R26.81);Other abnormalities of gait and mobility (R26.89);Muscle weakness (generalized) (M62.81);Low vision, both eyes (H54.2);Other symptoms and signs involving cognitive function;Hemiplegia and hemiparesis;Pain Hemiplegia - Right/Left: Left Hemiplegia - dominant/non-dominant: Non-Dominant Hemiplegia - caused by: Cerebral infarction Pain - Right/Left: Left Pain - part of body: Arm(forearm)                Time: [id number] OT Time Calculation (min): 43 min Charges:  OT General Charges $OT Visit: 1 Visit OT Evaluation $OT Eval Moderate Complexity: 1 Mod OT Treatments $Self Care/Home Management : 8-22 mins Golden Circle, OTR/L Acute Rehab Services Pager [phone] Office [phone]     Almon Register 03/28/2019, 1:38 PM

## 2019-03-28 NOTE — Progress Notes (Signed)
NAME:  Nancy Zamora, MRN:  [id number], DOB:  23-Mar-1931, LOS: 2 ADMISSION DATE:  03/26/2019, CONSULTATION DATE:  03/26/2019 REFERRING MD:  Dr. Cheral Marker, CHIEF COMPLAINT:  Stroke  Brief History   85 year old female presenting from home after being found on floor with slurred speech, left facial droop, right gaze, and left sided weakness found to have acute right M2/M3 occlusion.  Intubated for procedure.  Post intubation patient had SVT treated with amiodarone by anesthesia.  Taken for EVR however clot was too distal for safe intervention.  Patient returned to ICU intubated.    History of present illness   HPI obtained from medical chart review as patient is intubated and sedated on mechanical ventilation.   85 year old female with past medial history as below, who lives alone independently.  Family found her on the floor today with slurred speech and left sided weakness.  LKW last evening when family spoke to her by phone around 2200.  In ER, code stroke activated.  Out of window for tPA. Patient had reported falling and was alert, with slurred speech, left facial droop, right gaze, and left sided weakness with neglect.  NIH 8.  CT head negative for acute changes.  CTA head/ neck and perfusion study noted for right distal M2 occlusion.  Patient was intubated in preparation to be taken to neuro IR.  After intubation, patient had sustained SVT which was treated with amiodarone by anesthesia.  In IR, found to found to have acute occlusion of right MCA at M2/ M3 junction however intervention not able to be safely performed given how distal clot was.  Patient returned to ICU on MV, PCCM consulted for ventilator management.   Past Medical History   Nonrheumatic aortic valve stenosis HTN CKD3 Left rotator cuff tear arthropathy Right rotator cuff arthropathy s/p RSA 2014 Chronic cough Osteoarthritis Severe Osteopenia/Osteoporosis Paroxysmal SVT and PVCs Peripheral neuropathy Anemia Diverticulitis  Hypothyroidism Dry eye and blepharitis  Significant Hospital Events   9/9 Admit  Consults:  Neuro IR PCCM  Procedures:  9/9 ETT >>9/10 9/9 R femoral aline >>9/10  Significant Diagnostic Tests:  9/9 CTH >> 1. No intracranial hemorrhage. 2. ASPECTS is 8  9/9 CTA head/ neck /  Cerebral perfusion >> 1. Occlusion of a mid-distal right M2 branch, corresponding to the area of ischemia on the perfusion scan. These results were called by telephone at the time of interpretation on 03/26/2019 at 7:56 pm to Dr.ERIC [facility] , who verbally acknowledged these results. 2. 5 mL right MCA territory infarction, predominantly in the right frontal operculum, with 22 mL area of ischemic penumbra. 3. No hemodynamically significant stenosis of the carotid or vertebral arteries by the NASCET criteria. Aortic Atherosclerosis   9/9 cerebral angiogram  Micro Data:  9/9 SARS Cov2 >> neg 9/9 MRSA PCR >> negative 9/9 UC >> not done  Antimicrobials:   Interim history/subjective:  Status post attempted clearing right middle cerebral artery occlusion.  Remains intubated but currently tolerating pressure support. Objective   Blood pressure 138/66, pulse 73, temperature 98.3 F (36.8 C), temperature source Axillary, resp. rate 20, height 5\' 2"  (1.575 m), weight 52 kg, SpO2 100 %.    Vent Mode: PSV;CPAP FiO2 (%):  [40 %] 40 % PEEP:  [5 cmH20] 5 cmH20 Pressure Support:  [8 cmH20] 8 cmH20   Intake/Output Summary (Last 24 hours) at 03/28/2019 0906 Last data filed at 03/28/2019 0700 Gross per 24 hour  Intake 885.64 ml  Output 150 ml  Net  735.64 ml   Filed Weights   03/27/19 0000  Weight: 52 kg    Examination: General: Frail elderly female who is awake alert follows command HEENT: No JVD lymphadenopathy is appreciated Neuro: Alert and orientated moves all extremities CV: Heart sounds are irregular with intermittent.  She has presented with rapid ventricular response PULM: Decreased breath  sounds in the base GI: soft, bsx4 active  Extremities: warm/dry, negative edema  Skin: no rashes or lesions    Resolved Hospital Problem list    Assessment & Plan:   Acute Right M2/ M3 stroke - LSW 9/8 ~2200, EVR attempted however clot too distal for safe intervention P:  Neurology is primary    Acute respiratory insufficiency P:  Vent bundle Wean per protocol  Reported fall by patient in ER, found on floor - hx of severe osteoporosis on prolia  - CK  79  P:  Cervical spine is been cleared When atrial fibrillation controlled she can transition to stepdown unit  SVT after intubation - son reports prior episode of SVT with anesthesia - s/p amiodarone by anesthesia - intinial EKG non acute, SR w/PVC -Intermittent atrial fibrillation with rapid ventricular response P:  TSH is been checked and is normal Cardizem is been ordered for heart rate greater than 130 Continue cardiac monitor Check twelve-lead    Hypertension P:  PRN Apresoline Currently off Cleviprex   CKD stage III At risk for AKI Lab Results  Component Value Date   CREATININE 0.81 03/28/2019   CREATININE 0.69 03/27/2019   CREATININE 0.90 03/26/2019   Recent Labs  Lab 03/26/19 2326 03/27/19 0235 03/28/19 0154  K 4.0 3.6 4.0    P:  Hydration as needed Monitor electrolytes  Leukocytosis - could be reactive, also noted to have moderate leukocytes but no WBC/ bacteria P:  Urine culture remains pending on 03/28/2019 Continue to monitor  Hyperglycemia CBG (last 3)  No results for input(s): GLUCAP in the last 72 hours.   P:  Glucose 102 Continue to monitor  Hypothyroidism P:  Synthroid on hold  GERD P:  Continue PPI  Best practice:  Diet: NPO Pain/Anxiety/Delirium protocol (if indicated): propofol/ prn  VAP protocol (if indicated): yes DVT prophylaxis: heparin SQ/ scds GI prophylaxis: PPI Glucose control: as above Mobility: Out of bed PT and OT Code Status: full   Family Communication: Per primary  Disposition: May be ready for transition to stepdown unit near future  Labs   CBC: Recent Labs  Lab 03/26/19 1858 03/26/19 1911 03/26/19 2326 03/27/19 0235 03/28/19 0154  WBC 16.2*  --   --  16.1* 15.8*  NEUTROABS 13.9*  --   --  12.9*  --   HGB 12.5 12.9 12.6 13.5 11.8*  HCT 37.4 38.0 37.0 40.7 36.1  MCV 93.5  --   --  92.1 93.3  PLT 190  --   --  [id number]    Basic Metabolic Panel: Recent Labs  Lab 03/26/19 1858 03/26/19 1911 03/26/19 2326 03/26/19 2327 03/27/19 0235 03/28/19 0154  NA 134* 134* 134*  --  133* 137  K 4.5 4.5 4.0  --  3.6 4.0  CL 100 99  --   --  99 106  CO2 25  --   --   --  22 23  GLUCOSE 165* 158*  --   --  201* 102*  BUN 14 16  --   --  11 14  CREATININE 0.96 0.90  --   --  0.69 0.81  CALCIUM 9.1  --   --   --  9.0 8.2*  MG  --   --   --  2.0  --  1.9  PHOS  --   --   --   --   --  3.1   GFR: Estimated Creatinine Clearance: 39.4 mL/min (by C-G formula based on SCr of 0.81 mg/dL). Recent Labs  Lab 03/26/19 1858 03/27/19 0235 03/28/19 0154  WBC 16.2* 16.1* 15.8*    Liver Function Tests: Recent Labs  Lab 03/26/19 1858  AST 29  ALT 14  ALKPHOS 57  BILITOT 0.9  PROT 6.1*  ALBUMIN 3.8   No results for input(s): LIPASE, AMYLASE in the last 168 hours. No results for input(s): AMMONIA in the last 168 hours.  ABG    Component Value Date/Time   PHART 7.505 (H) 03/26/2019 2326   PCO2ART 29.9 (L) 03/26/2019 2326   PO2ART 154.0 (H) 03/26/2019 2326   HCO3 23.6 03/26/2019 2326   TCO2 25 03/26/2019 2326   O2SAT 100.0 03/26/2019 2326     Coagulation Profile: Recent Labs  Lab 03/26/19 1858  INR 1.0    Cardiac Enzymes: Recent Labs  Lab 03/26/19 2047 03/27/19 0235  CKTOTAL 79 98    HbA1C: Hgb A1c MFr Bld  Date/Time Value Ref Range Status  03/27/2019 02:35 AM 5.5 4.8 - 5.6 % Final    Comment:    (NOTE)         Prediabetes: 5.7 - 6.4         Diabetes: >6.4         Glycemic control for  adults with diabetes: <7.0     CBG: No results for input(s): GLUCAP in the last 168 hours.    Richardson Landry Benard Minturn ACNP Maryanna Shape PCCM Pager [phone] till 1 pm If no answer page [phone] 03/28/2019, 9:06 AM

## 2019-03-28 NOTE — Progress Notes (Signed)
Initial Nutrition Assessment  DOCUMENTATION CODES:   Non-severe (moderate) malnutrition in context of chronic illness  INTERVENTION:   - Magic cup TID with meals, each supplement provides 290 kcal and 9 grams of protein  - RD will monitor for pt's ability to meet nutritional needs via PO intake alone given restrictive nature of current diet order  NUTRITION DIAGNOSIS:   Moderate Malnutrition related to chronic illness (CKD stage III) as evidenced by mild fat depletion, mild muscle depletion, moderate muscle depletion.  GOAL:   Patient will meet greater than or equal to 90% of their needs  MONITOR:   PO intake, Weight trends, Supplement acceptance, Diet advancement, Labs, Skin  REASON FOR ASSESSMENT:   Low Braden    ASSESSMENT:   85 year old female who presented to the ED on 9/09 with acute stroke symptoms. PMH of HTN, CKD stage III, anemia. Pt found to have acute right MCA stroke. Pt required intubation.   9/09 - VIR team unable to retrieve clot 9/10 - extubated 9/11 - diet advanced to Dysphagia 1 with honey-thick liquids  Spoke with pt at bedside. Pt requesting washcloth for her mouth which RD provided.  Pt reports that she is feeling better today. Pt states that she typically eats well and has multiple meals daily. Pt reports that PTA, she had corn and broccoli.  Pt denies any recent weight changes. Pt reports that she believes her weight has been stable but is unable to identify UBW.  RD will order Magic Cup to aid pt in meeting kcal and protein needs given restrictive diet of Dysphagia 1 with honey-thick liquids.  Medications reviewed and include: SSI IVF: NS @ 40 ml/hr  Labs reviewed: cholesterol 228 CBG's: 78, 96  NUTRITION - FOCUSED PHYSICAL EXAM:    Most Recent Value  Orbital Region  Mild depletion  Upper Arm Region  Mild depletion  Thoracic and Lumbar Region  Mild depletion  Buccal Region  No depletion  Temple Region  Mild depletion  Clavicle Bone  Region  Moderate depletion  Clavicle and Acromion Bone Region  Moderate depletion  Scapular Bone Region  Moderate depletion  Dorsal Hand  Mild depletion  Patellar Region  Mild depletion  Anterior Thigh Region  Mild depletion  Posterior Calf Region  Mild depletion  Edema (RD Assessment)  None  Hair  Reviewed  Eyes  Reviewed  Mouth  Reviewed  Skin  Reviewed  Nails  Reviewed       Diet Order:   Diet Order            DIET - DYS 1 Room service appropriate? Yes; Fluid consistency: Honey Thick  Diet effective now              EDUCATION NEEDS:   No education needs have been identified at this time  Skin:  Skin Assessment: Skin Integrity Issues: Skin Integrity Issues: Incisions: closed incision to left groin  Last BM:  no documented BM  Height:   Ht Readings from Last 1 Encounters:  03/26/19 5\' 2"  (1.575 m)    Weight:   Wt Readings from Last 1 Encounters:  03/27/19 52 kg    Ideal Body Weight:  50 kg  BMI:  Body mass index is 20.97 kg/m.  Estimated Nutritional Needs:   Kcal:  1200-1400  Protein:  60-75 grams  Fluid:  1.2-1.4 L    Gaynell Face, MS, RD, LDN Inpatient Clinical Dietitian Pager: [phone] Weekend/After Hours: [phone]

## 2019-03-28 NOTE — Progress Notes (Signed)
STROKE TEAM PROGRESS NOTE   INTERVAL HISTORY Pt sitting in bed, speech therapist at bedside. As per RN, pt had intermittent afib RVR overnight and HR goes to 160s. Now in NSR. Pt extubated yesterday, fully awak alert and orientated, mild weakness left UE. Pending speech.   Vitals:   03/28/19 0600 03/28/19 0700 03/28/19 0800 03/28/19 0900  BP: (!) 147/81 138/66 131/72 129/68  Pulse: 83 73 (!) 43 65  Resp: (!) 23 20 (!) 21 20  Temp:      TempSrc:      SpO2: 99% 100% 100% 100%  Weight:      Height:        CBC:  Recent Labs  Lab 03/26/19 1858  03/27/19 0235 03/28/19 0154  WBC 16.2*  --  16.1* 15.8*  NEUTROABS 13.9*  --  12.9*  --   HGB 12.5   < > 13.5 11.8*  HCT 37.4   < > 40.7 36.1  MCV 93.5  --  92.1 93.3  PLT 190  --  202 164   < > = values in this interval not displayed.    Basic Metabolic Panel:  Recent Labs  Lab 03/26/19 2327 03/27/19 0235 03/28/19 0154  NA  --  133* 137  K  --  3.6 4.0  CL  --  99 106  CO2  --  22 23  GLUCOSE  --  201* 102*  BUN  --  11 14  CREATININE  --  0.69 0.81  CALCIUM  --  9.0 8.2*  MG 2.0  --  1.9  PHOS  --   --  3.1   Lipid Panel:     Component Value Date/Time   CHOL 228 (H) 03/27/2019 0235   TRIG 56 03/28/2019 0154   HDL 133 03/27/2019 0235   CHOLHDL 1.7 03/27/2019 0235   VLDL 15 03/27/2019 0235   LDLCALC 80 03/27/2019 0235   HgbA1c:  Lab Results  Component Value Date   HGBA1C 5.5 03/27/2019   Urine Drug Screen:     Component Value Date/Time   LABOPIA NONE DETECTED 03/26/2019 2030   COCAINSCRNUR NONE DETECTED 03/26/2019 2030   LABBENZ NONE DETECTED 03/26/2019 2030   AMPHETMU NONE DETECTED 03/26/2019 2030   THCU NONE DETECTED 03/26/2019 2030   LABBARB NONE DETECTED 03/26/2019 2030    Alcohol Level     Component Value Date/Time   ETH <10 03/26/2019 1858    IMAGING Ct Code Stroke Cta Head W/wo Contrast  Result Date: 03/26/2019 CLINICAL DATA:  Left-sided weakness EXAM: CT ANGIOGRAPHY HEAD AND NECK CT PERFUSION  BRAIN TECHNIQUE: Multidetector CT imaging of the head and neck was performed using the standard protocol during bolus administration of intravenous contrast. Multiplanar CT image reconstructions and MIPs were obtained to evaluate the vascular anatomy. Carotid stenosis measurements (when applicable) are obtained utilizing NASCET criteria, using the distal internal carotid diameter as the denominator. Multiphase CT imaging of the brain was performed following IV bolus contrast injection. Subsequent parametric perfusion maps were calculated using RAPID software. CONTRAST:  151mL OMNIPAQUE IOHEXOL 350 MG/ML SOLN COMPARISON:  Head CT 03/26/2019 FINDINGS: CTA NECK FINDINGS SKELETON: Multilevel degenerative disc disease and facet hypertrophy without bony spinal canal stenosis. OTHER NECK: Normal pharynx, larynx and major salivary glands. No cervical lymphadenopathy. Unremarkable thyroid gland. UPPER CHEST: No pneumothorax or pleural effusion. No nodules or masses. AORTIC ARCH: There is mild calcific atherosclerosis of the aortic arch. There is no aneurysm, dissection or hemodynamically significant stenosis of the visualized ascending  aorta and aortic arch. Normal variant aortic arch branching pattern with the brachiocephalic and left common carotid arteries sharing a common origin. The visualized proximal subclavian arteries are widely patent. RIGHT CAROTID SYSTEM: --Common carotid artery: Widely patent origin without common carotid artery dissection or aneurysm. --Internal carotid artery: No dissection, occlusion or aneurysm. Mild atherosclerotic calcification at the carotid bifurcation without hemodynamically significant stenosis. --External carotid artery: No acute abnormality. LEFT CAROTID SYSTEM: --Common carotid artery: Widely patent origin without common carotid artery dissection or aneurysm. --Internal carotid artery: No dissection, occlusion or aneurysm. Mild atherosclerotic calcification at the carotid  bifurcation without hemodynamically significant stenosis. --External carotid artery: No acute abnormality. VERTEBRAL ARTERIES: Left dominant configuration. Both origins are clearly patent. No dissection, occlusion or flow-limiting stenosis to the skull base (V1-V3 segments). CTA HEAD FINDINGS POSTERIOR CIRCULATION: --Vertebral arteries: The right vertebral artery terminates in PICA. Left vertebral artery V4 segment is normal. --Posterior inferior cerebellar arteries (PICA): Normal --Anterior inferior cerebellar arteries (AICA): Patent origins from the basilar artery. --Basilar artery: Normal. --Superior cerebellar arteries: Normal. --Posterior cerebral arteries: Normal. The right PCA is predominantly supplied by the posterior communicating artery. ANTERIOR CIRCULATION: --Intracranial internal carotid arteries: Normal. --Anterior cerebral arteries (ACA): Normal. Both A1 segments are present. Patent anterior communicating artery (a-comm). --Middle cerebral arteries (MCA): On the right, there is occlusion of a mid to distal right M2 branch (series 11, image 24 and series 12, image 12). VENOUS SINUSES: As permitted by contrast timing, patent. ANATOMIC VARIANTS: None Review of the MIP images confirms the above findings. CT Brain Perfusion Findings: ASPECTS: 8 CBF (<30%) Volume: 39mL Perfusion (Tmax>6.0s) volume: 73mL Mismatch Volume: 44mL Infarction Location:Right MCA territory, predominantly the right frontal operculum IMPRESSION: 1. Occlusion of a mid-distal right M2 branch, corresponding to the area of ischemia on the perfusion scan. These results were called by telephone at the time of interpretation on 03/26/2019 at 7:56 pm to Dr.ERIC [facility] , who verbally acknowledged these results. 2. 5 mL right MCA territory infarction, predominantly in the right frontal operculum, with 22 mL area of ischemic penumbra. 3. No hemodynamically significant stenosis of the carotid or vertebral arteries by the NASCET criteria. Aortic  Atherosclerosis (ICD10-I70.0). Electronically Signed   By: Ulyses Jarred M.D.   On: 03/26/2019 20:07   Ct Code Stroke Cta Neck W/wo Contrast  Result Date: 03/26/2019 CLINICAL DATA:  Left-sided weakness EXAM: CT ANGIOGRAPHY HEAD AND NECK CT PERFUSION BRAIN TECHNIQUE: Multidetector CT imaging of the head and neck was performed using the standard protocol during bolus administration of intravenous contrast. Multiplanar CT image reconstructions and MIPs were obtained to evaluate the vascular anatomy. Carotid stenosis measurements (when applicable) are obtained utilizing NASCET criteria, using the distal internal carotid diameter as the denominator. Multiphase CT imaging of the brain was performed following IV bolus contrast injection. Subsequent parametric perfusion maps were calculated using RAPID software. CONTRAST:  165mL OMNIPAQUE IOHEXOL 350 MG/ML SOLN COMPARISON:  Head CT 03/26/2019 FINDINGS: CTA NECK FINDINGS SKELETON: Multilevel degenerative disc disease and facet hypertrophy without bony spinal canal stenosis. OTHER NECK: Normal pharynx, larynx and major salivary glands. No cervical lymphadenopathy. Unremarkable thyroid gland. UPPER CHEST: No pneumothorax or pleural effusion. No nodules or masses. AORTIC ARCH: There is mild calcific atherosclerosis of the aortic arch. There is no aneurysm, dissection or hemodynamically significant stenosis of the visualized ascending aorta and aortic arch. Normal variant aortic arch branching pattern with the brachiocephalic and left common carotid arteries sharing a common origin. The visualized proximal subclavian arteries are widely patent. RIGHT  CAROTID SYSTEM: --Common carotid artery: Widely patent origin without common carotid artery dissection or aneurysm. --Internal carotid artery: No dissection, occlusion or aneurysm. Mild atherosclerotic calcification at the carotid bifurcation without hemodynamically significant stenosis. --External carotid artery: No acute  abnormality. LEFT CAROTID SYSTEM: --Common carotid artery: Widely patent origin without common carotid artery dissection or aneurysm. --Internal carotid artery: No dissection, occlusion or aneurysm. Mild atherosclerotic calcification at the carotid bifurcation without hemodynamically significant stenosis. --External carotid artery: No acute abnormality. VERTEBRAL ARTERIES: Left dominant configuration. Both origins are clearly patent. No dissection, occlusion or flow-limiting stenosis to the skull base (V1-V3 segments). CTA HEAD FINDINGS POSTERIOR CIRCULATION: --Vertebral arteries: The right vertebral artery terminates in PICA. Left vertebral artery V4 segment is normal. --Posterior inferior cerebellar arteries (PICA): Normal --Anterior inferior cerebellar arteries (AICA): Patent origins from the basilar artery. --Basilar artery: Normal. --Superior cerebellar arteries: Normal. --Posterior cerebral arteries: Normal. The right PCA is predominantly supplied by the posterior communicating artery. ANTERIOR CIRCULATION: --Intracranial internal carotid arteries: Normal. --Anterior cerebral arteries (ACA): Normal. Both A1 segments are present. Patent anterior communicating artery (a-comm). --Middle cerebral arteries (MCA): On the right, there is occlusion of a mid to distal right M2 branch (series 11, image 24 and series 12, image 12). VENOUS SINUSES: As permitted by contrast timing, patent. ANATOMIC VARIANTS: None Review of the MIP images confirms the above findings. CT Brain Perfusion Findings: ASPECTS: 8 CBF (<30%) Volume: 46mL Perfusion (Tmax>6.0s) volume: 35mL Mismatch Volume: 3mL Infarction Location:Right MCA territory, predominantly the right frontal operculum IMPRESSION: 1. Occlusion of a mid-distal right M2 branch, corresponding to the area of ischemia on the perfusion scan. These results were called by telephone at the time of interpretation on 03/26/2019 at 7:56 pm to Dr.ERIC [facility] , who verbally acknowledged  these results. 2. 5 mL right MCA territory infarction, predominantly in the right frontal operculum, with 22 mL area of ischemic penumbra. 3. No hemodynamically significant stenosis of the carotid or vertebral arteries by the NASCET criteria. Aortic Atherosclerosis (ICD10-I70.0). Electronically Signed   By: Ulyses Jarred M.D.   On: 03/26/2019 20:07   Ct Cervical Spine Wo Contrast  Result Date: 03/27/2019 CLINICAL DATA:  Fall and acute stroke EXAM: CT CERVICAL SPINE WITHOUT CONTRAST TECHNIQUE: Multidetector CT imaging of the cervical spine was performed without intravenous contrast. Multiplanar CT image reconstructions were also generated. COMPARISON:  None. FINDINGS: Alignment: No static subluxation. Facets are aligned. Occipital condyles and the lateral masses of C1 and C2 are normally approximated. Skull base and vertebrae: No acute fracture. Soft tissues and spinal canal: No prevertebral fluid or swelling. No visible canal hematoma. Disc levels: No advanced spinal canal or neural foraminal stenosis. Marked hypertrophy of the C1-2 level. Disc space narrowing is greatest at C5-6. Multilevel facet hypertrophy. Upper chest: No pneumothorax, pulmonary nodule or pleural effusion. Other: Normal visualized paraspinal cervical soft tissues. IMPRESSION: No acute fracture or static subluxation of the cervical spine. Electronically Signed   By: Ulyses Jarred M.D.   On: 03/27/2019 01:32   Mr Brain Wo Contrast  Result Date: 03/27/2019 CLINICAL DATA:  Slurred speech with left-sided facial droop EXAM: MRI HEAD WITHOUT CONTRAST TECHNIQUE: Multiplanar, multiecho pulse sequences of the brain and surrounding structures were obtained without intravenous contrast. COMPARISON:  Head CT 03/26/2019 FINDINGS: BRAIN: Multifocal acute ischemia throughout the right MCA territory, greatest along the right precentral gyrus and within the inferior lateral right frontal lobe. Moderate ischemia the right basal ganglia. Multifocal white  matter hyperintensity, most commonly due to chronic ischemic microangiopathy. The  cerebral and cerebellar volume are age-appropriate. There is no hydrocephalus. The midline structures are normal. No acute hemorrhage. VASCULAR: The major intracranial arterial and venous sinus flow voids are normal. Susceptibility-sensitive sequences show no chronic microhemorrhage or superficial siderosis. SKULL AND UPPER CERVICAL SPINE: Calvarial bone marrow signal is normal. There is no skull base mass. The visualized upper cervical spine and soft tissues are normal. SINUSES/ORBITS: There are no fluid levels or advanced mucosal thickening. The mastoid air cells and middle ear cavities are free of fluid. The orbits are normal. IMPRESSION: Multifocal ischemic infarction of the right MCA territory without acute hemorrhage or mass effect. Electronically Signed   By: Ulyses Jarred M.D.   On: 03/27/2019 02:09   Ct Code Stroke Cta Cerebral Perfusion W/wo Contrast  Result Date: 03/26/2019 CLINICAL DATA:  Left-sided weakness EXAM: CT ANGIOGRAPHY HEAD AND NECK CT PERFUSION BRAIN TECHNIQUE: Multidetector CT imaging of the head and neck was performed using the standard protocol during bolus administration of intravenous contrast. Multiplanar CT image reconstructions and MIPs were obtained to evaluate the vascular anatomy. Carotid stenosis measurements (when applicable) are obtained utilizing NASCET criteria, using the distal internal carotid diameter as the denominator. Multiphase CT imaging of the brain was performed following IV bolus contrast injection. Subsequent parametric perfusion maps were calculated using RAPID software. CONTRAST:  123mL OMNIPAQUE IOHEXOL 350 MG/ML SOLN COMPARISON:  Head CT 03/26/2019 FINDINGS: CTA NECK FINDINGS SKELETON: Multilevel degenerative disc disease and facet hypertrophy without bony spinal canal stenosis. OTHER NECK: Normal pharynx, larynx and major salivary glands. No cervical lymphadenopathy.  Unremarkable thyroid gland. UPPER CHEST: No pneumothorax or pleural effusion. No nodules or masses. AORTIC ARCH: There is mild calcific atherosclerosis of the aortic arch. There is no aneurysm, dissection or hemodynamically significant stenosis of the visualized ascending aorta and aortic arch. Normal variant aortic arch branching pattern with the brachiocephalic and left common carotid arteries sharing a common origin. The visualized proximal subclavian arteries are widely patent. RIGHT CAROTID SYSTEM: --Common carotid artery: Widely patent origin without common carotid artery dissection or aneurysm. --Internal carotid artery: No dissection, occlusion or aneurysm. Mild atherosclerotic calcification at the carotid bifurcation without hemodynamically significant stenosis. --External carotid artery: No acute abnormality. LEFT CAROTID SYSTEM: --Common carotid artery: Widely patent origin without common carotid artery dissection or aneurysm. --Internal carotid artery: No dissection, occlusion or aneurysm. Mild atherosclerotic calcification at the carotid bifurcation without hemodynamically significant stenosis. --External carotid artery: No acute abnormality. VERTEBRAL ARTERIES: Left dominant configuration. Both origins are clearly patent. No dissection, occlusion or flow-limiting stenosis to the skull base (V1-V3 segments). CTA HEAD FINDINGS POSTERIOR CIRCULATION: --Vertebral arteries: The right vertebral artery terminates in PICA. Left vertebral artery V4 segment is normal. --Posterior inferior cerebellar arteries (PICA): Normal --Anterior inferior cerebellar arteries (AICA): Patent origins from the basilar artery. --Basilar artery: Normal. --Superior cerebellar arteries: Normal. --Posterior cerebral arteries: Normal. The right PCA is predominantly supplied by the posterior communicating artery. ANTERIOR CIRCULATION: --Intracranial internal carotid arteries: Normal. --Anterior cerebral arteries (ACA): Normal. Both A1  segments are present. Patent anterior communicating artery (a-comm). --Middle cerebral arteries (MCA): On the right, there is occlusion of a mid to distal right M2 branch (series 11, image 24 and series 12, image 12). VENOUS SINUSES: As permitted by contrast timing, patent. ANATOMIC VARIANTS: None Review of the MIP images confirms the above findings. CT Brain Perfusion Findings: ASPECTS: 8 CBF (<30%) Volume: 55mL Perfusion (Tmax>6.0s) volume: 85mL Mismatch Volume: 75mL Infarction Location:Right MCA territory, predominantly the right frontal operculum IMPRESSION: 1. Occlusion of a  mid-distal right M2 branch, corresponding to the area of ischemia on the perfusion scan. These results were called by telephone at the time of interpretation on 03/26/2019 at 7:56 pm to Dr.ERIC [facility] , who verbally acknowledged these results. 2. 5 mL right MCA territory infarction, predominantly in the right frontal operculum, with 22 mL area of ischemic penumbra. 3. No hemodynamically significant stenosis of the carotid or vertebral arteries by the NASCET criteria. Aortic Atherosclerosis (ICD10-I70.0). Electronically Signed   By: Ulyses Jarred M.D.   On: 03/26/2019 20:07   Dg Chest Port 1 View  Result Date: 03/28/2019 CLINICAL DATA:  Abnormal respirations.  History of stroke. EXAM: PORTABLE CHEST 1 VIEW COMPARISON:  Single-view of the chest 03/26/2019. FINDINGS: Endotracheal tube has been removed. Mild right basilar atelectasis is seen. Lungs are otherwise clear. Cardiomegaly. Atherosclerosis. No pneumothorax or pleural effusion. IMPRESSION: Status post extubation. Cardiomegaly without acute disease. Right basilar atelectasis noted. Electronically Signed   By: Inge Rise M.D.   On: 03/28/2019 08:51   Dg Chest Port 1 View  Result Date: 03/26/2019 CLINICAL DATA:  Stroke EXAM: PORTABLE CHEST 1 VIEW COMPARISON:  None. FINDINGS: Endotracheal tube is 4 cm above the carina. NG tube is in the stomach. Heart is normal size. Increased  markings throughout the lungs, likely chronic/scarring. No confluent opacities or effusions. IMPRESSION: Chronic changes.  No acute cardiopulmonary disease. Electronically Signed   By: Rolm Baptise M.D.   On: 03/26/2019 23:13   Ct Head Code Stroke Wo Contrast  Result Date: 03/26/2019 CLINICAL DATA:  Code stroke.  Slurred speech EXAM: CT HEAD WITHOUT CONTRAST TECHNIQUE: Contiguous axial images were obtained from the base of the skull through the vertex without intravenous contrast. COMPARISON:  None. FINDINGS: Brain: There is no mass, hemorrhage or extra-axial collection. The size and configuration of the ventricles and extra-axial CSF spaces are normal. There is hypoattenuation of the periventricular white matter, most commonly indicating chronic ischemic microangiopathy. Vascular: No abnormal hyperdensity of the major intracranial arteries or dural venous sinuses. No intracranial atherosclerosis. Skull: The visualized skull base, calvarium and extracranial soft tissues are normal. Sinuses/Orbits: No fluid levels or advanced mucosal thickening of the visualized paranasal sinuses. No mastoid or middle ear effusion. The orbits are normal. ASPECTS [facility] Stroke Program Early CT Score) - Ganglionic level infarction (caudate, lentiform nuclei, internal capsule, insula, M1-M3 cortex): 5 - Supraganglionic infarction (M4-M6 cortex): 3 Total score (0-10 with 10 being normal): 8 IMPRESSION: 1. No intracranial hemorrhage. 2. ASPECTS is 8 These results were called by telephone at the time of interpretation on 03/26/2019 at 7:46 pm to provider Dr. Kerney Elbe, who verbally acknowledged these results. Electronically Signed   By: Ulyses Jarred M.D.   On: 03/26/2019 19:48   Vas [place] Lower Extremity Venous (dvt)  Result Date: 03/27/2019  Lower Venous Study Indications: Stroke.  Comparison Study: No priors. Performing Technologist: Oda Cogan RDMS, RVT  Examination Guidelines: A complete evaluation includes B-mode imaging,  spectral Doppler, color Doppler, and power Doppler as needed of all accessible portions of each vessel. Bilateral testing is considered an integral part of a complete examination. Limited examinations for reoccurring indications may be performed as noted.  +---------+---------------+---------+-----------+----------+--------------+ RIGHT    CompressibilityPhasicitySpontaneityPropertiesThrombus Aging +---------+---------------+---------+-----------+----------+--------------+ CFV      Full           Yes      Yes                                 +---------+---------------+---------+-----------+----------+--------------+  SFJ      Full                                                        +---------+---------------+---------+-----------+----------+--------------+ FV Prox  Full                                                        +---------+---------------+---------+-----------+----------+--------------+ FV Mid   Full                                                        +---------+---------------+---------+-----------+----------+--------------+ FV DistalFull                                                        +---------+---------------+---------+-----------+----------+--------------+ PFV      Full                                                        +---------+---------------+---------+-----------+----------+--------------+ POP      Full           Yes      Yes                                 +---------+---------------+---------+-----------+----------+--------------+ PTV      Full                                                        +---------+---------------+---------+-----------+----------+--------------+ PERO     Full                                                        +---------+---------------+---------+-----------+----------+--------------+   +---------+---------------+---------+-----------+----------+--------------+ LEFT      CompressibilityPhasicitySpontaneityPropertiesThrombus Aging +---------+---------------+---------+-----------+----------+--------------+ CFV      Full           Yes      Yes                                 +---------+---------------+---------+-----------+----------+--------------+ SFJ      Full                                                        +---------+---------------+---------+-----------+----------+--------------+  FV Prox  Full                                                        +---------+---------------+---------+-----------+----------+--------------+ FV Mid   Full                                                        +---------+---------------+---------+-----------+----------+--------------+ FV DistalFull                                                        +---------+---------------+---------+-----------+----------+--------------+ PFV      Full                                                        +---------+---------------+---------+-----------+----------+--------------+ POP      Full           Yes      Yes                                 +---------+---------------+---------+-----------+----------+--------------+ PTV      Full                                                        +---------+---------------+---------+-----------+----------+--------------+ PERO     Full                                                        +---------+---------------+---------+-----------+----------+--------------+     Summary: Right: There is no evidence of deep vein thrombosis in the lower extremity. No cystic structure found in the popliteal fossa. Left: There is no evidence of deep vein thrombosis in the lower extremity. A cystic structure is found in the popliteal fossa.  *See table(s) above for measurements and observations. Electronically signed by Monica Martinez MD on 03/27/2019 at 3:20:28 PM.    Final    Cerebral  Angiogram 03/27/2019 Occlusion of RT MCA superior division ant peri sylvian branch M2/M3 junction.   PHYSICAL EXAM    Temp:  [97.8 F (36.6 C)-98.9 F (37.2 C)] 98.3 F (36.8 C) (09/11 0400) Pulse Rate:  [43-88] 65 (09/11 0900) Resp:  [13-24] 20 (09/11 0900) BP: (111-147)/(48-107) 129/68 (09/11 0900) SpO2:  [94 %-100 %] 100 % (09/11 0900) Arterial Line BP: (144)/(115) 144/115 (09/10 1000) FiO2 (%):  [40 %] 40 % (09/10 1124)  General - Well nourished, well developed, no apparent distress.  Ophthalmologic - fundi not visualized due to noncooperation.  Cardiovascular -  Regular rate and rhythm.  Neuro - extubated, AAO x3, following all simple commands, no aphasia, mild dysarthria, able to name and repeat. With eye opening, eyes in slight right gaze position, left gaze incomplete, visual field full but has left simutagnosia, PERRL. Right facial droop.  Tongue protrusion midline. RUE 4+/5 and RLE 4+/5. LUE 3/5 and LLE 4/5. DTR 1+ and no babinski. Sensation symmetrical, coordination R FTN intact and gait not tested.  ASSESSMENT/PLAN [name] is a 85 y.o. female with history of HTN, CKD, AV stenosis, PSVT/PVCs, hypothyroidism and peripheral neuropathy presenting following a fall with slurred speech, left sided facial droop, right gaze preference and left arm weakness.   Stroke:  R MCA infarct due to right distal M2 occlusion, embolic secondary to PAF on tele in ICU  Code Stroke CT head No acute abnormality. ASPECTS 8    CTA head & neck occlusion mid-distal R M2 branch. No significant stenosis  CT perfusion 46mL R MCA territory infarct R frontal operculum w. 61mL penumbra  Cerebral angio occlusion R MCA M2/M3 jxn  CT CSpine no fx, subluxation  MRI  Multifocal R MCA territory infarct w/o hemorrhage or mass effect  2D Echo EF >65%. No source of embolus   LE venous doppler no DVT  LDL 80  HgbA1c 5.5  lovenox subq for VTE prophylaxis.   No antithrombotic prior to  admission, now on ASA 325mg  and plavix 75. Will consider DOAC in 3-5 days.   Therapy recommendations:  pending   Disposition:  pending   PAF with RVR Hx of SVT  Reported on cardizem in the past  PAF with RVR on tele in ICU  Resume cardizam after passing swallow  Now on ASA and plavix  Consider DOAC in 3-5 days  Hypertension  Home meds:  No listed home antihypertensives  Treated w/ Cleviprex post angio  BP now Stable . Resume cardizem home meds . Long-term BP goal normotensive  Hyperlipidemia  Home meds:  No statin  LDL 80, goal < 70  Add lipitor 20   Continue statin at discharge  Dysphagia . Secondary to stroke . On puree diet now . Speech on board . Resume home meds . MBS today with speech   Other Stroke Risk Factors  Advanced age  Family hx stroke   Non-rheumatic AV stenosis   Other Active Problems  Fall - CK ok  UA neg, UCx pending   Trop 86  Hypothyroid, TSH ok  Osteoporosis on denosumab as OP. C-Spine cleared.   Leukocytosis 16.1->15.8  Hospital day # 2  This patient is critically ill due to right MCA infarct, HTN, intubated on vent, SVT and at significant risk of neurological worsening, death form recurrent stroke, hemorrhagic conversion, heart failure, seizure, aspiration. This patient's care requires constant monitoring of vital signs, hemodynamics, respiratory and cardiac monitoring, review of multiple databases, neurological assessment, discussion with family, other specialists and medical decision making of high complexity. I spent 35 minutes of neurocritical care time in the care of this patient.    Rosalin Hawking, MD PhD Stroke Neurology 03/28/2019 9:31 AM   To contact Stroke Continuity provider, please refer to [email or web address] After hours, contact General Neurology

## 2019-03-28 NOTE — Consult Note (Signed)
[facility] Nurse wound consult note Reason for Consult:skin tear to right upper arm Wound type:trauma Pressure Injury POA: NA Measurement: 8.2cm x 0.4cm (at widest width) x 0.1cm Wound bed:red, mosit Drainage (amount, consistency, odor) scant serous Periwound: fragile, thin epidermis Dressing procedure/placement/frequency: Medical adhesive remover spray used to remove adherent Telfa pad. Wound cleansed with NS; a nonadherent dressing is applied. Nursing is provided with guidance via the Orders for twice daily application of white petrolatum gauze topped with dry gauze and secured with a silicone foam dressing.  [facility] nursing team will not follow, but will remain available to this patient, the nursing and medical teams.  Please re-consult if needed. Thanks, Maudie Flakes, MSN, RN, [place], Arther Abbott  Pager# [phone]

## 2019-03-28 NOTE — Progress Notes (Signed)
  Speech Language Pathology Treatment: Cognitive-Linquistic  Patient Details Name: Nancy Zamora MRN: [id number] DOB: Jul 03, 1931 Today's Date: 03/28/2019 Time: [id number] SLP Time Calculation (min) (ACUTE ONLY): 19 min  Assessment / Plan / Recommendation Clinical Impression  Pt was seen for cognitive-linguistic and dysarthria treatment and was cooperative during the session. She was educated regarding the nature of dysarthria, and compensatory strategies to improve speech intelligibility. Pt verbalized understanding regarding all areas of education. She used compensatory strategies at the phrase level with 50% accuracy increasing to 80% accuracy with mod cues for overarticulation and vocal intensity. She required mod cues for time management problems but demonstrated 100% accuracy with problem solving related to safety. SLP will continue to follow pt.     HPI HPI: Pt is an 85 y.o. female who lives independently and presented to teh ED secondary to fall from a standing position, slurred speech, left sided facial droop, right gaze preference and left arm weakness. MRI of the brain showed multifocal ischemic infarction of the right MCA territory without acute hemorrhage or mass effect.      SLP Plan  Continue with current plan of care  Patient needs continued Speech Lanaguage Pathology Services    Recommendations                   Follow up Recommendations: 24 hour supervision/assistance(Continued SLP services at level of care recommended by PT/OT) SLP Visit Diagnosis: Dysarthria and anarthria (R47.1);Cognitive communication deficit (R41.841) Plan: Continue with current plan of care       Zafira Munos I. Hardin Negus, [place], [place] Office number [phone] Pager [place] 03/28/2019, 10:29 AM

## 2019-03-28 NOTE — Progress Notes (Signed)
Pt arrived to the unit, VSS, NAD noted. MD notified, no new orders received.

## 2019-03-28 NOTE — Progress Notes (Signed)
Modified Barium Swallow Progress Note  Patient Details  Name: Nancy Zamora MRN: [id number] Date of Birth: 1931/07/10  Today's Date: 03/28/2019  Modified Barium Swallow completed.  Full report located under Chart Review in the Imaging Section.  Brief recommendations include the following:  Clinical Impression  Mild-moderate oropharyngeal dysphagia with decreased oral cohesion, laryngeal penetration and residual. Control and propulsion of boluses was challenging for pt and she used head extention to assist in bolus transfer to oropharynx. Anterior leakage on left side and lingual residue spilling into pharynx, laryngeal vestibule reaching vocal cords without adequate sensation to expel. Nectar thick consistently penetrated due to inadequate timing and incomplete epiglottic inversion. Cues for cough were ineffective to remove. Vallecular residue ranged mild to max and mild in pyriform sinuses. Of note pt was sleepy this afternoon in comparison to bedside assessment. Dys 1 (puree), honey thick recommended, with assist, two swallows and pills whole in puree. Son educated in room after study and voiced understanding of recommendations.         Swallow Evaluation Recommendations       SLP Diet Recommendations: Dysphagia 1 (Puree) solids;Honey thick liquids   Liquid Administration via: Cup   Medication Administration: Whole meds with puree   Supervision: Full supervision/cueing for compensatory strategies;Patient able to self feed;Staff to assist with self feeding   Compensations: Slow rate;Small sips/bites;Multiple dry swallows after each bite/sip;Lingual sweep for clearance of pocketing   Postural Changes: Seated upright at 90 degrees   Oral Care Recommendations: Oral care BID        [place] Siren 03/28/2019,3:12 PM  Orbie Pyo Colvin Caroli.Ed Risk analyst [phone] Office [phone]

## 2019-03-28 NOTE — Evaluation (Signed)
Clinical/Bedside Swallow Evaluation Patient Details  Name: Nancy Zamora MRN: [id number] Date of Birth: May 20, 1931  Today's Date: 03/28/2019 Time: SLP Start Time (ACUTE ONLY): 1045 SLP Stop Time (ACUTE ONLY): 1105 SLP Time Calculation (min) (ACUTE ONLY): 20 min  Past Medical History: History reviewed. No pertinent past medical history. Past Surgical History:  Past Surgical History:  Procedure Laterality Date  . RADIOLOGY WITH ANESTHESIA Left 03/26/2019   Procedure: RADIOLOGY WITH ANESTHESIA;  Surgeon: Luanne Bras, MD;  Location: [facility];  Service: Radiology;  Laterality: Left;   HPI:  Pt is an 85 y.o. female who lives independently and presented to teh ED secondary to fall from a standing position, slurred speech, left sided facial droop, right gaze preference and left arm weakness. MRI of the brain showed multifocal ischemic infarction of the right MCA territory without acute hemorrhage or mass effect. Intubated 9/9-9/10.   Assessment / Plan / Recommendation Clinical Impression  Pt presents with dysarthric speech, decreased labial seal and ROM on left with pocketing of cracker requiring assist to remove. Suspect inadequate protective mechanisms during swallow sequence marked by immediate coughs with thin. Weak volitional cough. Nectar thick appeared to mitigate aspiration risk however MBS recommended to determine safety and ability to initiate po's.    SLP Visit Diagnosis: Dysphagia, unspecified (R13.10)    Aspiration Risk  Moderate aspiration risk    Diet Recommendation NPO except meds   Medication Administration: Whole meds with puree    Other  Recommendations Oral Care Recommendations: Oral care QID   Follow up Recommendations 24 hour supervision/assistance(Continued SLP services at level of care recommended by PT/OT)      Frequency and Duration min 2x/week          Prognosis        Swallow Study   General HPI: Pt is an 85 y.o. female who lives independently and  presented to teh ED secondary to fall from a standing position, slurred speech, left sided facial droop, right gaze preference and left arm weakness. MRI of the brain showed multifocal ischemic infarction of the right MCA territory without acute hemorrhage or mass effect. Intubated 9/9-9/10. Type of Study: Bedside Swallow Evaluation Previous Swallow Assessment: none Diet Prior to this Study: NPO Temperature Spikes Noted: No Respiratory Status: Nasal cannula History of Recent Intubation: Yes Length of Intubations (days): 1 days Date extubated: 03/27/19 Behavior/Cognition: Alert;Cooperative;Pleasant mood Oral Cavity Assessment: Dry Oral Care Completed by SLP: Yes Oral Cavity - Dentition: Adequate natural dentition Vision: Functional for self-feeding(left neglect) Self-Feeding Abilities: Needs assist;Needs set up Patient Positioning: Upright in bed Baseline Vocal Quality: Low vocal intensity Volitional Cough: Weak    Oral/Motor/Sensory Function Overall Oral Motor/Sensory Function: Mild impairment Facial ROM: Reduced left;Suspected CN VII (facial) dysfunction Facial Symmetry: Abnormal symmetry left;Suspected CN VII (facial) dysfunction Facial Strength: Reduced left;Suspected CN VII (facial) dysfunction Facial Sensation: Within Functional Limits Lingual ROM: Reduced left;Suspected CN XII (hypoglossal) dysfunction Lingual Symmetry: Within Functional Limits Lingual Strength: Reduced   Ice Chips Ice chips: Not tested   Thin Liquid Thin Liquid: Impaired Presentation: Cup Oral Phase Impairments: Reduced labial seal Oral Phase Functional Implications: Left anterior spillage Pharyngeal  Phase Impairments: Cough - Immediate;Cough - Delayed;Throat Clearing - Immediate    Nectar Thick Nectar Thick Liquid: Impaired Presentation: Cup Oral phase functional implications: Left anterior spillage Pharyngeal Phase Impairments: (none)   Honey Thick Honey Thick Liquid: Not tested   Puree Puree:  Impaired Presentation: Spoon Oral Phase Impairments: Reduced labial seal Oral Phase Functional Implications: Other (comment)(labial residue) Pharyngeal  Phase Impairments: (none)   Solid     Solid: Impaired Oral Phase Impairments: Reduced lingual movement/coordination;Impaired mastication Oral Phase Functional Implications: Left lateral sulci pocketing      [place] Siren 03/28/2019,2:37 PM  Orbie Pyo Colvin Caroli.Ed Risk analyst [phone] Office [phone]

## 2019-03-29 ENCOUNTER — Inpatient Hospital Stay (HOSPITAL_COMMUNITY): Payer: PPO

## 2019-03-29 DIAGNOSIS — E440 Moderate protein-calorie malnutrition: Secondary | ICD-10-CM | POA: Diagnosis present

## 2019-03-29 LAB — BASIC METABOLIC PANEL
Anion gap: 10 (ref 5–15)
BUN: 14 mg/dL (ref 8–23)
CO2: 21 mmol/L — ABNORMAL LOW (ref 22–32)
Calcium: 8 mg/dL — ABNORMAL LOW (ref 8.9–10.3)
Chloride: 105 mmol/L (ref 98–111)
Creatinine, Ser: 0.76 mg/dL (ref 0.44–1.00)
GFR calc Af Amer: >60 mL/min (ref >60)
GFR calc non Af Amer: >60 mL/min (ref >60)
Glucose, Bld: 89 mg/dL (ref 70–99)
Potassium: 3.7 mmol/L (ref 3.5–5.1)
Sodium: 136 mmol/L (ref 135–145)

## 2019-03-29 LAB — GLUCOSE, CAPILLARY
Glucose-Capillary: 101 mg/dL — ABNORMAL HIGH (ref 70–99)
Glucose-Capillary: 104 mg/dL — ABNORMAL HIGH (ref 70–99)
Glucose-Capillary: 113 mg/dL — ABNORMAL HIGH (ref 70–99)
Glucose-Capillary: 120 mg/dL — ABNORMAL HIGH (ref 70–99)
Glucose-Capillary: 132 mg/dL — ABNORMAL HIGH (ref 70–99)
Glucose-Capillary: 83 mg/dL (ref 70–99)
Glucose-Capillary: 83 mg/dL (ref 70–99)

## 2019-03-29 LAB — CBC
HCT: 33.4 % — ABNORMAL LOW (ref 36.0–46.0)
Hemoglobin: 11 g/dL — ABNORMAL LOW (ref 12.0–15.0)
MCH: 30.9 pg (ref 26.0–34.0)
MCHC: 32.9 g/dL (ref 30.0–36.0)
MCV: 93.8 fL (ref 80.0–100.0)
Platelets: 153 10*3/uL (ref 150–400)
RBC: 3.56 MIL/uL — ABNORMAL LOW (ref 3.87–5.11)
RDW: 13.5 % (ref 11.5–15.5)
WBC: 14.6 10*3/uL — ABNORMAL HIGH (ref 4.0–10.5)
nRBC: 0 % (ref 0.0–0.2)

## 2019-03-29 LAB — PHOSPHORUS: Phosphorus: 2.1 mg/dL — ABNORMAL LOW (ref 2.5–4.6)

## 2019-03-29 LAB — MAGNESIUM: Magnesium: 2.1 mg/dL (ref 1.7–2.4)

## 2019-03-29 MED ORDER — CELECOXIB 100 MG PO CAPS
100.0000 mg | ORAL_CAPSULE | Freq: Every day | ORAL | Status: DC
  Administered 2019-03-29 – 2019-03-31 (×3): 100 mg via ORAL
  Filled 2019-03-29 (×4): qty 1

## 2019-03-29 MED ORDER — METOPROLOL TARTRATE 5 MG/5ML IV SOLN
5.0000 mg | Freq: Once | INTRAVENOUS | Status: AC
  Administered 2019-03-29: 23:00:00 5 mg via INTRAVENOUS

## 2019-03-29 MED ORDER — METOPROLOL TARTRATE 5 MG/5ML IV SOLN
INTRAVENOUS | Status: AC
  Administered 2019-03-29: 5 mg via INTRAVENOUS
  Filled 2019-03-29: qty 5

## 2019-03-29 NOTE — Progress Notes (Signed)
Telemetry called RN to report pt Afib on the monitor. RN in room noted pt in Afib on monitor; pt sleeping in bed; when asked of any discomfort, pt denies. Pt noted to be Afib running 120's-140's on the monitor sustaining. MD paged and notified; new order received. Will continue to closely monitor pt. Delia Heady RN

## 2019-03-29 NOTE — Progress Notes (Signed)
STROKE TEAM PROGRESS NOTE   INTERVAL HISTORY Pt sitting in bed, her son is  at bedside. Persistent mild weakness left UE. cleared for a diet.  She states that she takes Celebrex at home for her shoulder and arthritic pain and she has tried other medications like Aleve and tramadol Tylenol which have not worked for her in the past.  Vitals:   03/29/19 0359 03/29/19 0705 03/29/19 1105 03/29/19 1511  BP: (!) 162/89 (!) 169/74 (!) 175/76   Pulse: 86 79 81 85  Resp: (!) 21 (!) 25 16 (!) 24  Temp: 98.6 F (37 C)  98.2 F (36.8 C)   TempSrc: Oral  Oral   SpO2: 98% 98% 99% 97%  Weight:      Height:        CBC:  Recent Labs  Lab 03/26/19 1858  03/27/19 0235 03/28/19 0154 03/29/19 0340  WBC 16.2*  --  16.1* 15.8* 14.6*  NEUTROABS 13.9*  --  12.9*  --   --   HGB 12.5   < > 13.5 11.8* 11.0*  HCT 37.4   < > 40.7 36.1 33.4*  MCV 93.5  --  92.1 93.3 93.8  PLT 190  --  202 164 153   < > = values in this interval not displayed.    Basic Metabolic Panel:  Recent Labs  Lab 03/28/19 0154 03/29/19 0340  NA 137 136  K 4.0 3.7  CL 106 105  CO2 23 21*  GLUCOSE 102* 89  BUN 14 14  CREATININE 0.81 0.76  CALCIUM 8.2* 8.0*  MG 1.9 2.1  PHOS 3.1 2.1*   Lipid Panel:     Component Value Date/Time   CHOL 228 (H) 03/27/2019 0235   TRIG 56 03/28/2019 0154   HDL 133 03/27/2019 0235   CHOLHDL 1.7 03/27/2019 0235   VLDL 15 03/27/2019 0235   LDLCALC 80 03/27/2019 0235   HgbA1c:  Lab Results  Component Value Date   HGBA1C 5.5 03/27/2019   Urine Drug Screen:     Component Value Date/Time   LABOPIA NONE DETECTED 03/26/2019 2030   COCAINSCRNUR NONE DETECTED 03/26/2019 2030   LABBENZ NONE DETECTED 03/26/2019 2030   AMPHETMU NONE DETECTED 03/26/2019 2030   THCU NONE DETECTED 03/26/2019 2030   LABBARB NONE DETECTED 03/26/2019 2030    Alcohol Level     Component Value Date/Time   [facility] <10 03/26/2019 1858    IMAGING  Dg Chest Port 1 View 03/29/2019 IMPRESSION:  Minimal hazy left  base opacification which may be due to small effusion/atelectasis.  Dg Chest Port 1 View 03/28/2019 IMPRESSION:  Status post extubation. Cardiomegaly without acute disease. Right basilar atelectasis noted.  Dg Swallowing Func-speech Pathology 03/28/2019 Objective Swallowing Evaluation: Type of Study: MBS-Modified Barium Swallow Study  Patient Details Name: Jenova Thuman MRN: [id number] Date of Birth: 09-11-1930 Today's Date: 03/28/2019 Time: SLP Start Time (ACUTE ONLY): [id number] -SLP Stop Time (ACUTE ONLY): 1323 SLP Time Calculation (min) (ACUTE ONLY): 20 min Past Medical History: No past medical history on file. Past Surgical History: Past Surgical History: Procedure Laterality Date . RADIOLOGY WITH ANESTHESIA Left 03/26/2019  Procedure: RADIOLOGY WITH ANESTHESIA;  Surgeon: Luanne Bras, MD;  Location: [facility];  Service: Radiology;  Laterality: Left; HPI: Pt is an 85 y.o. female who lives independently and presented to teh ED secondary to fall from a standing position, slurred speech, left sided facial droop, right gaze preference and left arm weakness. MRI of the brain showed multifocal ischemic infarction of the  right MCA territory without acute hemorrhage or mass effect. Intubated 9/9-9/10.  No data recorded Assessment / Plan / Recommendation CHL IP CLINICAL IMPRESSIONS 03/28/2019 Clinical Impression Mild-moderate oropharyngeal dysphagia with decreased oral cohesion, laryngeal penetration and residual. Control and propulsion of boluses was challenging for pt and she used head extention to assist in bolus transfer to oropharynx. Anterior leakage on left side and lingual residue spilling into pharynx, laryngeal vestibule reaching vocal cords without adequate sensation to expel. Nectar thick consistently penetrated due to inadequate timing and incomplete epiglottic inversion. Cues for cough were ineffective to remove. Vallecular residue ranged mild to max and mild in pyriform sinuses. Of note pt was sleepy this  afternoon in comparison to bedside assessment. Dys 1 (puree), honey thick recommended, with assist, two swallows and pills whole in puree. Son educated in room after study and voiced understanding of recommendations.       SLP Visit Diagnosis Dysphagia, oropharyngeal phase (R13.12) Attention and concentration deficit following -- Frontal lobe and executive function deficit following -- Impact on safety and function Moderate aspiration risk   CHL IP TREATMENT RECOMMENDATION 03/28/2019 Treatment Recommendations Therapy as outlined in treatment plan below   Prognosis 03/28/2019 Prognosis for Safe Diet Advancement Good Barriers to Reach Goals -- Barriers/Prognosis Comment -- CHL IP DIET RECOMMENDATION 03/28/2019 SLP  Diet Recommendations Dysphagia 1 (Puree) solids;Honey thick liquids Liquid Administration via Cup Medication Administration Whole meds with puree Compensations Slow rate;Small sips/bites;Multiple dry swallows after each bite/sip;Lingual sweep for clearance of pocketing Postural Changes Seated upright at 90 degrees   CHL IP OTHER RECOMMENDATIONS 03/28/2019 Recommended Consults -- Oral Care Recommendations Oral care BID Other Recommendations --   CHL IP FOLLOW UP RECOMMENDATIONS 03/28/2019 Follow up Recommendations 24 hour supervision/assistance   CHL IP FREQUENCY AND DURATION 03/28/2019 Speech Therapy Frequency (ACUTE ONLY) min 2x/week Treatment Duration 2 weeks      CHL IP ORAL PHASE 03/28/2019 Oral Phase Impaired Oral - Pudding Teaspoon -- Oral - Pudding Cup -- Oral - Honey Teaspoon Left anterior bolus loss;Weak lingual manipulation;Reduced posterior propulsion;Delayed oral transit Oral - Honey Cup Left anterior bolus loss;Weak lingual manipulation;Reduced posterior propulsion;Delayed oral transit Oral - Nectar Teaspoon -- Oral - Nectar Cup Lingual/palatal residue;Weak lingual manipulation;Left anterior bolus loss;Reduced posterior propulsion;Decreased bolus cohesion;Delayed oral transit Oral - Nectar Straw --  Oral - Thin Teaspoon -- Oral - Thin Cup -- Oral - Thin Straw -- Oral - Puree Decreased bolus cohesion;Delayed oral transit;Reduced posterior propulsion Oral - Mech Soft -- Oral - Regular -- Oral - Multi-Consistency -- Oral - Pill -- Oral Phase - Comment --  CHL IP PHARYNGEAL PHASE 03/28/2019 Pharyngeal Phase Impaired Pharyngeal- Pudding Teaspoon -- Pharyngeal -- Pharyngeal- Pudding Cup -- Pharyngeal -- Pharyngeal- Honey Teaspoon Pharyngeal residue - valleculae;Pharyngeal residue - pyriform Pharyngeal -- Pharyngeal- Honey Cup Penetration/Aspiration during swallow;Pharyngeal residue - valleculae;Pharyngeal residue - pyriform Pharyngeal Material enters airway, remains ABOVE vocal cords and not ejected out Pharyngeal- Nectar Teaspoon -- Pharyngeal -- Pharyngeal- Nectar Cup Pharyngeal residue - pyriform;Pharyngeal residue - valleculae;Penetration/Aspiration before swallow Pharyngeal Material enters airway, remains ABOVE vocal cords then ejected out Pharyngeal- Nectar Straw -- Pharyngeal -- Pharyngeal- Thin Teaspoon -- Pharyngeal -- Pharyngeal- Thin Cup -- Pharyngeal -- Pharyngeal- Thin Straw -- Pharyngeal -- Pharyngeal- Puree Pharyngeal residue - valleculae;Delayed swallow initiation-pyriform sinuses Pharyngeal -- Pharyngeal- Mechanical Soft -- Pharyngeal -- Pharyngeal- Regular -- Pharyngeal -- Pharyngeal- Multi-consistency -- Pharyngeal -- Pharyngeal- Pill -- Pharyngeal -- Pharyngeal Comment --  CHL IP CERVICAL ESOPHAGEAL PHASE 03/28/2019 Cervical Esophageal Phase WFL Pudding Teaspoon --  Pudding Cup -- Honey Teaspoon -- Honey Cup -- Nectar Teaspoon -- Nectar Cup -- Nectar Straw -- Thin Teaspoon -- Thin Cup -- Thin Straw -- Puree -- Mechanical Soft -- Regular -- Multi-consistency -- Pill -- Cervical Esophageal Comment -- [place] Siren 03/28/2019, 3:11 PM Orbie Pyo Litaker M.Ed Actor Pager [phone] Office [phone]              Cerebral Angiogram 03/27/2019 Occlusion of RT MCA  superior division ant peri sylvian branch M2/M3 junction.   PHYSICAL EXAM    Temp:  [98.1 F (36.7 C)-98.6 F (37 C)] 98.2 F (36.8 C) (09/12 1105) Pulse Rate:  [63-86] 85 (09/12 1511) Resp:  [16-25] 24 (09/12 1511) BP: (130-175)/(69-89) 175/76 (09/12 1105) SpO2:  [95 %-99 %] 97 % (09/12 1511)  General - Well nourished, well developed elderly Caucasian lady, no apparent distress.  Ophthalmologic - fundi not visualized due to noncooperation.  Cardiovascular - Regular rate and rhythm.  Neuro -AAO x3, following all simple commands, no aphasia, mild dysarthria, able to name and repeat. With eye opening, eyes in slight right gaze position, left gaze incomplete, visual field full but has left simutagnosia, PERRL.  Left facial droop.  Tongue protrusion midline. RUE 4+/5 and RLE 4+/5. LUE 3/5 and LLE 4/5. DTR 1+ and no babinski. Sensation symmetrical, coordination R FTN intact and gait not tested.  ASSESSMENT/PLAN Ms. Kadejia Darrington is a 85 y.o. female with history of HTN, CKD, AV stenosis, PSVT/PVCs, hypothyroidism and peripheral neuropathy presenting following a fall with slurred speech, left sided facial droop, right gaze preference and left arm weakness.   Stroke:  R MCA infarct due to right distal M2 occlusion, embolic secondary to PAF on tele in ICU  Code Stroke CT head No acute abnormality. ASPECTS 8    CTA head & neck occlusion mid-distal R M2 branch. No significant stenosis  CT perfusion 45mL R MCA territory infarct R frontal operculum w. 67mL penumbra  Cerebral angio occlusion R MCA M2/M3 jxn  CT CSpine no fx, subluxation  MRI  Multifocal R MCA territory infarct w/o hemorrhage or mass effect  2D Echo EF >65%. No source of embolus   LE venous doppler no DVT  LDL 80  HgbA1c 5.5  lovenox subq for VTE prophylaxis.   No antithrombotic prior to admission, now on ASA 325mg  and plavix 75. Will consider DOAC in 3-5 days.   Therapy recommendations:  SNF  recommended  Disposition:  pending   PAF with RVR Hx of SVT  Reported on cardizem in the past  PAF with RVR on tele in ICU  Resume cardizam after passing swallow -> Cardizem CD 180 mg daily started 03/28/19  Now on ASA and plavix  Consider DOAC in 3-5 days  Hypertension  Home meds:  No listed home antihypertensives  Treated w/ Cleviprex post angio  BP now Stable . Resume cardizem home meds . Long-term BP goal normotensive  Hyperlipidemia  Home meds:  No statin  LDL 80, goal < 70  Add lipitor 20   Continue statin at discharge  Dysphagia . Secondary to stroke . On puree diet now . Speech on board . Resume home meds . MBS with speech - see above   Other Stroke Risk Factors  Advanced age  Family hx stroke   Non-rheumatic AV stenosis   Other Active Problems  Fall - CK ok  UA neg, UCx - 03/27/19 - no growth  Trop 23  Hypothyroid, TSH ok  Osteoporosis on denosumab  as OP. C-Spine cleared.   Leukocytosis 16.1->15.8->14.6 (afebrile)  Chest x-ray - 03/29/2019 - Minimal hazy left base opacification which may be due to small effusion/atelectasis.  Hospital day # 3  I had a long discussion with the patient and his son regarding increased bleeding risk with Celebrex in the setting of an acute stroke but the patient seems to be in discomfort due to arthritic pain and states that other medications have not worked well for her and she is willing to take the additional risk and she and her son both are in agreement with plan to start Celebrex.  Continue aspirin for now and change over to [place] in 3 to 5 days post stroke, greater than 50% time during this 25-minute visit was spent on counseling and coordination of care about her embolic stroke and risk-benefit of Celebrex after an acute stroke and answering questions Antony Contras, MD To contact Stroke Continuity provider, please refer to [email or web address] After hours, contact General Neurology

## 2019-03-29 NOTE — Progress Notes (Signed)
  Speech Language Pathology Treatment: Dysphagia  Patient Details Name: Nancy Zamora MRN: [id number] DOB: [birth year] Today's Date: 03/29/2019 Time: [id number] SLP Time Calculation (min) (ACUTE ONLY): 22 min  Assessment / Plan / Recommendation Clinical Impression  Pt seen for skilled intervention with Dysphagia 1/honey-thickened liquids with one incidence of delayed cough after larger bite of magic cup; pt with minimal left sulci pocketing, but cleared with lingual sweep with min verbal cues; honey-thickened liquids provided by tsp without overt s/s of aspiration noted; educated pt/son re: need for swallowing precautions/strategies during meals including small bites/sips, upright 90 degrees, repetitive swallows, and lingual left sweep intermittently during meals.  Pt/son in agreement with plan; ST will continue efforts for dysarthria/dysphagia tx while in acute setting.  HPI HPI: Pt is an 85 y.o. female who lives independently and presented to teh ED secondary to fall from a standing position, slurred speech, left sided facial droop, right gaze preference and left arm weakness. MRI of the brain showed multifocal ischemic infarction of the right MCA territory without acute hemorrhage or mass effect. Intubated 9/9-9/10.      SLP Plan  Continue with current plan of care       Recommendations  Diet recommendations: Honey-thick liquid;Dysphagia 1 (puree) Liquids provided via: Teaspoon Medication Administration: Whole meds with puree Supervision: Staff to assist with self feeding Compensations: Slow rate;Small sips/bites;Multiple dry swallows after each bite/sip;Lingual sweep for clearance of pocketing                General recommendations: Other(comment)(TBD) Oral Care Recommendations: Oral care BID Follow up Recommendations: 24 hour supervision/assistance SLP Visit Diagnosis: Dysphagia, oropharyngeal phase (R13.12) Plan: Continue with current plan of care                       Elvina Sidle, M.S., CCC-SLP 03/29/2019, 2:46 PM

## 2019-03-29 NOTE — Progress Notes (Signed)
Physical Therapy Treatment Patient Details Name: Nancy Zamora MRN: [id number] DOB: 1931/07/10 Today's Date: 03/29/2019    History of Present Illness Pt is an 85 y.o. female who presents to the ED secondary to fall from a standing position; noted to have slurred speech, left sided facial droop, right gaze preference and left arm weakness. MRI of the brain-multifocal ischemic infarction of the right MCA territory without acute hemorrhage or mass effect. PMH includes HTN, CKD, AV stenosis, PSVT/PVCs, hypothyroidism, peripheral neuropathy, and fx of LUE and LLE.    PT Comments    Patient seen for mobility progression. This session focused on functional transfer/gait training. Pt requires min/mod A +2 for OOB mobility. Pt is able to take steps to get from EOB to recliner. Continues to present with L side weakness (UE>LE), L inattention, and R gaze preference with ability to track across midline with cues. Continue to progress as tolerated.     Follow Up Recommendations  SNF;Supervision for mobility/OOB     Equipment Recommendations  Other (comment)(defer)    Recommendations for Other Services       Precautions / Restrictions Precautions Precautions: Fall Restrictions Weight Bearing Restrictions: No    Mobility  Bed Mobility Overal bed mobility: Needs Assistance Bed Mobility: Rolling;Sidelying to Sit Rolling: Mod assist Sidelying to sit: Mod assist       General bed mobility comments: cues for sequencing and hand over hand assist to reach for rail going toward L side; assist to bring bilat LE/hips to EOB and to elevate trunk into sitting; pt attempting to bring both legs to edge but couldn't not get all the way there  Transfers Overall transfer level: Needs assistance Equipment used: 2 person hand held assist Transfers: Sit to/from Stand;Stand Pivot Transfers Sit to Stand: Min assist;+2 physical assistance Stand pivot transfers: Mod assist;+2 physical assistance        General transfer comment: assist to power up into standing and then for balance when transfering to recliner  Ambulation/Gait Ambulation/Gait assistance: Mod assist;+2 physical assistance Gait Distance (Feet): 2 Feet Assistive device: 2 person hand held assist Gait Pattern/deviations: Step-to pattern;Decreased step length - right;Decreased step length - left;Shuffle     General Gait Details: multimodal cues for upright posture and weight shifting and vc for sequencing; pt able to take short steps away from EOB and then over to recliner   Stairs             Wheelchair Mobility    Modified Rankin (Stroke Patients Only) Modified Rankin (Stroke Patients Only) Pre-Morbid Rankin Score: Slight disability Modified Rankin: Moderately severe disability     Balance Overall balance assessment: Needs assistance Sitting-balance support: No upper extremity supported;Feet supported Sitting balance-Leahy Scale: Fair Sitting balance - Comments: Able to sit EOB with close min guard for safety   Standing balance support: During functional activity Standing balance-Leahy Scale: Poor                              Cognition Arousal/Alertness: Awake/alert Behavior During Therapy: WFL for tasks assessed/performed Overall Cognitive Status: Impaired/Different from baseline                                 General Comments:  Right gaze preference. Left inattention. Able to gaze midline and to the left but not sustain. Following single step cues consistently       Exercises  General Comments General comments (skin integrity, edema, etc.): sking tear R forearm with dressing       Pertinent Vitals/Pain Pain Assessment: Faces Faces Pain Scale: Hurts a little bit Pain Location: "all over" Pain Descriptors / Indicators: Sore Pain Intervention(s): Limited activity within patient's tolerance;Monitored during session;Repositioned    Home Living                       Prior Function            PT Goals (current goals can now be found in the care plan section) Acute Rehab PT Goals Patient Stated Goal: to be able to take care of myself, I am very independent Progress towards PT goals: Progressing toward goals    Frequency    Min 3X/week      PT Plan Current plan remains appropriate    Co-evaluation              AM-PAC PT "6 Clicks" Mobility   Outcome Measure  Help needed turning from your back to your side while in a flat bed without using bedrails?: A Lot Help needed moving from lying on your back to sitting on the side of a flat bed without using bedrails?: A Lot Help needed moving to and from a bed to a chair (including a wheelchair)?: A Lot Help needed standing up from a chair using your arms (e.g., wheelchair or bedside chair)?: A Lot Help needed to walk in hospital room?: A Lot Help needed climbing 3-5 steps with a railing? : Total 6 Click Score: 11    End of Session Equipment Utilized During Treatment: Gait belt Activity Tolerance: Patient tolerated treatment well Patient left: in chair;with call bell/phone within reach;with chair alarm set;with family/visitor present Nurse Communication: Mobility status PT Visit Diagnosis: Hemiplegia and hemiparesis;Difficulty in walking, not elsewhere classified (R26.2) Hemiplegia - Right/Left: Left Hemiplegia - dominant/non-dominant: Non-dominant Hemiplegia - caused by: Cerebral infarction     Time: 1100-1135 PT Time Calculation (min) (ACUTE ONLY): 35 min  Charges:  $Gait Training: 8-22 mins $Therapeutic Activity: 8-22 mins                     Earney Navy, PTA Acute Rehabilitation Services Pager: [phone] Office: [phone]     Darliss Cheney 03/29/2019, 12:01 PM

## 2019-03-30 ENCOUNTER — Inpatient Hospital Stay (HOSPITAL_COMMUNITY): Payer: PPO

## 2019-03-30 LAB — CBC
HCT: 34.4 % — ABNORMAL LOW (ref 36.0–46.0)
Hemoglobin: 11.5 g/dL — ABNORMAL LOW (ref 12.0–15.0)
MCH: 30.9 pg (ref 26.0–34.0)
MCHC: 33.4 g/dL (ref 30.0–36.0)
MCV: 92.5 fL (ref 80.0–100.0)
Platelets: 153 10*3/uL (ref 150–400)
RBC: 3.72 MIL/uL — ABNORMAL LOW (ref 3.87–5.11)
RDW: 13.3 % (ref 11.5–15.5)
WBC: 12.4 10*3/uL — ABNORMAL HIGH (ref 4.0–10.5)
nRBC: 0 % (ref 0.0–0.2)

## 2019-03-30 LAB — BASIC METABOLIC PANEL
Anion gap: 8 (ref 5–15)
BUN: 13 mg/dL (ref 8–23)
CO2: 21 mmol/L — ABNORMAL LOW (ref 22–32)
Calcium: 7.9 mg/dL — ABNORMAL LOW (ref 8.9–10.3)
Chloride: 106 mmol/L (ref 98–111)
Creatinine, Ser: 0.58 mg/dL (ref 0.44–1.00)
GFR calc Af Amer: >60 mL/min (ref >60)
GFR calc non Af Amer: >60 mL/min (ref >60)
Glucose, Bld: 95 mg/dL (ref 70–99)
Potassium: 3.8 mmol/L (ref 3.5–5.1)
Sodium: 135 mmol/L (ref 135–145)

## 2019-03-30 LAB — GLUCOSE, CAPILLARY
Glucose-Capillary: 107 mg/dL — ABNORMAL HIGH (ref 70–99)
Glucose-Capillary: 98 mg/dL (ref 70–99)
Glucose-Capillary: 149 mg/dL — ABNORMAL HIGH (ref 70–99)
Glucose-Capillary: 60 mg/dL — ABNORMAL LOW (ref 70–99)
Glucose-Capillary: 86 mg/dL (ref 70–99)
Glucose-Capillary: 109 mg/dL — ABNORMAL HIGH (ref 70–99)
Glucose-Capillary: 90 mg/dL (ref 70–99)

## 2019-03-30 NOTE — NC FL2 (Signed)
[place] LEVEL OF CARE SCREENING TOOL     IDENTIFICATION  Patient Name: Nancy Zamora Birthdate: May 23, 1931 Sex: female Admission Date (Current Location): 03/26/2019  [facility] and [place] Number:  Herbalist and Address:  The . [facility], [place] [address] [place], [place] [address]      Provider Number: [id number]  Attending Physician Name and Address:  Nancy Hawking, MD  Relative Name and Phone Number:  Nancy, Zamora, [phone]    Current Level of Care: Hospital Recommended Level of Care: [place] Prior Approval Number:    Date Approved/Denied: 03/05/15 PASRR Number: [id number]  Discharge Plan: SNF    Current Diagnoses: Patient Active Problem List   Diagnosis Date Noted  . Malnutrition of moderate degree 03/29/2019  . Airway intubation performed without difficulty   . Acute embolic stroke ([place]) [id number]  . Middle cerebral artery embolism, right 03/26/2019    Orientation RESPIRATION BLADDER Height & Weight     Self, Time, Situation, Place  Normal Incontinent, External catheter Weight: 114 lb 10.2 oz (52 kg) Height:  5\' 2"  (157.5 cm)  BEHAVIORAL SYMPTOMS/MOOD NEUROLOGICAL BOWEL NUTRITION STATUS      Continent Diet(Dys 1 diet, thin liquids)  AMBULATORY STATUS COMMUNICATION OF NEEDS Skin   Limited Assist Verbally Bruising, Skin abrasions(bruising on arm/leg; skin tear on arm (dressing in place); left closed incision on groin)                       Personal Care Assistance Level of Assistance  Bathing, Feeding, Dressing, Total care Bathing Assistance: Limited assistance Feeding assistance: Limited assistance Dressing Assistance: Limited assistance Total Care Assistance: Limited assistance   Functional Limitations Info  Sight, Hearing, Speech Sight Info: Adequate Hearing Info: Adequate Speech Info: Adequate    SPECIAL CARE FACTORS FREQUENCY  PT (By licensed PT), OT (By licensed OT), Speech  therapy     PT Frequency: 5x/wk OT Frequency: 5x/wk     Speech Therapy Frequency: 2x/wk      Contractures Contractures Info: Not present    Additional Factors Info  Code Status, Allergies, Insulin Sliding Scale Code Status Info: Full Code Allergies Info: Chocolate, Gabapentin, Penicillins, Tetracyclines & Related, Tape   Insulin Sliding Scale Info: insulin aspart novolog 0-9 units every 4 hours       Current Medications (03/30/2019):  This is the current hospital active medication list Current Facility-Administered Medications  Medication Dose Route Frequency Provider Last Rate Last Dose  . 0.9 %  sodium chloride infusion   Intravenous Continuous Nancy Hawking, MD 40 mL/hr at 03/30/19 1500    . acetaminophen (TYLENOL) tablet 650 mg  650 mg Oral Q4H PRN Deveshwar, Willaim Rayas, MD       Or  . acetaminophen (TYLENOL) solution 650 mg  650 mg Per Tube Q4H PRN Deveshwar, Sanjeev, MD       Or  . acetaminophen (TYLENOL) suppository 650 mg  650 mg Rectal Q4H PRN Deveshwar, Sanjeev, MD      . aspirin tablet 325 mg  325 mg Oral Daily Nancy Hawking, MD   325 mg at 03/30/19 1020   Or  . aspirin suppository 300 mg  300 mg Rectal Daily Nancy Hawking, MD      . atorvastatin (LIPITOR) tablet 20 mg  20 mg Oral [id number] Nancy Hawking, MD   20 mg at 03/29/19 1756  . bisacodyl (DULCOLAX) suppository 10 mg  10 mg Rectal Daily PRN Arnell Asal, NP      .  celecoxib (CELEBREX) capsule 100 mg  100 mg Oral Daily Garvin Fila, MD   100 mg at 03/30/19 1020  . Chlorhexidine Gluconate Cloth 2 % PADS 6 each  6 each Topical Daily Kerney Elbe, MD   6 each at 03/30/19 0800  . clopidogrel (PLAVIX) tablet 75 mg  75 mg Oral Daily Nancy Hawking, MD   75 mg at 03/30/19 1020  . diltiazem (CARDIZEM CD) 24 hr capsule 180 mg  180 mg Oral Daily Nancy Hawking, MD   180 mg at 03/30/19 1020  . enoxaparin (LOVENOX) injection 40 mg  40 mg Subcutaneous Q24H Nancy Hawking, MD   40 mg at 03/30/19 1020  . hydrALAZINE (APRESOLINE) injection  2-10 mg  2-10 mg Intravenous Q4H PRN Jennelle Human B, NP   10 mg at 03/27/19 0024  . insulin aspart (novoLOG) injection 0-9 Units  0-9 Units Subcutaneous Q4H Minor, Grace Bushy, NP   1 Units at 03/30/19 1019  . levothyroxine (SYNTHROID) tablet 112 mcg  112 mcg Oral Daily Nancy Hawking, MD   112 mcg at 03/30/19 0704  . Resource ThickenUp Clear   Oral PRN Nancy Hawking, MD      . senna-docusate (Senokot-S) tablet 1 tablet  1 tablet Oral QHS PRN Kerney Elbe, MD         Discharge Medications: Please see discharge summary for a list of discharge medications.  Relevant Imaging Results:  Relevant Lab Results:   Additional Information SSN: [id number]  Nancy Zamora, LCSWA

## 2019-03-30 NOTE — TOC Initial Note (Addendum)
Transition of Care [facility]) - Initial/Assessment Note    Patient Details  Name: Nancy Zamora MRN: [id number] Date of Birth: [birth year]  [facility]) CM/SW Contact:    Gelene Mink, [place] Phone Number: 03/30/2019, 4:07 PM  Clinical Narrative:                  CSW met with the patient at bedside. CSW introduced herself and explained her role. CSW shared therapy recommendation. The patient was slow to speak but she stated she has been to rehab at [facility]. She expressed interest in returning and completing rehab. She gave the CSW permission to contact her son on her behalf.   CSW called and spoke with the patient's son, Arcadia Gorgas. CSW introduced herself and explained her role. CSW shared why she was calling. Vevelyn Royals is agreeable to rehab at [facility]. That is his first choice. CSW asked if he had any other options, he stated that he would have to look them up and then he would contact the CSW back.   CSW obtained permission to fax the patient out to [facility].   CSW contacted [employer] and started insurance authorization.   CSW will continue to follow and assist with disposition planning.   Expected Discharge Plan: Skilled Nursing Facility Barriers to Discharge: Insurance Authorization, Continued Medical Work up   Patient Goals and CMS Choice Patient states their goals for this hospitalization and ongoing recovery are:: Pt and son are agreeable to rehab CMS Medicare.gov Compare Post Acute Care list provided to:: Patient Represenative (must comment) Choice offered to / list presented to : Adult Children  Expected Discharge Plan and Services Expected Discharge Plan: [place] In-house Referral: Clinical Social Work Discharge Planning Services: NA Post Acute Care Choice: [place] Living arrangements for the past 2 months: Single Family Home                 DME Arranged: N/A DME Agency: NA       HH Arranged: NA [place] Agency:  NA        Prior Living Arrangements/Services Living arrangements for the past 2 months: [place] Lives with:: Self Patient language and need for interpreter reviewed:: No Do you feel safe going back to the place where you live?: Yes      Need for Family Participation in Patient Care: Yes (Comment) Care giver support system in place?: Yes (comment)   Criminal Activity/Legal Involvement Pertinent to Current Situation/Hospitalization: No - Comment as needed  Activities of Daily Living      Permission Sought/Granted Permission sought to share information with : Case Manager Permission granted to share information with : Yes, Verbal Permission Granted  Share Information with NAME: Vevelyn Royals  Permission granted to share info w AGENCY: All SNF  Permission granted to share info w Relationship: Son     Emotional Assessment Appearance:: Appears stated age Attitude/Demeanor/Rapport: Engaged Affect (typically observed): Calm Orientation: : Oriented to Self, Oriented to Place, Oriented to  Time, Oriented to Situation Alcohol / Substance Use: Not Applicable Psych Involvement: No (comment)  Admission diagnosis:  Stroke (cerebrum) ([place]) [I63.9] Acute ischemic right MCA stroke ([place]) [A21.308] Acute embolic stroke [facility]) [M57.8] Patient Active Problem List   Diagnosis Date Noted  . Malnutrition of moderate degree 03/29/2019  . Airway intubation performed without difficulty   . Acute embolic stroke ([place]) 46/96/2952  . Middle cerebral artery embolism, right 03/26/2019   PCP:  System, Pcp Not In Pharmacy:   [facility]  [place], [place] - [address] [place] [address] Phone: [phone] Fax: [phone]     Social Determinants of Health (SDOH) Interventions    Readmission Risk Interventions No flowsheet data found.

## 2019-03-30 NOTE — Progress Notes (Signed)
STROKE TEAM PROGRESS NOTE   INTERVAL HISTORY Pt sitting in bed, her son is  at bedside. Persistent mild weakness left UE.  She is still complaining of shoulder and elbow pain and the son states that the patient had fallen at home and was wondering if x-rays were done to rule out fracture.  She was started on Celebrex yesterday Vitals:   03/29/19 2247 03/29/19 2329 03/30/19 0337 03/30/19 1131  BP:  124/86 134/74 (!) 153/66  Pulse: (!) 102 (!) 105 71 75  Resp: 20 20 15 17   Temp:  98.7 F (37.1 C) 98.1 F (36.7 C) 98.4 F (36.9 C)  TempSrc:  Oral Oral Oral  SpO2: 95% 96% 97% 98%  Weight:      Height:        CBC:  Recent Labs  Lab 03/26/19 1858  03/27/19 0235  03/29/19 0340 03/30/19 0410  WBC 16.2*  --  16.1*   < > 14.6* 12.4*  NEUTROABS 13.9*  --  12.9*  --   --   --   HGB 12.5   < > 13.5   < > 11.0* 11.5*  HCT 37.4   < > 40.7   < > 33.4* 34.4*  MCV 93.5  --  92.1   < > 93.8 92.5  PLT 190  --  202   < > 153 153   < > = values in this interval not displayed.    Basic Metabolic Panel:  Recent Labs  Lab 03/28/19 0154 03/29/19 0340 03/30/19 0410  NA 137 136 135  K 4.0 3.7 3.8  CL 106 105 106  CO2 23 21* 21*  GLUCOSE 102* 89 95  BUN 14 14 13   CREATININE 0.81 0.76 0.58  CALCIUM 8.2* 8.0* 7.9*  MG 1.9 2.1  --   PHOS 3.1 2.1*  --    Lipid Panel:     Component Value Date/Time   CHOL 228 (H) 03/27/2019 0235   TRIG 56 03/28/2019 0154   HDL 133 03/27/2019 0235   CHOLHDL 1.7 03/27/2019 0235   VLDL 15 03/27/2019 0235   LDLCALC 80 03/27/2019 0235   HgbA1c:  Lab Results  Component Value Date   HGBA1C 5.5 03/27/2019   Urine Drug Screen:     Component Value Date/Time   LABOPIA NONE DETECTED 03/26/2019 2030   COCAINSCRNUR NONE DETECTED 03/26/2019 2030   LABBENZ NONE DETECTED 03/26/2019 2030   AMPHETMU NONE DETECTED 03/26/2019 2030   THCU NONE DETECTED 03/26/2019 2030   LABBARB NONE DETECTED 03/26/2019 2030    Alcohol Level     Component Value Date/Time   [facility]  <10 03/26/2019 1858    IMAGING  Dg Chest Port 1 View 03/29/2019 IMPRESSION:  Minimal hazy left base opacification which may be due to small effusion/atelectasis.  Dg Chest Port 1 View 03/28/2019 IMPRESSION:  Status post extubation. Cardiomegaly without acute disease. Right basilar atelectasis noted.  Dg Swallowing Func-speech Pathology 03/28/2019 Objective Swallowing Evaluation: Type of Study: MBS-Modified Barium Swallow Study  Patient Details Name: Nancy Zamora MRN: [id number] Date of Birth: Dec 29, 1930 Today's Date: 03/28/2019 Time: SLP Start Time (ACUTE ONLY): [id number] -SLP Stop Time (ACUTE ONLY): 1323 SLP Time Calculation (min) (ACUTE ONLY): 20 min Past Medical History: No past medical history on file. Past Surgical History: Past Surgical History: Procedure Laterality Date . RADIOLOGY WITH ANESTHESIA Left 03/26/2019  Procedure: RADIOLOGY WITH ANESTHESIA;  Surgeon: Luanne Bras, MD;  Location: [place];  Service: Radiology;  Laterality: Left; HPI: Pt is an 85 y.o.  female who lives independently and presented to teh ED secondary to fall from a standing position, slurred speech, left sided facial droop, right gaze preference and left arm weakness. MRI of the brain showed multifocal ischemic infarction of the right MCA territory without acute hemorrhage or mass effect. Intubated 9/9-9/10.  No data recorded Assessment / Plan / Recommendation CHL IP CLINICAL IMPRESSIONS 03/28/2019 Clinical Impression Mild-moderate oropharyngeal dysphagia with decreased oral cohesion, laryngeal penetration and residual. Control and propulsion of boluses was challenging for pt and she used head extention to assist in bolus transfer to oropharynx. Anterior leakage on left side and lingual residue spilling into pharynx, laryngeal vestibule reaching vocal cords without adequate sensation to expel. Nectar thick consistently penetrated due to inadequate timing and incomplete epiglottic inversion. Cues for cough were ineffective to  remove. Vallecular residue ranged mild to max and mild in pyriform sinuses. Of note pt was sleepy this afternoon in comparison to bedside assessment. Dys 1 (puree), honey thick recommended, with assist, two swallows and pills whole in puree. Son educated in room after study and voiced understanding of recommendations.       SLP Visit Diagnosis Dysphagia, oropharyngeal phase (R13.12) Attention and concentration deficit following -- Frontal lobe and executive function deficit following -- Impact on safety and function Moderate aspiration risk   CHL IP TREATMENT RECOMMENDATION 03/28/2019 Treatment Recommendations Therapy as outlined in treatment plan below   Prognosis 03/28/2019 Prognosis for Safe Diet Advancement Good Barriers to Reach Goals -- Barriers/Prognosis Comment -- CHL IP DIET RECOMMENDATION 03/28/2019 SLP  Diet Recommendations Dysphagia 1 (Puree) solids;Honey thick liquids Liquid Administration via Cup Medication Administration Whole meds with puree Compensations Slow rate;Small sips/bites;Multiple dry swallows after each bite/sip;Lingual sweep for clearance of pocketing Postural Changes Seated upright at 90 degrees   CHL IP OTHER RECOMMENDATIONS 03/28/2019 Recommended Consults -- Oral Care Recommendations Oral care BID Other Recommendations --   CHL IP FOLLOW UP RECOMMENDATIONS 03/28/2019 Follow up Recommendations 24 hour supervision/assistance   CHL IP FREQUENCY AND DURATION 03/28/2019 Speech Therapy Frequency (ACUTE ONLY) min 2x/week Treatment Duration 2 weeks      CHL IP ORAL PHASE 03/28/2019 Oral Phase Impaired Oral - Pudding Teaspoon -- Oral - Pudding Cup -- Oral - Honey Teaspoon Left anterior bolus loss;Weak lingual manipulation;Reduced posterior propulsion;Delayed oral transit Oral - Honey Cup Left anterior bolus loss;Weak lingual manipulation;Reduced posterior propulsion;Delayed oral transit Oral - Nectar Teaspoon -- Oral - Nectar Cup Lingual/palatal residue;Weak lingual manipulation;Left anterior bolus  loss;Reduced posterior propulsion;Decreased bolus cohesion;Delayed oral transit Oral - Nectar Straw -- Oral - Thin Teaspoon -- Oral - Thin Cup -- Oral - Thin Straw -- Oral - Puree Decreased bolus cohesion;Delayed oral transit;Reduced posterior propulsion Oral - Mech Soft -- Oral - Regular -- Oral - Multi-Consistency -- Oral - Pill -- Oral Phase - Comment --  CHL IP PHARYNGEAL PHASE 03/28/2019 Pharyngeal Phase Impaired Pharyngeal- Pudding Teaspoon -- Pharyngeal -- Pharyngeal- Pudding Cup -- Pharyngeal -- Pharyngeal- Honey Teaspoon Pharyngeal residue - valleculae;Pharyngeal residue - pyriform Pharyngeal -- Pharyngeal- Honey Cup Penetration/Aspiration during swallow;Pharyngeal residue - valleculae;Pharyngeal residue - pyriform Pharyngeal Material enters airway, remains ABOVE vocal cords and not ejected out Pharyngeal- Nectar Teaspoon -- Pharyngeal -- Pharyngeal- Nectar Cup Pharyngeal residue - pyriform;Pharyngeal residue - valleculae;Penetration/Aspiration before swallow Pharyngeal Material enters airway, remains ABOVE vocal cords then ejected out Pharyngeal- Nectar Straw -- Pharyngeal -- Pharyngeal- Thin Teaspoon -- Pharyngeal -- Pharyngeal- Thin Cup -- Pharyngeal -- Pharyngeal- Thin Straw -- Pharyngeal -- Pharyngeal- Puree Pharyngeal residue - valleculae;Delayed swallow initiation-pyriform sinuses  Pharyngeal -- Pharyngeal- Mechanical Soft -- Pharyngeal -- Pharyngeal- Regular -- Pharyngeal -- Pharyngeal- Multi-consistency -- Pharyngeal -- Pharyngeal- Pill -- Pharyngeal -- Pharyngeal Comment --  CHL IP CERVICAL ESOPHAGEAL PHASE 03/28/2019 Cervical Esophageal Phase WFL Pudding Teaspoon -- Pudding Cup -- Honey Teaspoon -- Honey Cup -- Nectar Teaspoon -- Nectar Cup -- Nectar Straw -- Thin Teaspoon -- Thin Cup -- Thin Straw -- Puree -- Mechanical Soft -- Regular -- Multi-consistency -- Pill -- Cervical Esophageal Comment -- Nancy Zamora 03/28/2019, 3:11 PM Nancy Zamora M.Ed Actor  Pager [phone] Office [phone]              Cerebral Angiogram 03/27/2019 Occlusion of RT MCA superior division ant peri sylvian branch M2/M3 junction.   Left Shoulder X-ray - pending  Left Elbow X-ray - pending   PHYSICAL EXAM    Temp:  [98 F (36.7 C)-98.7 F (37.1 C)] 98.4 F (36.9 C) (09/13 1131) Pulse Rate:  [71-105] 75 (09/13 1131) Resp:  [15-24] 17 (09/13 1131) BP: (124-153)/(66-86) 153/66 (09/13 1131) SpO2:  [95 %-98 %] 98 % (09/13 1131)  General - Well nourished, well developed elderly Caucasian lady, no apparent distress.  Ophthalmologic - fundi not visualized due to noncooperation.  Cardiovascular - Regular rate and rhythm.  Neuro -AAO x3, following all simple commands, no aphasia, mild dysarthria, able to name and repeat. With eye opening, eyes in slight right gaze position, left gaze incomplete, visual field full but has left simutagnosia, PERRL.  Left facial droop.  Tongue protrusion midline. RUE 4+/5 and RLE 4+/5. LUE 3/5 and LLE 4/5. DTR 1+ and no babinski. Sensation symmetrical, coordination R FTN intact and gait not tested.  ASSESSMENT/PLAN Nancy Zamora is a 85 y.o. female with history of HTN, CKD, AV stenosis, PSVT/PVCs, hypothyroidism and peripheral neuropathy presenting following a fall with slurred speech, left sided facial droop, right gaze preference and left arm weakness.   Stroke:  R MCA infarct due to right distal M2 occlusion, embolic secondary to PAF on tele in ICU  Code Stroke CT head No acute abnormality. ASPECTS 8    CTA head & neck occlusion mid-distal R M2 branch. No significant stenosis  CT perfusion 16mL R MCA territory infarct R frontal operculum w. 37mL penumbra  Cerebral angio occlusion R MCA M2/M3 jxn  CT CSpine no fx, subluxation  MRI  Multifocal R MCA territory infarct w/o hemorrhage or mass effect  2D Echo EF >65%. No source of embolus   LE venous doppler no DVT  LDL 80  HgbA1c 5.5  lovenox subq for VTE prophylaxis.    No antithrombotic prior to admission, now on ASA 325mg  and plavix 75. Will consider DOAC in 3-5 days.   Therapy recommendations:  SNF recommended  Disposition:  pending   PAF with RVR Hx of SVT  Reported on cardizem in the past  PAF with RVR on tele in ICU  Resume cardizam after passing swallow -> Cardizem CD 180 mg daily started 03/28/19  Now on ASA and plavix  Consider DOAC in 3-5 days  Hypertension  Home meds:  No listed home antihypertensives  Treated w/ Cleviprex post angio  BP now Stable . Resume cardizem home meds . Long-term BP goal normotensive  Hyperlipidemia  Home meds:  No statin  LDL 80, goal < 70  Add lipitor 20   Continue statin at discharge  Dysphagia . Secondary to stroke . On puree diet now . Speech on board . Resume home meds . MBS  with speech - see above   Other Stroke Risk Factors  Advanced age  Family hx stroke   Non-rheumatic AV stenosis   Other Active Problems  Fall - CK ok  UA neg, UCx - 03/27/19 - no growth (final)  Trop 22  Hypothyroid, TSH ok  Osteoporosis on denosumab as OP. C-Spine cleared.   Leukocytosis 16.1->15.8->14.6 (afebrile)->12.4  Chest x-ray - 03/29/2019 - Minimal hazy left base opacification which may be due to small effusion/atelectasis.  Left Shoulder X-ray - pending  Left Elbow X-ray - pending  Hospital day # 4  I had a long discussion with the patient and his son regarding i her left shoulder and elbow  discomfort due to arthritic pain and will check x-rays and continue Celebrex.  Continue aspirin for now and change over to Eliquis tomorrow, greater than 50% time during this 25-minute visit was spent on counseling and coordination of care about her embolic stroke and risk-benefit of Celebrex after an acute stroke and answering questions Antony Contras, MD Medical Director [place] Pager: [phone] 03/30/2019 3:07 PM To contact Stroke Continuity provider, please refer to  [email or web address] After hours, contact General Neurology

## 2019-03-31 ENCOUNTER — Inpatient Hospital Stay (HOSPITAL_COMMUNITY): Payer: PPO

## 2019-03-31 ENCOUNTER — Encounter (HOSPITAL_COMMUNITY): Payer: Self-pay | Admitting: Interventional Radiology

## 2019-03-31 DIAGNOSIS — E039 Hypothyroidism, unspecified: Secondary | ICD-10-CM | POA: Diagnosis present

## 2019-03-31 DIAGNOSIS — I69391 Dysphagia following cerebral infarction: Secondary | ICD-10-CM

## 2019-03-31 DIAGNOSIS — W19XXXA Unspecified fall, initial encounter: Secondary | ICD-10-CM | POA: Diagnosis present

## 2019-03-31 DIAGNOSIS — M810 Age-related osteoporosis without current pathological fracture: Secondary | ICD-10-CM | POA: Diagnosis present

## 2019-03-31 DIAGNOSIS — I4891 Unspecified atrial fibrillation: Secondary | ICD-10-CM | POA: Diagnosis present

## 2019-03-31 DIAGNOSIS — I10 Essential (primary) hypertension: Secondary | ICD-10-CM | POA: Diagnosis present

## 2019-03-31 DIAGNOSIS — E785 Hyperlipidemia, unspecified: Secondary | ICD-10-CM | POA: Diagnosis present

## 2019-03-31 DIAGNOSIS — I471 Supraventricular tachycardia: Secondary | ICD-10-CM | POA: Diagnosis present

## 2019-03-31 LAB — CBC WITH DIFFERENTIAL/PLATELET
Abs Immature Granulocytes: 0.1 10*3/uL — ABNORMAL HIGH (ref 0.00–0.07)
Basophils Absolute: 0.1 10*3/uL (ref 0.0–0.1)
Basophils Relative: 0 %
Eosinophils Absolute: 0.1 10*3/uL (ref 0.0–0.5)
Eosinophils Relative: 1 %
HCT: 35.9 % — ABNORMAL LOW (ref 36.0–46.0)
Hemoglobin: 12.7 g/dL (ref 12.0–15.0)
Immature Granulocytes: 1 %
Lymphocytes Relative: 14 %
Lymphs Abs: 2 10*3/uL (ref 0.7–4.0)
MCH: 31.9 pg (ref 26.0–34.0)
MCHC: 35.4 g/dL (ref 30.0–36.0)
MCV: 90.2 fL (ref 80.0–100.0)
Monocytes Absolute: 1.7 10*3/uL — ABNORMAL HIGH (ref 0.1–1.0)
Monocytes Relative: 12 %
Neutro Abs: 9.8 10*3/uL — ABNORMAL HIGH (ref 1.7–7.7)
Neutrophils Relative %: 72 %
Platelets: 209 10*3/uL (ref 150–400)
RBC: 3.98 MIL/uL (ref 3.87–5.11)
RDW: 13 % (ref 11.5–15.5)
WBC: 13.6 10*3/uL — ABNORMAL HIGH (ref 4.0–10.5)
nRBC: 0 % (ref 0.0–0.2)

## 2019-03-31 LAB — CBC
HCT: 37.9 % (ref 36.0–46.0)
Hemoglobin: 12.6 g/dL (ref 12.0–15.0)
MCH: 30.4 pg (ref 26.0–34.0)
MCHC: 33.2 g/dL (ref 30.0–36.0)
MCV: 91.3 fL (ref 80.0–100.0)
Platelets: 211 10*3/uL (ref 150–400)
RBC: 4.15 MIL/uL (ref 3.87–5.11)
RDW: 13.1 % (ref 11.5–15.5)
WBC: 11.5 10*3/uL — ABNORMAL HIGH (ref 4.0–10.5)
nRBC: 0 % (ref 0.0–0.2)

## 2019-03-31 LAB — GLUCOSE, CAPILLARY
Glucose-Capillary: 141 mg/dL — ABNORMAL HIGH (ref 70–99)
Glucose-Capillary: 144 mg/dL — ABNORMAL HIGH (ref 70–99)
Glucose-Capillary: 146 mg/dL — ABNORMAL HIGH (ref 70–99)
Glucose-Capillary: 162 mg/dL — ABNORMAL HIGH (ref 70–99)
Glucose-Capillary: 91 mg/dL (ref 70–99)
Glucose-Capillary: 93 mg/dL (ref 70–99)
Glucose-Capillary: 95 mg/dL (ref 70–99)
Glucose-Capillary: 99 mg/dL (ref 70–99)

## 2019-03-31 LAB — BASIC METABOLIC PANEL
Anion gap: 9 (ref 5–15)
Anion gap: 9 (ref 5–15)
BUN: 10 mg/dL (ref 8–23)
BUN: 14 mg/dL (ref 8–23)
CO2: 24 mmol/L (ref 22–32)
CO2: 22 mmol/L (ref 22–32)
Calcium: 8.1 mg/dL — ABNORMAL LOW (ref 8.9–10.3)
Calcium: 7.8 mg/dL — ABNORMAL LOW (ref 8.9–10.3)
Chloride: 101 mmol/L (ref 98–111)
Chloride: 102 mmol/L (ref 98–111)
Creatinine, Ser: 0.6 mg/dL (ref 0.44–1.00)
Creatinine, Ser: 0.87 mg/dL (ref 0.44–1.00)
GFR calc Af Amer: >60 mL/min (ref >60)
GFR calc Af Amer: >60 mL/min (ref >60)
GFR calc non Af Amer: >60 mL/min (ref >60)
GFR calc non Af Amer: >60 mL/min (ref >60)
Glucose, Bld: 104 mg/dL — ABNORMAL HIGH (ref 70–99)
Glucose, Bld: 169 mg/dL — ABNORMAL HIGH (ref 70–99)
Potassium: 3.4 mmol/L — ABNORMAL LOW (ref 3.5–5.1)
Potassium: 3.2 mmol/L — ABNORMAL LOW (ref 3.5–5.1)
Sodium: 134 mmol/L — ABNORMAL LOW (ref 135–145)
Sodium: 133 mmol/L — ABNORMAL LOW (ref 135–145)

## 2019-03-31 LAB — CK TOTAL AND CKMB (NOT AT ARMC)
CK, MB: 2 ng/mL (ref 0.5–5.0)
Relative Index: INVALID (ref 0.0–2.5)
Total CK: 49 U/L (ref 38–234)

## 2019-03-31 LAB — SARS CORONAVIRUS 2 BY RT PCR (HOSPITAL ORDER, PERFORMED IN ~~LOC~~ HOSPITAL LAB): SARS Coronavirus 2: NEGATIVE

## 2019-03-31 LAB — VITAMIN B12: Vitamin B-12: 4339 pg/mL — ABNORMAL HIGH (ref 180–914)

## 2019-03-31 MED ORDER — POLYETHYLENE GLYCOL 3350 17 G PO PACK
17.0000 g | PACK | Freq: Once | ORAL | Status: AC
  Administered 2019-03-31: 17 g via ORAL
  Filled 2019-03-31: qty 1

## 2019-03-31 MED ORDER — APIXABAN 2.5 MG PO TABS: 2.5000 mg | ORAL_TABLET | Freq: Two times a day (BID) | ORAL | Status: DC

## 2019-03-31 MED ORDER — ATORVASTATIN CALCIUM 20 MG PO TABS: 20.0000 mg | ORAL_TABLET | Freq: Every day | ORAL | Status: AC

## 2019-03-31 MED ORDER — DILTIAZEM HCL ER COATED BEADS 240 MG PO CP24: 240.0000 mg | ORAL_CAPSULE | Freq: Every day | ORAL | Status: DC

## 2019-03-31 MED ORDER — SODIUM CHLORIDE 0.9 % IV SOLN
1.0000 g | INTRAVENOUS | Status: DC
  Administered 2019-03-31 – 2019-04-01 (×2): 1 g via INTRAVENOUS
  Filled 2019-03-31 (×2): qty 10

## 2019-03-31 MED ORDER — RESOURCE THICKENUP CLEAR PO POWD: 1.0000 | ORAL | Status: DC | PRN

## 2019-03-31 MED ORDER — DILTIAZEM HCL ER COATED BEADS 180 MG PO CP24
180.0000 mg | ORAL_CAPSULE | Freq: Every day | ORAL | Status: DC
  Administered 2019-04-01 – 2019-04-02 (×2): 180 mg via ORAL
  Filled 2019-03-31 (×2): qty 1

## 2019-03-31 MED ORDER — CELECOXIB 100 MG PO CAPS: 100.0000 mg | ORAL_CAPSULE | Freq: Every day | ORAL | Status: DC

## 2019-03-31 MED ORDER — APIXABAN 2.5 MG PO TABS
2.5000 mg | ORAL_TABLET | Freq: Two times a day (BID) | ORAL | Status: DC
  Administered 2019-03-31 – 2019-04-02 (×4): 2.5 mg via ORAL
  Filled 2019-03-31 (×4): qty 1

## 2019-03-31 MED ORDER — LEVOTHYROXINE SODIUM 112 MCG PO TABS: 112.0000 ug | ORAL_TABLET | Freq: Every day | ORAL | Status: AC

## 2019-03-31 MED ORDER — SODIUM CHLORIDE 0.9 % IV SOLN
Freq: Once | INTRAVENOUS | Status: AC
  Administered 2019-03-31: 16:00:00 via INTRAVENOUS

## 2019-03-31 NOTE — Progress Notes (Addendum)
Pharmacy - > Eliquis  PAF Age > 80, Weight < 60 kg   Plan: Eliquis 2.5 mg po BID Stopped aspirin, plavix, and Lovenox   Thank you Anette Guarneri, PharmD

## 2019-03-31 NOTE — Discharge Instructions (Signed)

## 2019-03-31 NOTE — Progress Notes (Signed)
Physical Therapy Treatment Patient Details Name: Nancy Zamora MRN: [id number] DOB: 1930/08/08 Today's Date: 03/31/2019    History of Present Illness Pt is an 85 y.o. female who presents to the ED secondary to fall from a standing position; noted to have slurred speech, left sided facial droop, right gaze preference and left arm weakness. MRI of the brain-multifocal ischemic infarction of the right MCA territory without acute hemorrhage or mass effect. PMH includes HTN, CKD, AV stenosis, PSVT/PVCs, hypothyroidism, peripheral neuropathy, and fx of LUE and LLE.    PT Comments    Patient received in bed, son present. Patient lethargic initially, but able to rouse fairly easily. Patient performed spine bed exercises with assist on left and then assisted to recliner requiring mod assist for bed mobility, transfers and to take 2-3 steps to recliner. Patient eager/motivated to improve. She will benefit from continued acute PT to improve functional independence and strength.       Follow Up Recommendations  SNF;Supervision for mobility/OOB     Equipment Recommendations  Other (comment)(TBD)    Recommendations for Other Services       Precautions / Restrictions Precautions Precautions: Fall Restrictions Weight Bearing Restrictions: No    Mobility  Bed Mobility Overal bed mobility: Needs Assistance Bed Mobility: Supine to Sit     Supine to sit: Mod assist        Transfers Overall transfer level: Needs assistance   Transfers: Sit to/from Stand;Stand Pivot Transfers Sit to Stand: Mod assist Stand pivot transfers: Mod assist          Ambulation/Gait Ambulation/Gait assistance: Mod assist Gait Distance (Feet): 2 Feet Assistive device: None   Gait velocity: Decreased   General Gait Details: cues needed for sequencing, able to take a coouple of steps from bed to recliner.   Stairs             Wheelchair Mobility    Modified Rankin (Stroke Patients Only)        Balance Overall balance assessment: Needs assistance Sitting-balance support: Feet supported;No upper extremity supported Sitting balance-Leahy Scale: Poor Sitting balance - Comments: posterior leaning this day. Cues to come forward, but patient unable. Requires very close supervision to min assist at all times when seated at edge of bed. Postural control: Posterior lean Standing balance support: Bilateral upper extremity supported;During functional activity Standing balance-Leahy Scale: Poor Standing balance comment: Requires external support in standing; Mod A for static standing balance                            Cognition Arousal/Alertness: Awake/alert Behavior During Therapy: Flat affect;WFL for tasks assessed/performed Overall Cognitive Status: Impaired/Different from baseline Area of Impairment: Orientation;Awareness;Attention;Following commands;Safety/judgement                 Orientation Level: Disoriented to;Situation     Following Commands: Follows one step commands with increased time Safety/Judgement: Decreased awareness of safety     General Comments:  Right gaze preference. Left inattention. Able to gaze midline and to the left but not sustain. Following single step cues consistently       Exercises Other Exercises Other Exercises: supine B LE exercises: AP, heel slides with assist on left, SLR with assist on left. hip abd/add, with assist on left. x 10 reps each.    General Comments        Pertinent Vitals/Pain Pain Assessment: No/denies pain    Home Living  Prior Function            PT Goals (current goals can now be found in the care plan section) Acute Rehab PT Goals Patient Stated Goal: to be able to take care of myself, I am very independent PT Goal Formulation: With patient/family Time For Goal Achievement: 04/11/19 Potential to Achieve Goals: Fair Progress towards PT goals: Progressing  toward goals    Frequency           PT Plan Current plan remains appropriate    Co-evaluation              AM-PAC PT "6 Clicks" Mobility   Outcome Measure  Help needed turning from your back to your side while in a flat bed without using bedrails?: A Little Help needed moving from lying on your back to sitting on the side of a flat bed without using bedrails?: A Lot Help needed moving to and from a bed to a chair (including a wheelchair)?: A Lot Help needed standing up from a chair using your arms (e.g., wheelchair or bedside chair)?: A Lot Help needed to walk in hospital room?: A Lot Help needed climbing 3-5 steps with a railing? : Total 6 Click Score: 12    End of Session Equipment Utilized During Treatment: Gait belt Activity Tolerance: Patient tolerated treatment well Patient left: in chair;with family/visitor present Nurse Communication: Mobility status PT Visit Diagnosis: Unsteadiness on feet (R26.81);Muscle weakness (generalized) (M62.81);Difficulty in walking, not elsewhere classified (R26.2);Hemiplegia and hemiparesis Hemiplegia - Right/Left: Left Hemiplegia - dominant/non-dominant: Non-dominant Hemiplegia - caused by: Cerebral infarction     Time: [id number] PT Time Calculation (min) (ACUTE ONLY): 28 min  Charges:  $Therapeutic Exercise: 8-22 mins $Therapeutic Activity: 8-22 mins                     [employer], PT, [employer] 03/31/19,2:01 PM

## 2019-03-31 NOTE — Care Management Important Message (Signed)
Important Message  Patient Details  Name: Nancy Zamora MRN: [id number] Date of Birth: Apr 18, 1931   Medicare Important Message Given:     Due to illness Patient could not sign.  Unsigned copy left at bedside   Orbie Pyo 03/31/2019, 3:55 PM

## 2019-03-31 NOTE — Progress Notes (Signed)
STROKE TEAM PROGRESS NOTE   INTERVAL HISTORY Pt sitting in bed, her son is  at bedside. Persistent mild weakness left UE.  She is sleepy during rounds earlier morning but could be aroused.  We received approval from social worker about transfer to nursing home in the afternoon however I got a phone call from the nurse saying that patient's blood pressure is 84/60.  Patient had been sitting in a bedside chair and had eaten her meal an hour ago.  Patient was laid back in the bed and started feeling better with blood pressure in the low [id number] systolic.  She was given 500 cc of normal saline and her condition improved but blood pressure stayed in 110/70 range.  Lab work was sent for CBC, cardiac enzymes, BMP and EKG was ordered. Vitals:   03/31/19 1615 03/31/19 1630 03/31/19 1638 03/31/19 1645  BP: 111/70 111/67 110/60 110/65  Pulse:   86   Resp:      Temp:   97.6 F (36.4 C)   TempSrc:      SpO2:   91%   Weight:      Height:        CBC:  Recent Labs  Lab 03/27/19 0235  03/31/19 0717 03/31/19 1633  WBC 16.1*   < > 11.5* 13.6*  NEUTROABS 12.9*  --   --  9.8*  HGB 13.5   < > 12.6 12.7  HCT 40.7   < > 37.9 35.9*  MCV 92.1   < > 91.3 90.2  PLT 202   < > 211 209   < > = values in this interval not displayed.    Basic Metabolic Panel:  Recent Labs  Lab 03/28/19 0154 03/29/19 0340  03/31/19 0717 03/31/19 1633  NA 137 136   < > 134* 133*  K 4.0 3.7   < > 3.4* 3.2*  CL 106 105   < > 101 102  CO2 23 21*   < > 24 22  GLUCOSE 102* 89   < > 104* 169*  BUN 14 14   < > 10 14  CREATININE 0.81 0.76   < > 0.60 0.87  CALCIUM 8.2* 8.0*   < > 8.1* 7.8*  MG 1.9 2.1  --   --   --   PHOS 3.1 2.1*  --   --   --    < > = values in this interval not displayed.   Lipid Panel:     Component Value Date/Time   CHOL 228 (H) 03/27/2019 0235   TRIG 56 03/28/2019 0154   HDL 133 03/27/2019 0235   CHOLHDL 1.7 03/27/2019 0235   VLDL 15 03/27/2019 0235   LDLCALC 80 03/27/2019 0235   HgbA1c:  Lab  Results  Component Value Date   HGBA1C 5.5 03/27/2019   Urine Drug Screen:     Component Value Date/Time   LABOPIA NONE DETECTED 03/26/2019 2030   COCAINSCRNUR NONE DETECTED 03/26/2019 2030   LABBENZ NONE DETECTED 03/26/2019 2030   AMPHETMU NONE DETECTED 03/26/2019 2030   THCU NONE DETECTED 03/26/2019 2030   LABBARB NONE DETECTED 03/26/2019 2030    Alcohol Level     Component Value Date/Time   [facility] <10 03/26/2019 1858    IMAGING  Dg Chest Port 1 View 03/29/2019 IMPRESSION:  Minimal hazy left base opacification which may be due to small effusion/atelectasis.  Dg Chest Port 1 View 03/28/2019 IMPRESSION:  Status post extubation. Cardiomegaly without acute disease. Right basilar atelectasis noted.  Dg Swallowing Func-speech Pathology 03/28/2019 Objective Swallowing Evaluation: Type of Study: MBS-Modified Barium Swallow Study  Patient Details Name: Nancy Zamora MRN: [id number] Date of Birth: 1931-03-15 Today's Date: 03/28/2019 Time: SLP Start Time (ACUTE ONLY): [id number] -SLP Stop Time (ACUTE ONLY): 1323 SLP Time Calculation (min) (ACUTE ONLY): 20 min Past Medical History: No past medical history on file. Past Surgical History: Past Surgical History: Procedure Laterality Date . RADIOLOGY WITH ANESTHESIA Left 03/26/2019  Procedure: RADIOLOGY WITH ANESTHESIA;  Surgeon: Luanne Bras, MD;  Location: [place];  Service: Radiology;  Laterality: Left; HPI: Pt is an 85 y.o. female who lives independently and presented to teh ED secondary to fall from a standing position, slurred speech, left sided facial droop, right gaze preference and left arm weakness. MRI of the brain showed multifocal ischemic infarction of the right MCA territory without acute hemorrhage or mass effect. Intubated 9/9-9/10.  No data recorded Assessment / Plan / Recommendation CHL IP CLINICAL IMPRESSIONS 03/28/2019 Clinical Impression Mild-moderate oropharyngeal dysphagia with decreased oral cohesion, laryngeal penetration and  residual. Control and propulsion of boluses was challenging for pt and she used head extention to assist in bolus transfer to oropharynx. Anterior leakage on left side and lingual residue spilling into pharynx, laryngeal vestibule reaching vocal cords without adequate sensation to expel. Nectar thick consistently penetrated due to inadequate timing and incomplete epiglottic inversion. Cues for cough were ineffective to remove. Vallecular residue ranged mild to max and mild in pyriform sinuses. Of note pt was sleepy this afternoon in comparison to bedside assessment. Dys 1 (puree), honey thick recommended, with assist, two swallows and pills whole in puree. Son educated in room after study and voiced understanding of recommendations.       SLP Visit Diagnosis Dysphagia, oropharyngeal phase (R13.12) Attention and concentration deficit following -- Frontal lobe and executive function deficit following -- Impact on safety and function Moderate aspiration risk   CHL IP TREATMENT RECOMMENDATION 03/28/2019 Treatment Recommendations Therapy as outlined in treatment plan below   Prognosis 03/28/2019 Prognosis for Safe Diet Advancement Good Barriers to Reach Goals -- Barriers/Prognosis Comment -- CHL IP DIET RECOMMENDATION 03/28/2019 SLP  Diet Recommendations Dysphagia 1 (Puree) solids;Honey thick liquids Liquid Administration via Cup Medication Administration Whole meds with puree Compensations Slow rate;Small sips/bites;Multiple dry swallows after each bite/sip;Lingual sweep for clearance of pocketing Postural Changes Seated upright at 90 degrees   CHL IP OTHER RECOMMENDATIONS 03/28/2019 Recommended Consults -- Oral Care Recommendations Oral care BID Other Recommendations --   CHL IP FOLLOW UP RECOMMENDATIONS 03/28/2019 Follow up Recommendations 24 hour supervision/assistance   CHL IP FREQUENCY AND DURATION 03/28/2019 Speech Therapy Frequency (ACUTE ONLY) min 2x/week Treatment Duration 2 weeks      CHL IP ORAL PHASE 03/28/2019  Oral Phase Impaired Oral - Pudding Teaspoon -- Oral - Pudding Cup -- Oral - Honey Teaspoon Left anterior bolus loss;Weak lingual manipulation;Reduced posterior propulsion;Delayed oral transit Oral - Honey Cup Left anterior bolus loss;Weak lingual manipulation;Reduced posterior propulsion;Delayed oral transit Oral - Nectar Teaspoon -- Oral - Nectar Cup Lingual/palatal residue;Weak lingual manipulation;Left anterior bolus loss;Reduced posterior propulsion;Decreased bolus cohesion;Delayed oral transit Oral - Nectar Straw -- Oral - Thin Teaspoon -- Oral - Thin Cup -- Oral - Thin Straw -- Oral - Puree Decreased bolus cohesion;Delayed oral transit;Reduced posterior propulsion Oral - Mech Soft -- Oral - Regular -- Oral - Multi-Consistency -- Oral - Pill -- Oral Phase - Comment --  CHL IP PHARYNGEAL PHASE 03/28/2019 Pharyngeal Phase Impaired Pharyngeal- Pudding Teaspoon -- Pharyngeal -- Pharyngeal- Pudding Cup --  Pharyngeal -- Pharyngeal- Honey Teaspoon Pharyngeal residue - valleculae;Pharyngeal residue - pyriform Pharyngeal -- Pharyngeal- Honey Cup Penetration/Aspiration during swallow;Pharyngeal residue - valleculae;Pharyngeal residue - pyriform Pharyngeal Material enters airway, remains ABOVE vocal cords and not ejected out Pharyngeal- Nectar Teaspoon -- Pharyngeal -- Pharyngeal- Nectar Cup Pharyngeal residue - pyriform;Pharyngeal residue - valleculae;Penetration/Aspiration before swallow Pharyngeal Material enters airway, remains ABOVE vocal cords then ejected out Pharyngeal- Nectar Straw -- Pharyngeal -- Pharyngeal- Thin Teaspoon -- Pharyngeal -- Pharyngeal- Thin Cup -- Pharyngeal -- Pharyngeal- Thin Straw -- Pharyngeal -- Pharyngeal- Puree Pharyngeal residue - valleculae;Delayed swallow initiation-pyriform sinuses Pharyngeal -- Pharyngeal- Mechanical Soft -- Pharyngeal -- Pharyngeal- Regular -- Pharyngeal -- Pharyngeal- Multi-consistency -- Pharyngeal -- Pharyngeal- Pill -- Pharyngeal -- Pharyngeal Comment --  CHL IP  CERVICAL ESOPHAGEAL PHASE 03/28/2019 Cervical Esophageal Phase WFL Pudding Teaspoon -- Pudding Cup -- Honey Teaspoon -- Honey Cup -- Nectar Teaspoon -- Nectar Cup -- Nectar Straw -- Thin Teaspoon -- Thin Cup -- Thin Straw -- Puree -- Mechanical Soft -- Regular -- Multi-consistency -- Pill -- Cervical Esophageal Comment -- [place] Siren 03/28/2019, 3:11 PM Orbie Pyo Litaker M.Ed Actor Pager [phone] Office [phone]              Cerebral Angiogram 03/27/2019 Occlusion of RT MCA superior division ant peri sylvian branch M2/M3 junction.   Left Shoulder X-ray - pending  Left Elbow X-ray - pending   PHYSICAL EXAM    Temp:  [97.6 F (36.4 C)-99.6 F (37.6 C)] 97.6 F (36.4 C) (09/14 1638) Pulse Rate:  [78-106] 86 (09/14 1638) Resp:  [16-19] 17 (09/14 1100) BP: (59-173)/(36-95) 110/65 (09/14 1645) SpO2:  [91 %-100 %] 91 % (09/14 1638)  General - Well nourished, well developed elderly Caucasian lady, no apparent distress.  Ophthalmologic - fundi not visualized due to noncooperation.  Cardiovascular - Regular rate and rhythm.  Neuro -AAO x3, following all simple commands, no aphasia, mild dysarthria, able to name and repeat. With eye opening, eyes in slight right gaze position, left gaze incomplete, visual field full but has left simutagnosia, PERRL.  Left facial droop.  Tongue protrusion midline. RUE 4+/5 and RLE 4+/5. LUE 3/5 and LLE 4/5. DTR 1+ and no babinski. Sensation symmetrical, coordination R FTN intact and gait not tested.  ASSESSMENT/PLAN [name] is a 85 y.o. female with history of HTN, CKD, AV stenosis, PSVT/PVCs, hypothyroidism and peripheral neuropathy presenting following a fall with slurred speech, left sided facial droop, right gaze preference and left arm weakness.   Stroke:  R MCA infarct due to right distal M2 occlusion, embolic secondary to PAF on tele in ICU  Code Stroke CT head No acute abnormality. ASPECTS 8    CTA head  & neck occlusion mid-distal R M2 branch. No significant stenosis  CT perfusion 31mL R MCA territory infarct R frontal operculum w. 4mL penumbra  Cerebral angio occlusion R MCA M2/M3 jxn  CT CSpine no fx, subluxation  MRI  Multifocal R MCA territory infarct w/o hemorrhage or mass effect  2D Echo EF >65%. No source of embolus   LE venous doppler no DVT  LDL 80  HgbA1c 5.5  lovenox subq for VTE prophylaxis.   No antithrombotic prior to admission, now on ASA 325mg  and plavix 75. Will consider DOAC in 3-5 days.   Therapy recommendations:  SNF recommended  Disposition:  pending   PAF with RVR Hx of SVT  Reported on cardizem in the past  PAF with RVR on tele in ICU  Resume cardizam after passing swallow -> Cardizem CD 180 mg daily started 03/28/19  Now on ASA and plavix  Consider DOAC in 3-5 days  Hypertension  Home meds:  No listed home antihypertensives  Treated w/ Cleviprex post angio  BP now Stable . Resume cardizem home meds . Long-term BP goal normotensive  Hyperlipidemia  Home meds:  No statin  LDL 80, goal < 70  Add lipitor 20   Continue statin at discharge  Dysphagia . Secondary to stroke . On puree diet now . Speech on board . Resume home meds . MBS with speech - see above   Other Stroke Risk Factors  Advanced age  Family hx stroke   Non-rheumatic AV stenosis   Other Active Problems  Fall - CK ok  UA neg, UCx - 03/27/19 - no growth (final)  Trop 20  Hypothyroid, TSH ok  Osteoporosis on denosumab as OP. C-Spine cleared.   Leukocytosis 16.1->15.8->14.6 (afebrile)->12.4  Chest x-ray - 03/29/2019 - Minimal hazy left base opacification which may be due to small effusion/atelectasis.  Left Shoulder X-ray -no fracture Left Elbow X-ray -no fracture Hospital day # 5  I had a long discussion with the patient and her son regarding need to cancel her pending discharge today due to her episode of hypotension which will need evaluation.   I recommend bedrest today, IV normal saline bolus and medical hospitalist team consult to look for reversible medical issues which could be contributing to her hypotension.  If she remained stable overnight and is doing well we will consider transfer to nursing home in the morning.  Continue aspirin for now and change over to Eliquis tomorrow at time of discharge., greater than 50% time during this 35-minute visit was spent on counseling and coordination of care about her embolic stroke and r discussing work-up for hypotensive episode answering questions.discussed with Dr. Laren Everts medical hospitalist  Antony Contras, MD Medical Director [place] Pager: [phone] 03/31/2019 6:02 PM To contact Stroke Continuity provider, please refer to [email or web address] After hours, contact General Neurology

## 2019-03-31 NOTE — Care Management Important Message (Signed)
Important Message  Patient Details  Name: Nancy Zamora MRN: [id number] Date of Birth: 1931/07/14   Medicare Important Message Given:  Yes     Marianna Cid Montine Circle 03/31/2019, 3:55 PM

## 2019-03-31 NOTE — TOC Progression Note (Signed)
Transition of Care [facility]) - Progression Note    Patient Details  Name: Mayline Oganesyan MRN: [id number] Date of Birth: [birth year]  Transition of Care [facility]) CM/SW [place], [place] Phone Number: 03/31/2019, 4:05 PM  Clinical Narrative:  CSW following for discharge plan. Patient has a bed at [facility], and authorization was received [phone]). CSW began to facilitate discharge to [facility] and then notified by medical team of acute changes, discharge canceled for the day today. CSW alerted [name]. CSW to follow up tomorrow.     Expected Discharge Plan: Skilled Nursing Facility Barriers to Discharge: Ship broker, Continued Medical Work up  Expected Discharge Plan and Services Expected Discharge Plan: [place] In-house Referral: Clinical Social Work Discharge Planning Services: NA Post Acute Care Choice: [place] Living arrangements for the past 2 months: Single Family Home Expected Discharge Date: 03/31/19               DME Arranged: N/A DME Agency: NA       HH Arranged: NA HH Agency: NA         Social Determinants of Health (SDOH) Interventions    Readmission Risk Interventions No flowsheet data found.

## 2019-03-31 NOTE — Consult Note (Signed)
[facility]                                                                                    Patient Demographics  Nancy Zamora, is a 85 y.o. female  CSN: [id number]  MRN: [id number]  DOB - 1930/11/16  Admit Date - 03/26/2019  Outpatient Primary MD for the patient is System, Pcp Not In   With History of -  History reviewed. No pertinent past medical history.    Past Surgical History:  Procedure Laterality Date  . IR ANGIO INTRA EXTRACRAN SEL COM CAROTID INNOMINATE UNI R MOD SED  03/26/2019  . IR ANGIO VERTEBRAL SEL SUBCLAVIAN INNOMINATE UNI R MOD SED  03/26/2019  . RADIOLOGY WITH ANESTHESIA Left 03/26/2019   Procedure: RADIOLOGY WITH ANESTHESIA;  Surgeon: Luanne Bras, MD;  Location: [facility];  Service: Radiology;  Laterality: Left;    in for   Chief Complaint  Patient presents with  . Aphasia     HPI  Nancy Zamora  is a 85 y.o. female, admitted recently to our facility with MCA stroke.  Patient was ready for discharge today when she was noted to have hypotension and altered mental status.  Patient denies chest pains or shortness of breath but reports dysuria.  No burning in urination .  She also complains of constipation and wants her MiraLAX back.  Patient denies any headache or dizziness.  Patient has a peer wick catheter .    Review of Systems    In addition to the HPI above,  No Fever-chills, No Headache, No changes with Vision or hearing, No problems swallowing food or Liquids, No Chest pain, Cough or Shortness of Breath, No Abdominal pain, No Nausea or Vommitting, Bowel movements are regular, No Blood in stool or Urine, No new skin rashes or bruises, No new joints pains-aches,  No recent weight gain or loss, No polyuria, polydypsia or polyphagia, No significant Mental Stressors.  A full 10 point Review of Systems was done, except as stated above, all other Review of Systems were negative.   Social History Social History   Tobacco Use   . Smoking status: Never Smoker  . Smokeless tobacco: Never Used  Substance Use Topics  . Alcohol use: Not on file     Family History History reviewed. No pertinent family history.   Prior to Admission medications   Medication Sig Start Date End Date Taking? Authorizing Provider  Carboxymethylcell-Hypromellose 0.25-0.3 % GEL Place 1 drop into both eyes daily as needed (dry eyes).   Yes [provider]  celecoxib (CELEBREX) 200 MG capsule Take 100 mg by mouth daily.    Yes [provider]  diltiazem (CARDIZEM CD) 180 MG 24 hr capsule Take 180 mg by mouth daily. 03/10/19  Yes [provider]  ipratropium (ATROVENT) 0.06 % nasal spray Place 2 sprays into both nostrils 4 (four) times daily as needed for rhinitis.   Yes [provider]  levothyroxine (SYNTHROID) 112 MCG tablet Take 112-168 mcg by mouth See admin instructions. Take 1 1/2 tablets (179mcg) for two days, then 1 tablet  (180mcg) daily. 02/13/19  Yes [provider]  losartan (COZAAR) 25 MG  tablet Take 25 mg by mouth daily. 12/17/18  Yes [provider]  mometasone (NASONEX) 50 MCG/ACT nasal spray Place 2 sprays into the nose daily.   Yes [provider]  Multiple Vitamin (MULTIVITAMIN) capsule Take 1 capsule by mouth daily.   Yes [provider]  omeprazole (PRILOSEC) 40 MG capsule Take 40 mg by mouth 2 (two) times daily. 03/10/19  Yes [provider]  polyethylene glycol (MIRALAX / GLYCOLAX) 17 g packet Take 17 g by mouth daily as needed for constipation.   Yes [provider]  traMADol (ULTRAM) 50 MG tablet Take 50 mg by mouth 2 (two) times daily as needed for pain. 12/17/18  Yes [provider]  triamcinolone cream (KENALOG) 0.1 % Apply 1 application topically 2 (two) times daily.   Yes [provider]  vitamin C (ASCORBIC ACID) 500 MG tablet Take 500 mg by mouth daily.   Yes [provider]  Vitamin D, Ergocalciferol,  (DRISDOL) 1.25 MG (50000 UT) CAPS capsule Take 50,000 Units by mouth once a week. 01/09/19  Yes [provider]  apixaban (ELIQUIS) 2.5 MG TABS tablet Take 1 tablet (2.5 mg total) by mouth 2 (two) times daily. 03/31/19   Donzetta Starch, NP  atorvastatin (LIPITOR) 20 MG tablet Take 1 tablet (20 mg total) by mouth daily at 6 PM. 03/31/19   Donzetta Starch, NP  celecoxib (CELEBREX) 100 MG capsule Take 1 capsule (100 mg total) by mouth daily. 04/01/19   Donzetta Starch, NP  diltiazem (CARDIZEM CD) 240 MG 24 hr capsule Take 1 capsule (240 mg total) by mouth daily. 04/01/19   Donzetta Starch, NP  esomeprazole (NEXIUM) 40 MG capsule Take 40 mg by mouth daily at 12 noon.    [provider]  levothyroxine (SYNTHROID) 112 MCG tablet Take 1 tablet (112 mcg total) by mouth daily. 03/31/19   Donzetta Starch, NP  Maltodextrin-Xanthan Gum (RESOURCE THICKENUP CLEAR) POWD Take 120 g by mouth as needed (dysphagia - for honey thick liquid consistency). 03/31/19   Donzetta Starch, NP    Allergies  Allergen Reactions  . Chocolate     unknown  . Gabapentin     unknown  . Penicillins     Unknown  ( not found in DR records sent from [facility])  . Tetracyclines & Related     unknown  . Tape Rash    Tears skin    Physical Exam  Vitals  Blood pressure 110/60, pulse 86, temperature 97.6 F (36.4 C), resp. rate 17, height 5\' 2"  (1.575 m), weight 52 kg, SpO2 91 %.   General appearance elderly female, chronically ill .  Alert and awake, pleasantly confused HEENT no jaundice or pallor, no facial deviation oral thrush Neck supple, no neck vein distention Chest good air entry bilaterally with decreased breath sounds at the bases Heart irregularly irregular with systolic murmur Abdomen soft, nontender, bowel sounds present Extremities no clubbing cyanosis mild dependent edema Neuro left-sided weakness noted .  Left hand contracted   Data Review  CBC Recent Labs  Lab 03/26/19 1858  03/27/19 0235  03/28/19 0154 03/29/19 0340 03/30/19 0410 03/31/19 0717  WBC 16.2*  --  16.1* 15.8* 14.6* 12.4* 11.5*  HGB 12.5   < > 13.5 11.8* 11.0* 11.5* 12.6  HCT 37.4   < > 40.7 36.1 33.4* 34.4* 37.9  PLT 190  --  202 164 153 153 211  MCV 93.5  --  92.1 93.3 93.8 92.5 91.3  MCH 31.3  --  30.5 30.5 30.9 30.9 30.4  MCHC 33.4  --  33.2 32.7 32.9 33.4 33.2  RDW 13.2  --  13.2 13.8 13.5 13.3 13.1  LYMPHSABS 1.1  --  2.3  --   --   --   --   MONOABS 1.1*  --  0.8  --   --   --   --   EOSABS 0.0  --  0.0  --   --   --   --   BASOSABS 0.1  --  0.0  --   --   --   --    < > = values in this interval not displayed.   ------------------------------------------------------------------------------------------------------------------  Chemistries  Recent Labs  Lab 03/26/19 1858  03/26/19 2327 03/27/19 0235 03/28/19 0154 03/29/19 0340 03/30/19 0410 03/31/19 0717  NA 134*   < >  --  133* 137 136 135 134*  K 4.5   < >  --  3.6 4.0 3.7 3.8 3.4*  CL 100   < >  --  99 106 105 106 101  CO2 25  --   --  22 23 21* 21* 24  GLUCOSE 165*   < >  --  201* 102* 89 95 104*  BUN 14   < >  --  11 14 14 13 10   CREATININE 0.96   < >  --  0.69 0.81 0.76 0.58 0.60  CALCIUM 9.1  --   --  9.0 8.2* 8.0* 7.9* 8.1*  MG  --   --  2.0  --  1.9 2.1  --   --   AST 29  --   --   --   --   --   --   --   ALT 14  --   --   --   --   --   --   --   ALKPHOS 57  --   --   --   --   --   --   --   BILITOT 0.9  --   --   --   --   --   --   --    < > = values in this interval not displayed.   ------------------------------------------------------------------------------------------------------------------ estimated creatinine clearance is 39.9 mL/min (by C-G formula based on SCr of 0.6 mg/dL). ------------------------------------------------------------------------------------------------------------------ No results for input(s): TSH, T4TOTAL, T3FREE, THYROIDAB in the last 72 hours.  Invalid input(s): FREET3   Coagulation  profile Recent Labs  Lab 03/26/19 1858  INR 1.0   ------------------------------------------------------------------------------------------------------------------- No results for input(s): DDIMER in the last 72 hours. -------------------------------------------------------------------------------------------------------------------  Cardiac Enzymes No results for input(s): CKMB, TROPONINI, MYOGLOBIN in the last 168 hours.  Invalid input(s): CK ------------------------------------------------------------------------------------------------------------------ Invalid input(s): POCBNP   ---------------------------------------------------------------------------------------------------------------  Urinalysis    Component Value Date/Time   COLORURINE YELLOW 03/26/2019 2030   [place] 03/26/2019 2030   LABSPEC 1.019 03/26/2019 2030   PHURINE 7.0 03/26/2019 2030   GLUCOSEU NEGATIVE 03/26/2019 2030   [place] NEGATIVE 03/26/2019 2030   [place] NEGATIVE 03/26/2019 2030   [place] 03/26/2019 2030   PROTEINUR NEGATIVE 03/26/2019 2030   NITRITE NEGATIVE 03/26/2019 2030   LEUKOCYTESUR MODERATE (A) 03/26/2019 2030    ----------------------------------------------------------------------------------------------------------------  Imaging results:   Ct Code Stroke Cta Head W/wo Contrast  Result Date: 03/26/2019 CLINICAL DATA:  Left-sided weakness EXAM: CT ANGIOGRAPHY HEAD AND NECK CT PERFUSION BRAIN TECHNIQUE: Multidetector CT imaging of the head and neck was performed using  the standard protocol during bolus administration of intravenous contrast. Multiplanar CT image reconstructions and MIPs were obtained to evaluate the vascular anatomy. Carotid stenosis measurements (when applicable) are obtained utilizing NASCET criteria, using the distal internal carotid diameter as the denominator. Multiphase CT imaging of the brain was performed following IV bolus contrast  injection. Subsequent parametric perfusion maps were calculated using RAPID software. CONTRAST:  170mL OMNIPAQUE IOHEXOL 350 MG/ML SOLN COMPARISON:  Head CT 03/26/2019 FINDINGS: CTA NECK FINDINGS SKELETON: Multilevel degenerative disc disease and facet hypertrophy without bony spinal canal stenosis. OTHER NECK: Normal pharynx, larynx and major salivary glands. No cervical lymphadenopathy. Unremarkable thyroid gland. UPPER CHEST: No pneumothorax or pleural effusion. No nodules or masses. AORTIC ARCH: There is mild calcific atherosclerosis of the aortic arch. There is no aneurysm, dissection or hemodynamically significant stenosis of the visualized ascending aorta and aortic arch. Normal variant aortic arch branching pattern with the brachiocephalic and left common carotid arteries sharing a common origin. The visualized proximal subclavian arteries are widely patent. RIGHT CAROTID SYSTEM: --Common carotid artery: Widely patent origin without common carotid artery dissection or aneurysm. --Internal carotid artery: No dissection, occlusion or aneurysm. Mild atherosclerotic calcification at the carotid bifurcation without hemodynamically significant stenosis. --External carotid artery: No acute abnormality. LEFT CAROTID SYSTEM: --Common carotid artery: Widely patent origin without common carotid artery dissection or aneurysm. --Internal carotid artery: No dissection, occlusion or aneurysm. Mild atherosclerotic calcification at the carotid bifurcation without hemodynamically significant stenosis. --External carotid artery: No acute abnormality. VERTEBRAL ARTERIES: Left dominant configuration. Both origins are clearly patent. No dissection, occlusion or flow-limiting stenosis to the skull base (V1-V3 segments). CTA HEAD FINDINGS POSTERIOR CIRCULATION: --Vertebral arteries: The right vertebral artery terminates in PICA. Left vertebral artery V4 segment is normal. --Posterior inferior cerebellar arteries (PICA): Normal  --Anterior inferior cerebellar arteries (AICA): Patent origins from the basilar artery. --Basilar artery: Normal. --Superior cerebellar arteries: Normal. --Posterior cerebral arteries: Normal. The right PCA is predominantly supplied by the posterior communicating artery. ANTERIOR CIRCULATION: --Intracranial internal carotid arteries: Normal. --Anterior cerebral arteries (ACA): Normal. Both A1 segments are present. Patent anterior communicating artery (a-comm). --Middle cerebral arteries (MCA): On the right, there is occlusion of a mid to distal right M2 branch (series 11, image 24 and series 12, image 12). VENOUS SINUSES: As permitted by contrast timing, patent. ANATOMIC VARIANTS: None Review of the MIP images confirms the above findings. CT Brain Perfusion Findings: ASPECTS: 8 CBF (<30%) Volume: 34mL Perfusion (Tmax>6.0s) volume: 24mL Mismatch Volume: 60mL Infarction Location:Right MCA territory, predominantly the right frontal operculum IMPRESSION: 1. Occlusion of a mid-distal right M2 branch, corresponding to the area of ischemia on the perfusion scan. These results were called by telephone at the time of interpretation on 03/26/2019 at 7:56 pm to Dr.ERIC [facility] , who verbally acknowledged these results. 2. 5 mL right MCA territory infarction, predominantly in the right frontal operculum, with 22 mL area of ischemic penumbra. 3. No hemodynamically significant stenosis of the carotid or vertebral arteries by the NASCET criteria. Aortic Atherosclerosis (ICD10-I70.0). Electronically Signed   By: Ulyses Jarred M.D.   On: 03/26/2019 20:07   Dg Elbow Complete Left (3+view)  Result Date: 03/30/2019 CLINICAL DATA:  Generalized left arm pain. EXAM: LEFT ELBOW - COMPLETE 3+ VIEW COMPARISON:  None. FINDINGS: Bones are diffusely demineralized. No acute fracture. No subluxation or dislocation. Fat pad elevation is consistent with joint effusion. Cortical irregularity noted at the lateral epicondyle, likely due to repetitive  stress injury. IMPRESSION: Joint effusion at the elbow without acute  bony abnormality evident. Electronically Signed   By: Misty Stanley M.D.   On: 03/30/2019 15:19   Ct Code Stroke Cta Neck W/wo Contrast  Result Date: 03/26/2019 CLINICAL DATA:  Left-sided weakness EXAM: CT ANGIOGRAPHY HEAD AND NECK CT PERFUSION BRAIN TECHNIQUE: Multidetector CT imaging of the head and neck was performed using the standard protocol during bolus administration of intravenous contrast. Multiplanar CT image reconstructions and MIPs were obtained to evaluate the vascular anatomy. Carotid stenosis measurements (when applicable) are obtained utilizing NASCET criteria, using the distal internal carotid diameter as the denominator. Multiphase CT imaging of the brain was performed following IV bolus contrast injection. Subsequent parametric perfusion maps were calculated using RAPID software. CONTRAST:  159mL OMNIPAQUE IOHEXOL 350 MG/ML SOLN COMPARISON:  Head CT 03/26/2019 FINDINGS: CTA NECK FINDINGS SKELETON: Multilevel degenerative disc disease and facet hypertrophy without bony spinal canal stenosis. OTHER NECK: Normal pharynx, larynx and major salivary glands. No cervical lymphadenopathy. Unremarkable thyroid gland. UPPER CHEST: No pneumothorax or pleural effusion. No nodules or masses. AORTIC ARCH: There is mild calcific atherosclerosis of the aortic arch. There is no aneurysm, dissection or hemodynamically significant stenosis of the visualized ascending aorta and aortic arch. Normal variant aortic arch branching pattern with the brachiocephalic and left common carotid arteries sharing a common origin. The visualized proximal subclavian arteries are widely patent. RIGHT CAROTID SYSTEM: --Common carotid artery: Widely patent origin without common carotid artery dissection or aneurysm. --Internal carotid artery: No dissection, occlusion or aneurysm. Mild atherosclerotic calcification at the carotid bifurcation without hemodynamically  significant stenosis. --External carotid artery: No acute abnormality. LEFT CAROTID SYSTEM: --Common carotid artery: Widely patent origin without common carotid artery dissection or aneurysm. --Internal carotid artery: No dissection, occlusion or aneurysm. Mild atherosclerotic calcification at the carotid bifurcation without hemodynamically significant stenosis. --External carotid artery: No acute abnormality. VERTEBRAL ARTERIES: Left dominant configuration. Both origins are clearly patent. No dissection, occlusion or flow-limiting stenosis to the skull base (V1-V3 segments). CTA HEAD FINDINGS POSTERIOR CIRCULATION: --Vertebral arteries: The right vertebral artery terminates in PICA. Left vertebral artery V4 segment is normal. --Posterior inferior cerebellar arteries (PICA): Normal --Anterior inferior cerebellar arteries (AICA): Patent origins from the basilar artery. --Basilar artery: Normal. --Superior cerebellar arteries: Normal. --Posterior cerebral arteries: Normal. The right PCA is predominantly supplied by the posterior communicating artery. ANTERIOR CIRCULATION: --Intracranial internal carotid arteries: Normal. --Anterior cerebral arteries (ACA): Normal. Both A1 segments are present. Patent anterior communicating artery (a-comm). --Middle cerebral arteries (MCA): On the right, there is occlusion of a mid to distal right M2 branch (series 11, image 24 and series 12, image 12). VENOUS SINUSES: As permitted by contrast timing, patent. ANATOMIC VARIANTS: None Review of the MIP images confirms the above findings. CT Brain Perfusion Findings: ASPECTS: 8 CBF (<30%) Volume: 26mL Perfusion (Tmax>6.0s) volume: 73mL Mismatch Volume: 19mL Infarction Location:Right MCA territory, predominantly the right frontal operculum IMPRESSION: 1. Occlusion of a mid-distal right M2 branch, corresponding to the area of ischemia on the perfusion scan. These results were called by telephone at the time of interpretation on 03/26/2019 at  7:56 pm to Dr.ERIC [facility] , who verbally acknowledged these results. 2. 5 mL right MCA territory infarction, predominantly in the right frontal operculum, with 22 mL area of ischemic penumbra. 3. No hemodynamically significant stenosis of the carotid or vertebral arteries by the NASCET criteria. Aortic Atherosclerosis (ICD10-I70.0). Electronically Signed   By: Ulyses Jarred M.D.   On: 03/26/2019 20:07   Ct Cervical Spine Wo Contrast  Result Date: 03/27/2019 CLINICAL DATA:  Fall and acute stroke EXAM: CT CERVICAL SPINE WITHOUT CONTRAST TECHNIQUE: Multidetector CT imaging of the cervical spine was performed without intravenous contrast. Multiplanar CT image reconstructions were also generated. COMPARISON:  None. FINDINGS: Alignment: No static subluxation. Facets are aligned. Occipital condyles and the lateral masses of C1 and C2 are normally approximated. Skull base and vertebrae: No acute fracture. Soft tissues and spinal canal: No prevertebral fluid or swelling. No visible canal hematoma. Disc levels: No advanced spinal canal or neural foraminal stenosis. Marked hypertrophy of the C1-2 level. Disc space narrowing is greatest at C5-6. Multilevel facet hypertrophy. Upper chest: No pneumothorax, pulmonary nodule or pleural effusion. Other: Normal visualized paraspinal cervical soft tissues. IMPRESSION: No acute fracture or static subluxation of the cervical spine. Electronically Signed   By: Ulyses Jarred M.D.   On: 03/27/2019 01:32   Mr Brain Wo Contrast  Result Date: 03/27/2019 CLINICAL DATA:  Slurred speech with left-sided facial droop EXAM: MRI HEAD WITHOUT CONTRAST TECHNIQUE: Multiplanar, multiecho pulse sequences of the brain and surrounding structures were obtained without intravenous contrast. COMPARISON:  Head CT 03/26/2019 FINDINGS: BRAIN: Multifocal acute ischemia throughout the right MCA territory, greatest along the right precentral gyrus and within the inferior lateral right frontal lobe.  Moderate ischemia the right basal ganglia. Multifocal white matter hyperintensity, most commonly due to chronic ischemic microangiopathy. The cerebral and cerebellar volume are age-appropriate. There is no hydrocephalus. The midline structures are normal. No acute hemorrhage. VASCULAR: The major intracranial arterial and venous sinus flow voids are normal. Susceptibility-sensitive sequences show no chronic microhemorrhage or superficial siderosis. SKULL AND UPPER CERVICAL SPINE: Calvarial bone marrow signal is normal. There is no skull base mass. The visualized upper cervical spine and soft tissues are normal. SINUSES/ORBITS: There are no fluid levels or advanced mucosal thickening. The mastoid air cells and middle ear cavities are free of fluid. The orbits are normal. IMPRESSION: Multifocal ischemic infarction of the right MCA territory without acute hemorrhage or mass effect. Electronically Signed   By: Ulyses Jarred M.D.   On: 03/27/2019 02:09   Ct Code Stroke Cta Cerebral Perfusion W/wo Contrast  Result Date: 03/26/2019 CLINICAL DATA:  Left-sided weakness EXAM: CT ANGIOGRAPHY HEAD AND NECK CT PERFUSION BRAIN TECHNIQUE: Multidetector CT imaging of the head and neck was performed using the standard protocol during bolus administration of intravenous contrast. Multiplanar CT image reconstructions and MIPs were obtained to evaluate the vascular anatomy. Carotid stenosis measurements (when applicable) are obtained utilizing NASCET criteria, using the distal internal carotid diameter as the denominator. Multiphase CT imaging of the brain was performed following IV bolus contrast injection. Subsequent parametric perfusion maps were calculated using RAPID software. CONTRAST:  132mL OMNIPAQUE IOHEXOL 350 MG/ML SOLN COMPARISON:  Head CT 03/26/2019 FINDINGS: CTA NECK FINDINGS SKELETON: Multilevel degenerative disc disease and facet hypertrophy without bony spinal canal stenosis. OTHER NECK: Normal pharynx, larynx and  major salivary glands. No cervical lymphadenopathy. Unremarkable thyroid gland. UPPER CHEST: No pneumothorax or pleural effusion. No nodules or masses. AORTIC ARCH: There is mild calcific atherosclerosis of the aortic arch. There is no aneurysm, dissection or hemodynamically significant stenosis of the visualized ascending aorta and aortic arch. Normal variant aortic arch branching pattern with the brachiocephalic and left common carotid arteries sharing a common origin. The visualized proximal subclavian arteries are widely patent. RIGHT CAROTID SYSTEM: --Common carotid artery: Widely patent origin without common carotid artery dissection or aneurysm. --Internal carotid artery: No dissection, occlusion or aneurysm. Mild atherosclerotic calcification at the carotid bifurcation without hemodynamically significant stenosis. --  External carotid artery: No acute abnormality. LEFT CAROTID SYSTEM: --Common carotid artery: Widely patent origin without common carotid artery dissection or aneurysm. --Internal carotid artery: No dissection, occlusion or aneurysm. Mild atherosclerotic calcification at the carotid bifurcation without hemodynamically significant stenosis. --External carotid artery: No acute abnormality. VERTEBRAL ARTERIES: Left dominant configuration. Both origins are clearly patent. No dissection, occlusion or flow-limiting stenosis to the skull base (V1-V3 segments). CTA HEAD FINDINGS POSTERIOR CIRCULATION: --Vertebral arteries: The right vertebral artery terminates in PICA. Left vertebral artery V4 segment is normal. --Posterior inferior cerebellar arteries (PICA): Normal --Anterior inferior cerebellar arteries (AICA): Patent origins from the basilar artery. --Basilar artery: Normal. --Superior cerebellar arteries: Normal. --Posterior cerebral arteries: Normal. The right PCA is predominantly supplied by the posterior communicating artery. ANTERIOR CIRCULATION: --Intracranial internal carotid arteries: Normal.  --Anterior cerebral arteries (ACA): Normal. Both A1 segments are present. Patent anterior communicating artery (a-comm). --Middle cerebral arteries (MCA): On the right, there is occlusion of a mid to distal right M2 branch (series 11, image 24 and series 12, image 12). VENOUS SINUSES: As permitted by contrast timing, patent. ANATOMIC VARIANTS: None Review of the MIP images confirms the above findings. CT Brain Perfusion Findings: ASPECTS: 8 CBF (<30%) Volume: 3mL Perfusion (Tmax>6.0s) volume: 89mL Mismatch Volume: 56mL Infarction Location:Right MCA territory, predominantly the right frontal operculum IMPRESSION: 1. Occlusion of a mid-distal right M2 branch, corresponding to the area of ischemia on the perfusion scan. These results were called by telephone at the time of interpretation on 03/26/2019 at 7:56 pm to Dr.ERIC [facility] , who verbally acknowledged these results. 2. 5 mL right MCA territory infarction, predominantly in the right frontal operculum, with 22 mL area of ischemic penumbra. 3. No hemodynamically significant stenosis of the carotid or vertebral arteries by the NASCET criteria. Aortic Atherosclerosis (ICD10-I70.0). Electronically Signed   By: Ulyses Jarred M.D.   On: 03/26/2019 20:07   Dg Chest Port 1 View  Result Date: 03/29/2019 CLINICAL DATA:  Respiratory failure. EXAM: PORTABLE CHEST 1 VIEW COMPARISON:  03/28/2019 FINDINGS: Patient is rotated to the left. Lungs are adequately inflated with minimal hazy left base density which may be due to small effusion/atelectasis. Cardiomediastinal silhouette and remainder of the exam is unchanged. IMPRESSION: Minimal hazy left base opacification which may be due to small effusion/atelectasis. Electronically Signed   By: Marin Olp M.D.   On: 03/29/2019 10:09   Dg Chest Port 1 View  Result Date: 03/28/2019 CLINICAL DATA:  Abnormal respirations.  History of stroke. EXAM: PORTABLE CHEST 1 VIEW COMPARISON:  Single-view of the chest 03/26/2019. FINDINGS:  Endotracheal tube has been removed. Mild right basilar atelectasis is seen. Lungs are otherwise clear. Cardiomegaly. Atherosclerosis. No pneumothorax or pleural effusion. IMPRESSION: Status post extubation. Cardiomegaly without acute disease. Right basilar atelectasis noted. Electronically Signed   By: Inge Rise M.D.   On: 03/28/2019 08:51   Dg Chest Port 1 View  Result Date: 03/26/2019 CLINICAL DATA:  Stroke EXAM: PORTABLE CHEST 1 VIEW COMPARISON:  None. FINDINGS: Endotracheal tube is 4 cm above the carina. NG tube is in the stomach. Heart is normal size. Increased markings throughout the lungs, likely chronic/scarring. No confluent opacities or effusions. IMPRESSION: Chronic changes.  No acute cardiopulmonary disease. Electronically Signed   By: Rolm Baptise M.D.   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 Electronically Signed   By: Dellis Filbert  Hu M.D.   On: 03/30/2019 15:11   Result Date: 03/30/2019 CLINICAL DATA:  Generalized LEFT shoulder pain.  Limited mobility. EXAM: LEFT SHOULDER - 2+ VIEW COMPARISON:  None. FINDINGS: High-riding humeral head and MEDIAL position of the humeral head is unchanged from prior radiographs. Moderate to severe degenerative changes at the glenohumeral joint noted. No acute fracture noted. No suspicious focal bony lesions present. IMPRESSION: Unchanged high-riding and MEDIAL position of the humeral head with moderate to severe degenerative changes. It is difficult to determine if the humeral head is located on this study, but not acute. Electronically Signed: By: Margarette Canada M.D. On: 03/30/2019 14:59   Dg Swallowing Func-speech Pathology  Result Date: 03/28/2019 Objective Swallowing Evaluation: Type of Study: MBS-Modified Barium Swallow Study  Patient Details Name: Brittain Anstey MRN: [id number] Date of  Birth: 02-02-1931 Today's Date: 03/28/2019 Time: SLP Start Time (ACUTE ONLY): [id number] -SLP Stop Time (ACUTE ONLY): 1323 SLP Time Calculation (min) (ACUTE ONLY): 20 min Past Medical History: No past medical history on file. Past Surgical History: Past Surgical History: Procedure Laterality Date . RADIOLOGY WITH ANESTHESIA Left 03/26/2019  Procedure: RADIOLOGY WITH ANESTHESIA;  Surgeon: Luanne Bras, MD;  Location: [facility];  Service: Radiology;  Laterality: Left; HPI: Pt is an 85 y.o. female who lives independently and presented to teh ED secondary to fall from a standing position, slurred speech, left sided facial droop, right gaze preference and left arm weakness. MRI of the brain showed multifocal ischemic infarction of the right MCA territory without acute hemorrhage or mass effect. Intubated 9/9-9/10.  No data recorded Assessment / Plan / Recommendation CHL IP CLINICAL IMPRESSIONS 03/28/2019 Clinical Impression Mild-moderate oropharyngeal dysphagia with decreased oral cohesion, laryngeal penetration and residual. Control and propulsion of boluses was challenging for pt and she used head extention to assist in bolus transfer to oropharynx. Anterior leakage on left side and lingual residue spilling into pharynx, laryngeal vestibule reaching vocal cords without adequate sensation to expel. Nectar thick consistently penetrated due to inadequate timing and incomplete epiglottic inversion. Cues for cough were ineffective to remove. Vallecular residue ranged mild to max and mild in pyriform sinuses. Of note pt was sleepy this afternoon in comparison to bedside assessment. Dys 1 (puree), honey thick recommended, with assist, two swallows and pills whole in puree. Son educated in room after study and voiced understanding of recommendations.       SLP Visit Diagnosis Dysphagia, oropharyngeal phase (R13.12) Attention and concentration deficit following -- Frontal lobe and executive function deficit following -- Impact on safety  and function Moderate aspiration risk   CHL IP TREATMENT RECOMMENDATION 03/28/2019 Treatment Recommendations Therapy as outlined in treatment plan below   Prognosis 03/28/2019 Prognosis for Safe Diet Advancement Good Barriers to Reach Goals -- Barriers/Prognosis Comment -- CHL IP DIET RECOMMENDATION 03/28/2019 SLP Diet Recommendations Dysphagia 1 (Puree) solids;Honey thick liquids Liquid Administration via Cup Medication Administration Whole meds with puree Compensations Slow rate;Small sips/bites;Multiple dry swallows after each bite/sip;Lingual sweep for clearance of pocketing Postural Changes Seated upright at 90 degrees   CHL IP OTHER RECOMMENDATIONS 03/28/2019 Recommended Consults -- Oral Care Recommendations Oral care BID Other Recommendations --   CHL IP FOLLOW UP RECOMMENDATIONS 03/28/2019 Follow up Recommendations 24 hour supervision/assistance   CHL IP FREQUENCY AND DURATION 03/28/2019 Speech Therapy Frequency (ACUTE ONLY) min 2x/week Treatment Duration 2 weeks      CHL IP ORAL PHASE 03/28/2019 Oral Phase Impaired Oral - Pudding Teaspoon -- Oral - Pudding Cup -- Oral - Honey Teaspoon Left anterior  bolus loss;Weak lingual manipulation;Reduced posterior propulsion;Delayed oral transit Oral - Honey Cup Left anterior bolus loss;Weak lingual manipulation;Reduced posterior propulsion;Delayed oral transit Oral - Nectar Teaspoon -- Oral - Nectar Cup Lingual/palatal residue;Weak lingual manipulation;Left anterior bolus loss;Reduced posterior propulsion;Decreased bolus cohesion;Delayed oral transit Oral - Nectar Straw -- Oral - Thin Teaspoon -- Oral - Thin Cup -- Oral - Thin Straw -- Oral - Puree Decreased bolus cohesion;Delayed oral transit;Reduced posterior propulsion Oral - Mech Soft -- Oral - Regular -- Oral - Multi-Consistency -- Oral - Pill -- Oral Phase - Comment --  CHL IP PHARYNGEAL PHASE 03/28/2019 Pharyngeal Phase Impaired Pharyngeal- Pudding Teaspoon -- Pharyngeal -- Pharyngeal- Pudding Cup -- Pharyngeal --  Pharyngeal- Honey Teaspoon Pharyngeal residue - valleculae;Pharyngeal residue - pyriform Pharyngeal -- Pharyngeal- Honey Cup Penetration/Aspiration during swallow;Pharyngeal residue - valleculae;Pharyngeal residue - pyriform Pharyngeal Material enters airway, remains ABOVE vocal cords and not ejected out Pharyngeal- Nectar Teaspoon -- Pharyngeal -- Pharyngeal- Nectar Cup Pharyngeal residue - pyriform;Pharyngeal residue - valleculae;Penetration/Aspiration before swallow Pharyngeal Material enters airway, remains ABOVE vocal cords then ejected out Pharyngeal- Nectar Straw -- Pharyngeal -- Pharyngeal- Thin Teaspoon -- Pharyngeal -- Pharyngeal- Thin Cup -- Pharyngeal -- Pharyngeal- Thin Straw -- Pharyngeal -- Pharyngeal- Puree Pharyngeal residue - valleculae;Delayed swallow initiation-pyriform sinuses Pharyngeal -- Pharyngeal- Mechanical Soft -- Pharyngeal -- Pharyngeal- Regular -- Pharyngeal -- Pharyngeal- Multi-consistency -- Pharyngeal -- Pharyngeal- Pill -- Pharyngeal -- Pharyngeal Comment --  CHL IP CERVICAL ESOPHAGEAL PHASE 03/28/2019 Cervical Esophageal Phase WFL Pudding Teaspoon -- Pudding Cup -- Honey Teaspoon -- Honey Cup -- Nectar Teaspoon -- Nectar Cup -- Nectar Straw -- Thin Teaspoon -- Thin Cup -- Thin Straw -- Puree -- Mechanical Soft -- Regular -- Multi-consistency -- Pill -- Cervical Esophageal Comment -- [place] Siren 03/28/2019, 3:11 PM Orbie Pyo Litaker M.Ed Actor Pager [phone] Office [phone]              Ct Head Code Stroke Wo Contrast  Result Date: 03/26/2019 CLINICAL DATA:  Code stroke.  Slurred speech EXAM: CT HEAD WITHOUT CONTRAST TECHNIQUE: Contiguous axial images were obtained from the base of the skull through the vertex without intravenous contrast. COMPARISON:  None. FINDINGS: Brain: There is no mass, hemorrhage or extra-axial collection. The size and configuration of the ventricles and extra-axial CSF spaces are normal. There is hypoattenuation of  the periventricular white matter, most commonly indicating chronic ischemic microangiopathy. Vascular: No abnormal hyperdensity of the major intracranial arteries or dural venous sinuses. No intracranial atherosclerosis. Skull: The visualized skull base, calvarium and extracranial soft tissues are normal. Sinuses/Orbits: No fluid levels or advanced mucosal thickening of the visualized paranasal sinuses. No mastoid or middle ear effusion. The orbits are normal. ASPECTS [facility] Stroke Program Early CT Score) - Ganglionic level infarction (caudate, lentiform nuclei, internal capsule, insula, M1-M3 cortex): 5 - Supraganglionic infarction (M4-M6 cortex): 3 Total score (0-10 with 10 being normal): 8 IMPRESSION: 1. No intracranial hemorrhage. 2. ASPECTS is 8 These results were called by telephone at the time of interpretation on 03/26/2019 at 7:46 pm to provider Dr. Kerney Elbe, who verbally acknowledged these results. Electronically Signed   By: Ulyses Jarred M.D.   On: 03/26/2019 19:48   Vas [place] Lower Extremity Venous (dvt)  Result Date: 03/27/2019  Lower Venous Study Indications: Stroke.  Comparison Study: No priors. Performing Technologist: Oda Cogan RDMS, RVT  Examination Guidelines: A complete evaluation includes B-mode imaging, spectral Doppler, color Doppler, and power Doppler as needed of all accessible portions of each vessel. Bilateral testing  is considered an integral part of a complete examination. Limited examinations for reoccurring indications may be performed as noted.  +---------+---------------+---------+-----------+----------+--------------+ RIGHT    CompressibilityPhasicitySpontaneityPropertiesThrombus Aging +---------+---------------+---------+-----------+----------+--------------+ CFV      Full           Yes      Yes                                 +---------+---------------+---------+-----------+----------+--------------+ SFJ      Full                                                         +---------+---------------+---------+-----------+----------+--------------+ FV Prox  Full                                                        +---------+---------------+---------+-----------+----------+--------------+ FV Mid   Full                                                        +---------+---------------+---------+-----------+----------+--------------+ FV DistalFull                                                        +---------+---------------+---------+-----------+----------+--------------+ PFV      Full                                                        +---------+---------------+---------+-----------+----------+--------------+ POP      Full           Yes      Yes                                 +---------+---------------+---------+-----------+----------+--------------+ PTV      Full                                                        +---------+---------------+---------+-----------+----------+--------------+ PERO     Full                                                        +---------+---------------+---------+-----------+----------+--------------+   +---------+---------------+---------+-----------+----------+--------------+ LEFT     CompressibilityPhasicitySpontaneityPropertiesThrombus Aging +---------+---------------+---------+-----------+----------+--------------+ CFV      Full  Yes      Yes                                 +---------+---------------+---------+-----------+----------+--------------+ SFJ      Full                                                        +---------+---------------+---------+-----------+----------+--------------+ FV Prox  Full                                                        +---------+---------------+---------+-----------+----------+--------------+ FV Mid   Full                                                         +---------+---------------+---------+-----------+----------+--------------+ FV DistalFull                                                        +---------+---------------+---------+-----------+----------+--------------+ PFV      Full                                                        +---------+---------------+---------+-----------+----------+--------------+ POP      Full           Yes      Yes                                 +---------+---------------+---------+-----------+----------+--------------+ PTV      Full                                                        +---------+---------------+---------+-----------+----------+--------------+ PERO     Full                                                        +---------+---------------+---------+-----------+----------+--------------+     Summary: Right: There is no evidence of deep vein thrombosis in the lower extremity. No cystic structure found in the popliteal fossa. Left: There is no evidence of deep vein thrombosis in the lower extremity. A cystic structure is found in the popliteal fossa.  *See table(s) above for measurements and observations. Electronically signed by Monica Martinez MD on 03/27/2019 at 3:20:28 PM.    Final  Ir Angio Intra Extracran Sel Com Carotid Innominate Uni R Mod Sed  Result Date: 03/31/2019 CLINICAL DATA:  Acute onset of left-sided neglect. Left-sided weakness. Suspected occlusion of the superior division of the right middle cerebral artery M2 mid to distal region. EXAM: IR ANGIO VERTEBRAL SEL SUBCLAVIAN INNOMINATE UNI RIGHT MOD SED; IR ANGIO INTRA EXTRACRAN SEL COM CAROTID INNOMINATE UNI RIGHT MOD SED COMPARISON:  CT angiogram of the head and neck of 03/26/2019. MEDICATIONS: Heparin 0 units IV; 2 g Ancef antibiotic was administered within 1 hour of the procedure. ANESTHESIA/SEDATION: General anesthesia. CONTRAST:  Isovue 300 approximately 30 cc. FLUOROSCOPY TIME:  Fluoroscopy Time: 2  minutes 42 seconds (242 mGy). COMPLICATIONS: None immediate. TECHNIQUE: Informed written consent was obtained from the patient's son after a thorough discussion of the procedural risks, benefits and alternatives. All questions were addressed. Maximal Sterile Barrier Technique was utilized including caps, mask, sterile gowns, sterile gloves, sterile drape, hand hygiene and skin antiseptic. A timeout was performed prior to the initiation of the procedure. The right groin was prepped and draped in the usual sterile fashion. Thereafter using modified Seldinger technique, transfemoral access into the right common femoral artery was obtained without difficulty. Over a 0.035 inch guidewire, a 5 French Pinnacle sheath was inserted. Through this, and also over 0.035 inch guidewire, a 5 [place] JB 1 catheter was advanced to the aortic arch region and selectively positioned in the innominate artery and the right common carotid artery. FINDINGS: The innominate artery angiogram demonstrates the origins of the right common carotid artery and the right subclavian artery to be widely patent. The visualized portion of the right vertebral artery at its origin and distally demonstrates wide patency. The vessel is seen to opacify to the cranial skull base. The right common carotid arteriogram demonstrates the right external carotid artery and its major branches to be widely patent. The right internal carotid artery at the bulb to the cranial skull base demonstrates wide patency. Petrous, cavernous and the supraclinoid segments are widely patent. A right posterior communicating artery is seen opacifying the right posterior cerebral artery distribution. The right middle cerebral artery, and the right anterior cerebral artery opacify into the capillary and venous phases. The mid to delayed arterial phase demonstrates angiographic occlusion of an anterior perisylvian branch of the superior division of the right middle cerebral artery in the  distal M2 M3 junction. 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 Given the size of this vessel distally endovascular revascularization is felt to be too risky given the availability of the present stent retrievers. PLAN: As per referring MD. Electronically Signed   By: Luanne Bras M.D.   On: 03/27/2019 09:43   Ir Angio Vertebral Sel Subclavian Innominate Uni R Mod Sed  Result Date: 03/31/2019 CLINICAL DATA:  Acute onset of left-sided neglect. Left-sided weakness. Suspected occlusion of the superior division of the right middle cerebral artery M2 mid to distal region. EXAM: IR ANGIO VERTEBRAL SEL SUBCLAVIAN INNOMINATE UNI RIGHT MOD SED; IR ANGIO INTRA EXTRACRAN SEL COM CAROTID INNOMINATE UNI RIGHT MOD SED COMPARISON:  CT angiogram of the head and neck of 03/26/2019. MEDICATIONS: Heparin 0 units IV; 2 g Ancef antibiotic was administered within 1 hour of the procedure. ANESTHESIA/SEDATION: General anesthesia. CONTRAST:  Isovue 300 approximately 30 cc. FLUOROSCOPY TIME:  Fluoroscopy Time: 2 minutes 42 seconds (242 mGy). COMPLICATIONS: None immediate. TECHNIQUE: Informed written consent was obtained from the patient's son  after a thorough discussion of the procedural risks, benefits and alternatives. All questions were addressed. Maximal Sterile Barrier Technique was utilized including caps, mask, sterile gowns, sterile gloves, sterile drape, hand hygiene and skin antiseptic. A timeout was performed prior to the initiation of the procedure. The right groin was prepped and draped in the usual sterile fashion. Thereafter using modified Seldinger technique, transfemoral access into the right common femoral artery was obtained without difficulty. Over a 0.035 inch guidewire, a 5 French Pinnacle sheath was inserted. Through this, and also over 0.035 inch guidewire, a 5 [place] JB 1 catheter was advanced to  the aortic arch region and selectively positioned in the innominate artery and the right common carotid artery. FINDINGS: The innominate artery angiogram demonstrates the origins of the right common carotid artery and the right subclavian artery to be widely patent. The visualized portion of the right vertebral artery at its origin and distally demonstrates wide patency. The vessel is seen to opacify to the cranial skull base. The right common carotid arteriogram demonstrates the right external carotid artery and its major branches to be widely patent. The right internal carotid artery at the bulb to the cranial skull base demonstrates wide patency. Petrous, cavernous and the supraclinoid segments are widely patent. A right posterior communicating artery is seen opacifying the right posterior cerebral artery distribution. The right middle cerebral artery, and the right anterior cerebral artery opacify into the capillary and venous phases. The mid to delayed arterial phase demonstrates angiographic occlusion of an anterior perisylvian branch of the superior division of the right middle cerebral artery in the distal M2 M3 junction. 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 Given the size of this vessel distally endovascular revascularization is felt to be too risky given the availability of the present stent retrievers. PLAN: As per referring MD. Electronically Signed   By: Luanne Bras M.D.   On: 03/27/2019 09:43      Assessment & Plan  Altered mental status with hypotension and mild leukocytosis, highly suspicious for UTI at this time.  Urinalysis is not back.  Chest x-ray still pending Check urinalysis Check portable chest x-ray Monitor frequently Will start IV antibiotics after urinalysis is collected for high level of suspicion TSH checked and was normal this month Check vitamin B12  level Decrease Cardizem to 180 mg due to hypotension Check stool guaiac, patient is on Eliquis  Recent middle cerebral artery embolism, right Neurology is following  Paroxysmal A. fib with RVR , on Eliquis Continue with Cardizem, but decrease  Essential hypertension       AM Labs Ordered, also please review Full Orders  Family Communication: Discussed with son at bedside     Time spent in minutes : 46 minutes  Condition GUARDED   @SIGNATURE @

## 2019-03-31 NOTE — Progress Notes (Signed)
Pt had sudden diminshed level of verbal response, difficult to arouse. Sitting up in chair at bedside. BP = 66/44 per monitor. afib 85, o2 sat=95% on RA. Rapid Response notified, Dr Leonie Man Notified. Pt was transferred back to bed, new IV accesses established, NS bolus in progress with gradual return to baseline observed LOC from today. BP gradual return

## 2019-03-31 NOTE — Significant Event (Signed)
Rapid Response Event Note  Overview: Time Called: [id number] Arrival Time: [id number] Event Type: Hypotension, Neurologic  Initial Focused Assessment: Per staff and visitor patient was sitting up in the chair after eating lunch when she suddenly stopped interacting and became difficult to arouse. Received Cardizem CD 180mg  PO about 11am BP 64/36  HR 85  RR 18  O2 sat 96% on [place] Responds to painful stim  Interventions: Assisted to bed, placed flat BP 80/57  AF 105 PIV started left arm NS bolus started Patient becoming more alert and talkative. BP 108/63  AF 98 Oriented x3,  Mild confusion at times Dr Leonie Man at bedside BP 110/60  AF 90  RR 17  O2 sat 96%    Plan of Care (if not transferred): Increase frequency of VS VS q 15 min x 4 then about 1 hour post NS bolus if BP < 100 or if decreased LOC call RRT  Event Summary: Name of Physician Notified: Antony Contras at 1542    at    Outcome: Stayed in room and stabalized  Event End Time: 1620  Raliegh Ip

## 2019-03-31 NOTE — Discharge Summary (Addendum)
Stroke Discharge Summary  Patient ID: Nancy Zamora   MRN: [id number]      DOB: [birth year]  Date of Admission: 03/26/2019 Date of Discharge: 04/02/2019  Attending Physician:  Garvin Fila, MD, Stroke MD Consultant(s):     Olene Craven) Estanislado Pandy, MD (Interventional Neuroradiologist), Seward Carol, MD (PCCM), [place] RN Patient's PCP:  System, Pcp Not In  DISCHARGE DIAGNOSIS:  Principal Problem:   Acute embolic stroke [facility]) Active Problems:   Middle cerebral artery embolism, right   Airway intubation performed without difficulty   Malnutrition of moderate degree   Atrial fibrillation with RVR ([place])   SVT (supraventricular tachycardia) ([place])   Essential hypertension   Hyperlipidemia   Dysphagia due to recent cerebral infarction   Fall   Hypothyroidism   Osteoporosis   History reviewed. No pertinent past medical history. Past Surgical History:  Procedure Laterality Date  . IR ANGIO INTRA EXTRACRAN SEL COM CAROTID INNOMINATE UNI R MOD SED  03/26/2019  . IR ANGIO VERTEBRAL SEL SUBCLAVIAN INNOMINATE UNI R MOD SED  03/26/2019  . RADIOLOGY WITH ANESTHESIA Left 03/26/2019   Procedure: RADIOLOGY WITH ANESTHESIA;  Surgeon: Luanne Bras, MD;  Location: [facility];  Service: Radiology;  Laterality: Left;    Allergies as of 04/02/2019      Reactions   Chocolate    unknown   Gabapentin    unknown   Penicillins    Unknown  ( not found in DR records sent from [facility])   Tetracyclines & Related    unknown   Tape Rash   Tears skin      Medication List    STOP taking these medications   esomeprazole 40 MG capsule Commonly known as: NEXIUM   losartan 25 MG tablet Commonly known as: COZAAR     TAKE these medications   apixaban 2.5 MG Tabs tablet Commonly known as: ELIQUIS Take 1 tablet (2.5 mg total) by mouth 2 (two) times daily.   atorvastatin 20 MG tablet Commonly known as: LIPITOR Take 1 tablet (20 mg total) by mouth daily at 6 PM.   Carboxymethylcell-Hypromellose  0.25-0.3 % Gel Place 1 drop into both eyes daily as needed (dry eyes).   celecoxib 200 MG capsule Commonly known as: CELEBREX Take 1 capsule (200 mg total) by mouth daily. Start taking on: April 03, 2019 What changed: how much to take   cephALEXin 500 MG capsule Commonly known as: KEFLEX Take 1 capsule (500 mg total) by mouth 2 (two) times daily for 4 days.   diltiazem 180 MG 24 hr capsule Commonly known as: CARDIZEM CD Take 180 mg by mouth daily.   ipratropium 0.06 % nasal spray Commonly known as: ATROVENT Place 2 sprays into both nostrils 4 (four) times daily as needed for rhinitis.   levothyroxine 112 MCG tablet Commonly known as: SYNTHROID Take 1 tablet (112 mcg total) by mouth daily. What changed:   how much to take  when to take this  additional instructions   mometasone 50 MCG/ACT nasal spray Commonly known as: NASONEX Place 2 sprays into the nose daily.   multivitamin capsule Take 1 capsule by mouth daily.   omeprazole 40 MG capsule Commonly known as: PRILOSEC Take 40 mg by mouth 2 (two) times daily.   polyethylene glycol 17 g packet Commonly known as: MIRALAX / GLYCOLAX Take 17 g by mouth daily as needed for constipation.   Resource ThickenUp Clear Powd Take 120 g by mouth as needed (dysphagia - for honey thick liquid  consistency).   traMADol 50 MG tablet Commonly known as: ULTRAM Take 50 mg by mouth 2 (two) times daily as needed for pain.   triamcinolone cream 0.1 % Commonly known as: KENALOG Apply 1 application topically 2 (two) times daily.   vitamin C 500 MG tablet Commonly known as: ASCORBIC ACID Take 500 mg by mouth daily.   Vitamin D (Ergocalciferol) 1.25 MG (50000 UT) Caps capsule Commonly known as: DRISDOL Take 50,000 Units by mouth once a week.       LABORATORY STUDIES CBC    Component Value Date/Time   WBC 13.3 (H) 04/01/2019 0321   RBC 3.88 04/01/2019 0321   HGB 11.8 (L) 04/01/2019 0321   HCT 35.8 (L) 04/01/2019 0321    PLT 239 04/01/2019 0321   MCV 92.3 04/01/2019 0321   MCH 30.4 04/01/2019 0321   MCHC 33.0 04/01/2019 0321   RDW 13.2 04/01/2019 0321   LYMPHSABS 2.0 03/31/2019 1633   MONOABS 1.7 (H) 03/31/2019 1633   EOSABS 0.1 03/31/2019 1633   BASOSABS 0.1 03/31/2019 1633   CMP    Component Value Date/Time   NA 137 04/01/2019 0321   K 3.9 04/01/2019 0321   CL 106 04/01/2019 0321   CO2 20 (L) 04/01/2019 0321   GLUCOSE 100 (H) 04/01/2019 0321   BUN 19 04/01/2019 0321   CREATININE 0.78 04/01/2019 0321   CALCIUM 7.7 (L) 04/01/2019 0321   PROT 6.1 (L) 03/26/2019 1858   ALBUMIN 3.8 03/26/2019 1858   AST 29 03/26/2019 1858   ALT 14 03/26/2019 1858   ALKPHOS 57 03/26/2019 1858   BILITOT 0.9 03/26/2019 1858   GFRNONAA >60 04/01/2019 0321   GFRAA >60 04/01/2019 0321   COAGS Lab Results  Component Value Date   INR 1.0 03/26/2019   Lipid Panel    Component Value Date/Time   CHOL 228 (H) 03/27/2019 0235   TRIG 56 03/28/2019 0154   HDL 133 03/27/2019 0235   CHOLHDL 1.7 03/27/2019 0235   VLDL 15 03/27/2019 0235   LDLCALC 80 03/27/2019 0235   HgbA1C  Lab Results  Component Value Date   HGBA1C 5.5 03/27/2019   Urinalysis    Component Value Date/Time   COLORURINE YELLOW 03/31/2019 0410   APPEARANCEUR CLOUDY (A) 03/31/2019 0410   LABSPEC 1.020 03/31/2019 0410   PHURINE 5.0 03/31/2019 0410   GLUCOSEU NEGATIVE 03/31/2019 0410   HGBUR MODERATE (A) 03/31/2019 0410   BILIRUBINUR NEGATIVE 03/31/2019 0410   KETONESUR NEGATIVE 03/31/2019 0410   PROTEINUR 30 (A) 03/31/2019 0410   NITRITE POSITIVE (A) 03/31/2019 0410   LEUKOCYTESUR LARGE (A) 03/31/2019 0410   Urine Drug Screen     Component Value Date/Time   LABOPIA NONE DETECTED 03/26/2019 2030   COCAINSCRNUR NONE DETECTED 03/26/2019 2030   LABBENZ NONE DETECTED 03/26/2019 2030   AMPHETMU NONE DETECTED 03/26/2019 2030   THCU NONE DETECTED 03/26/2019 2030   LABBARB NONE DETECTED 03/26/2019 2030    Alcohol Level    Component Value  Date/Time   ETH <10 03/26/2019 1858     SIGNIFICANT DIAGNOSTIC STUDIES Ct Code Stroke Cta Head W/wo Contrast  Result Date: 03/26/2019 CLINICAL DATA:  Left-sided weakness EXAM: CT ANGIOGRAPHY HEAD AND NECK CT PERFUSION BRAIN TECHNIQUE: Multidetector CT imaging of the head and neck was performed using the standard protocol during bolus administration of intravenous contrast. Multiplanar CT image reconstructions and MIPs were obtained to evaluate the vascular anatomy. Carotid stenosis measurements (when applicable) are obtained utilizing NASCET criteria, using the distal internal carotid diameter  as the denominator. Multiphase CT imaging of the brain was performed following IV bolus contrast injection. Subsequent parametric perfusion maps were calculated using RAPID software. CONTRAST:  129mL OMNIPAQUE IOHEXOL 350 MG/ML SOLN COMPARISON:  Head CT 03/26/2019 FINDINGS: CTA NECK FINDINGS SKELETON: Multilevel degenerative disc disease and facet hypertrophy without bony spinal canal stenosis. OTHER NECK: Normal pharynx, larynx and major salivary glands. No cervical lymphadenopathy. Unremarkable thyroid gland. UPPER CHEST: No pneumothorax or pleural effusion. No nodules or masses. AORTIC ARCH: There is mild calcific atherosclerosis of the aortic arch. There is no aneurysm, dissection or hemodynamically significant stenosis of the visualized ascending aorta and aortic arch. Normal variant aortic arch branching pattern with the brachiocephalic and left common carotid arteries sharing a common origin. The visualized proximal subclavian arteries are widely patent. RIGHT CAROTID SYSTEM: --Common carotid artery: Widely patent origin without common carotid artery dissection or aneurysm. --Internal carotid artery: No dissection, occlusion or aneurysm. Mild atherosclerotic calcification at the carotid bifurcation without hemodynamically significant stenosis. --External carotid artery: No acute abnormality. LEFT CAROTID SYSTEM:  --Common carotid artery: Widely patent origin without common carotid artery dissection or aneurysm. --Internal carotid artery: No dissection, occlusion or aneurysm. Mild atherosclerotic calcification at the carotid bifurcation without hemodynamically significant stenosis. --External carotid artery: No acute abnormality. VERTEBRAL ARTERIES: Left dominant configuration. Both origins are clearly patent. No dissection, occlusion or flow-limiting stenosis to the skull base (V1-V3 segments). CTA HEAD FINDINGS POSTERIOR CIRCULATION: --Vertebral arteries: The right vertebral artery terminates in PICA. Left vertebral artery V4 segment is normal. --Posterior inferior cerebellar arteries (PICA): Normal --Anterior inferior cerebellar arteries (AICA): Patent origins from the basilar artery. --Basilar artery: Normal. --Superior cerebellar arteries: Normal. --Posterior cerebral arteries: Normal. The right PCA is predominantly supplied by the posterior communicating artery. ANTERIOR CIRCULATION: --Intracranial internal carotid arteries: Normal. --Anterior cerebral arteries (ACA): Normal. Both A1 segments are present. Patent anterior communicating artery (a-comm). --Middle cerebral arteries (MCA): On the right, there is occlusion of a mid to distal right M2 branch (series 11, image 24 and series 12, image 12). VENOUS SINUSES: As permitted by contrast timing, patent. ANATOMIC VARIANTS: None Review of the MIP images confirms the above findings. CT Brain Perfusion Findings: ASPECTS: 8 CBF (<30%) Volume: 71mL Perfusion (Tmax>6.0s) volume: 48mL Mismatch Volume: 7mL Infarction Location:Right MCA territory, predominantly the right frontal operculum IMPRESSION: 1. Occlusion of a mid-distal right M2 branch, corresponding to the area of ischemia on the perfusion scan. These results were called by telephone at the time of interpretation on 03/26/2019 at 7:56 pm to Dr.ER[facility] , who verbally acknowledged these results. 2. 5 mL right MCA  territory infarction, predominantly in the right frontal operculum, with 22 mL area of ischemic penumbra. 3. No hemodynamically significant stenosis of the carotid or vertebral arteries by the NASCET criteria. Aortic Atherosclerosis (ICD10-I70.0). Electronically Signed   By: Ulyses Jarred M.D.   On: 03/26/2019 20:07   Dg Elbow Complete Left (3+view)  Result Date: 03/30/2019 CLINICAL DATA:  Generalized left arm pain. EXAM: LEFT ELBOW - COMPLETE 3+ VIEW COMPARISON:  None. FINDINGS: Bones are diffusely demineralized. No acute fracture. No subluxation or dislocation. Fat pad elevation is consistent with joint effusion. Cortical irregularity noted at the lateral epicondyle, likely due to repetitive stress injury. IMPRESSION: Joint effusion at the elbow without acute bony abnormality evident. Electronically Signed   By: Misty Stanley M.D.   On: 03/30/2019 15:19   Ct Code Stroke Cta Neck W/wo Contrast  Result Date: 03/26/2019 CLINICAL DATA:  Left-sided weakness EXAM: CT ANGIOGRAPHY HEAD  AND NECK CT PERFUSION BRAIN TECHNIQUE: Multidetector CT imaging of the head and neck was performed using the standard protocol during bolus administration of intravenous contrast. Multiplanar CT image reconstructions and MIPs were obtained to evaluate the vascular anatomy. Carotid stenosis measurements (when applicable) are obtained utilizing NASCET criteria, using the distal internal carotid diameter as the denominator. Multiphase CT imaging of the brain was performed following IV bolus contrast injection. Subsequent parametric perfusion maps were calculated using RAPID software. CONTRAST:  149mL OMNIPAQUE IOHEXOL 350 MG/ML SOLN COMPARISON:  Head CT 03/26/2019 FINDINGS: CTA NECK FINDINGS SKELETON: Multilevel degenerative disc disease and facet hypertrophy without bony spinal canal stenosis. OTHER NECK: Normal pharynx, larynx and major salivary glands. No cervical lymphadenopathy. Unremarkable thyroid gland. UPPER CHEST: No  pneumothorax or pleural effusion. No nodules or masses. AORTIC ARCH: There is mild calcific atherosclerosis of the aortic arch. There is no aneurysm, dissection or hemodynamically significant stenosis of the visualized ascending aorta and aortic arch. Normal variant aortic arch branching pattern with the brachiocephalic and left common carotid arteries sharing a common origin. The visualized proximal subclavian arteries are widely patent. RIGHT CAROTID SYSTEM: --Common carotid artery: Widely patent origin without common carotid artery dissection or aneurysm. --Internal carotid artery: No dissection, occlusion or aneurysm. Mild atherosclerotic calcification at the carotid bifurcation without hemodynamically significant stenosis. --External carotid artery: No acute abnormality. LEFT CAROTID SYSTEM: --Common carotid artery: Widely patent origin without common carotid artery dissection or aneurysm. --Internal carotid artery: No dissection, occlusion or aneurysm. Mild atherosclerotic calcification at the carotid bifurcation without hemodynamically significant stenosis. --External carotid artery: No acute abnormality. VERTEBRAL ARTERIES: Left dominant configuration. Both origins are clearly patent. No dissection, occlusion or flow-limiting stenosis to the skull base (V1-V3 segments). CTA HEAD FINDINGS POSTERIOR CIRCULATION: --Vertebral arteries: The right vertebral artery terminates in PICA. Left vertebral artery V4 segment is normal. --Posterior inferior cerebellar arteries (PICA): Normal --Anterior inferior cerebellar arteries (AICA): Patent origins from the basilar artery. --Basilar artery: Normal. --Superior cerebellar arteries: Normal. --Posterior cerebral arteries: Normal. The right PCA is predominantly supplied by the posterior communicating artery. ANTERIOR CIRCULATION: --Intracranial internal carotid arteries: Normal. --Anterior cerebral arteries (ACA): Normal. Both A1 segments are present. Patent anterior  communicating artery (a-comm). --Middle cerebral arteries (MCA): On the right, there is occlusion of a mid to distal right M2 branch (series 11, image 24 and series 12, image 12). VENOUS SINUSES: As permitted by contrast timing, patent. ANATOMIC VARIANTS: None Review of the MIP images confirms the above findings. CT Brain Perfusion Findings: ASPECTS: 8 CBF (<30%) Volume: 39mL Perfusion (Tmax>6.0s) volume: 31mL Mismatch Volume: 22mL Infarction Location:Right MCA territory, predominantly the right frontal operculum IMPRESSION: 1. Occlusion of a mid-distal right M2 branch, corresponding to the area of ischemia on the perfusion scan. These results were called by telephone at the time of interpretation on 03/26/2019 at 7:56 pm to Dr.ERIC [facility] , who verbally acknowledged these results. 2. 5 mL right MCA territory infarction, predominantly in the right frontal operculum, with 22 mL area of ischemic penumbra. 3. No hemodynamically significant stenosis of the carotid or vertebral arteries by the NASCET criteria. Aortic Atherosclerosis (ICD10-I70.0). Electronically Signed   By: Ulyses Jarred M.D.   On: 03/26/2019 20:07   Ct Cervical Spine Wo Contrast  Result Date: 03/27/2019 CLINICAL DATA:  Fall and acute stroke EXAM: CT CERVICAL SPINE WITHOUT CONTRAST TECHNIQUE: Multidetector CT imaging of the cervical spine was performed without intravenous contrast. Multiplanar CT image reconstructions were also generated. COMPARISON:  None. FINDINGS: Alignment: No static subluxation. Facets  are aligned. Occipital condyles and the lateral masses of C1 and C2 are normally approximated. Skull base and vertebrae: No acute fracture. Soft tissues and spinal canal: No prevertebral fluid or swelling. No visible canal hematoma. Disc levels: No advanced spinal canal or neural foraminal stenosis. Marked hypertrophy of the C1-2 level. Disc space narrowing is greatest at C5-6. Multilevel facet hypertrophy. Upper chest: No pneumothorax, pulmonary  nodule or pleural effusion. Other: Normal visualized paraspinal cervical soft tissues. IMPRESSION: No acute fracture or static subluxation of the cervical spine. Electronically Signed   By: Ulyses Jarred M.D.   On: 03/27/2019 01:32   Mr Brain Wo Contrast  Result Date: 03/27/2019 CLINICAL DATA:  Slurred speech with left-sided facial droop EXAM: MRI HEAD WITHOUT CONTRAST TECHNIQUE: Multiplanar, multiecho pulse sequences of the brain and surrounding structures were obtained without intravenous contrast. COMPARISON:  Head CT 03/26/2019 FINDINGS: BRAIN: Multifocal acute ischemia throughout the right MCA territory, greatest along the right precentral gyrus and within the inferior lateral right frontal lobe. Moderate ischemia the right basal ganglia. Multifocal white matter hyperintensity, most commonly due to chronic ischemic microangiopathy. The cerebral and cerebellar volume are age-appropriate. There is no hydrocephalus. The midline structures are normal. No acute hemorrhage. VASCULAR: The major intracranial arterial and venous sinus flow voids are normal. Susceptibility-sensitive sequences show no chronic microhemorrhage or superficial siderosis. SKULL AND UPPER CERVICAL SPINE: Calvarial bone marrow signal is normal. There is no skull base mass. The visualized upper cervical spine and soft tissues are normal. SINUSES/ORBITS: There are no fluid levels or advanced mucosal thickening. The mastoid air cells and middle ear cavities are free of fluid. The orbits are normal. IMPRESSION: Multifocal ischemic infarction of the right MCA territory without acute hemorrhage or mass effect. Electronically Signed   By: Ulyses Jarred M.D.   On: 03/27/2019 02:09   Ct Code Stroke Cta Cerebral Perfusion W/wo Contrast  Result Date: 03/26/2019 CLINICAL DATA:  Left-sided weakness EXAM: CT ANGIOGRAPHY HEAD AND NECK CT PERFUSION BRAIN TECHNIQUE: Multidetector CT imaging of the head and neck was performed using the standard protocol  during bolus administration of intravenous contrast. Multiplanar CT image reconstructions and MIPs were obtained to evaluate the vascular anatomy. Carotid stenosis measurements (when applicable) are obtained utilizing NASCET criteria, using the distal internal carotid diameter as the denominator. Multiphase CT imaging of the brain was performed following IV bolus contrast injection. Subsequent parametric perfusion maps were calculated using RAPID software. CONTRAST:  152mL OMNIPAQUE IOHEXOL 350 MG/ML SOLN COMPARISON:  Head CT 03/26/2019 FINDINGS: CTA NECK FINDINGS SKELETON: Multilevel degenerative disc disease and facet hypertrophy without bony spinal canal stenosis. OTHER NECK: Normal pharynx, larynx and major salivary glands. No cervical lymphadenopathy. Unremarkable thyroid gland. UPPER CHEST: No pneumothorax or pleural effusion. No nodules or masses. AORTIC ARCH: There is mild calcific atherosclerosis of the aortic arch. There is no aneurysm, dissection or hemodynamically significant stenosis of the visualized ascending aorta and aortic arch. Normal variant aortic arch branching pattern with the brachiocephalic and left common carotid arteries sharing a common origin. The visualized proximal subclavian arteries are widely patent. RIGHT CAROTID SYSTEM: --Common carotid artery: Widely patent origin without common carotid artery dissection or aneurysm. --Internal carotid artery: No dissection, occlusion or aneurysm. Mild atherosclerotic calcification at the carotid bifurcation without hemodynamically significant stenosis. --External carotid artery: No acute abnormality. LEFT CAROTID SYSTEM: --Common carotid artery: Widely patent origin without common carotid artery dissection or aneurysm. --Internal carotid artery: No dissection, occlusion or aneurysm. Mild atherosclerotic calcification at the carotid bifurcation without hemodynamically  significant stenosis. --External carotid artery: No acute abnormality.  VERTEBRAL ARTERIES: Left dominant configuration. Both origins are clearly patent. No dissection, occlusion or flow-limiting stenosis to the skull base (V1-V3 segments). CTA HEAD FINDINGS POSTERIOR CIRCULATION: --Vertebral arteries: The right vertebral artery terminates in PICA. Left vertebral artery V4 segment is normal. --Posterior inferior cerebellar arteries (PICA): Normal --Anterior inferior cerebellar arteries (AICA): Patent origins from the basilar artery. --Basilar artery: Normal. --Superior cerebellar arteries: Normal. --Posterior cerebral arteries: Normal. The right PCA is predominantly supplied by the posterior communicating artery. ANTERIOR CIRCULATION: --Intracranial internal carotid arteries: Normal. --Anterior cerebral arteries (ACA): Normal. Both A1 segments are present. Patent anterior communicating artery (a-comm). --Middle cerebral arteries (MCA): On the right, there is occlusion of a mid to distal right M2 branch (series 11, image 24 and series 12, image 12). VENOUS SINUSES: As permitted by contrast timing, patent. ANATOMIC VARIANTS: None Review of the MIP images confirms the above findings. CT Brain Perfusion Findings: ASPECTS: 8 CBF (<30%) Volume: 54mL Perfusion (Tmax>6.0s) volume: 30mL Mismatch Volume: 16mL Infarction Location:Right MCA territory, predominantly the right frontal operculum IMPRESSION: 1. Occlusion of a mid-distal right M2 branch, corresponding to the area of ischemia on the perfusion scan. These results were called by telephone at the time of interpretation on 03/26/2019 at 7:56 pm to Dr.ERIC [facility] , who verbally acknowledged these results. 2. 5 mL right MCA territory infarction, predominantly in the right frontal operculum, with 22 mL area of ischemic penumbra. 3. No hemodynamically significant stenosis of the carotid or vertebral arteries by the NASCET criteria. Aortic Atherosclerosis (ICD10-I70.0). Electronically Signed   By: Ulyses Jarred M.D.   On: 03/26/2019 20:07   Dg  Chest Port 1 View  Result Date: 03/31/2019 CLINICAL DATA:  Follow-up pneumonia EXAM: PORTABLE CHEST 1 VIEW COMPARISON:  03/29/2019 FINDINGS: Cardiac shadow is mildly prominent but stable. Aortic calcifications are again seen. Improved aeration is noted in the left lung base with some residual atelectasis noted. No focal confluent infiltrate is seen. No acute bony abnormality is noted. IMPRESSION: Improved aeration in the left base with residual mild atelectasis noted. Electronically Signed   By: Inez Catalina M.D.   On: 03/31/2019 20:01   Dg Chest Port 1 View  Result Date: 03/29/2019 CLINICAL DATA:  Respiratory failure. EXAM: PORTABLE CHEST 1 VIEW COMPARISON:  03/28/2019 FINDINGS: Patient is rotated to the left. Lungs are adequately inflated with minimal hazy left base density which may be due to small effusion/atelectasis. Cardiomediastinal silhouette and remainder of the exam is unchanged. IMPRESSION: Minimal hazy left base opacification which may be due to small effusion/atelectasis. Electronically Signed   By: Marin Olp M.D.   On: 03/29/2019 10:09   Dg Chest Port 1 View  Result Date: 03/28/2019 CLINICAL DATA:  Abnormal respirations.  History of stroke. EXAM: PORTABLE CHEST 1 VIEW COMPARISON:  Single-view of the chest 03/26/2019. FINDINGS: Endotracheal tube has been removed. Mild right basilar atelectasis is seen. Lungs are otherwise clear. Cardiomegaly. Atherosclerosis. No pneumothorax or pleural effusion. IMPRESSION: Status post extubation. Cardiomegaly without acute disease. Right basilar atelectasis noted. Electronically Signed   By: Inge Rise M.D.   On: 03/28/2019 08:51   Dg Chest Port 1 View  Result Date: 03/26/2019 CLINICAL DATA:  Stroke EXAM: PORTABLE CHEST 1 VIEW COMPARISON:  None. FINDINGS: Endotracheal tube is 4 cm above the carina. NG tube is in the stomach. Heart is normal size. Increased markings throughout the lungs, likely chronic/scarring. No confluent opacities or  effusions. IMPRESSION: Chronic changes.  No acute cardiopulmonary disease.  Electronically Signed   By: Rolm Baptise M.D.   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 Electronically Signed   By: Margarette Canada M.D.   On: 03/30/2019 15:11   Result Date: 03/30/2019 CLINICAL DATA:  Generalized LEFT shoulder pain.  Limited mobility. EXAM: LEFT SHOULDER - 2+ VIEW COMPARISON:  None. FINDINGS: High-riding humeral head and MEDIAL position of the humeral head is unchanged from prior radiographs. Moderate to severe degenerative changes at the glenohumeral joint noted. No acute fracture noted. No suspicious focal bony lesions present. IMPRESSION: Unchanged high-riding and MEDIAL position of the humeral head with moderate to severe degenerative changes. It is difficult to determine if the humeral head is located on this study, but not acute. Electronically Signed: By: Margarette Canada M.D. On: 03/30/2019 14:59   Dg Swallowing Func-speech Pathology  Result Date: 03/28/2019 Objective Swallowing Evaluation: Type of Study: MBS-Modified Barium Swallow Study  Patient Details Name: Jann Dewan MRN: [id number] Date of Birth: 1930-10-07 Today's Date: 03/28/2019 Time: SLP Start Time (ACUTE ONLY): [id number] -SLP Stop Time (ACUTE ONLY): 1323 SLP Time Calculation (min) (ACUTE ONLY): 20 min Past Medical History: No past medical history on file. Past Surgical History: Past Surgical History: Procedure Laterality Date . RADIOLOGY WITH ANESTHESIA Left 03/26/2019  Procedure: RADIOLOGY WITH ANESTHESIA;  Surgeon: Luanne Bras, MD;  Location: [place];  Service: Radiology;  Laterality: Left; HPI: Pt is an 85 y.o. female who lives independently and presented to teh ED secondary to fall from a standing position, slurred speech, left sided facial droop, right gaze preference and left arm  weakness. MRI of the brain showed multifocal ischemic infarction of the right MCA territory without acute hemorrhage or mass effect. Intubated 9/9-9/10.  No data recorded Assessment / Plan / Recommendation CHL IP CLINICAL IMPRESSIONS 03/28/2019 Clinical Impression Mild-moderate oropharyngeal dysphagia with decreased oral cohesion, laryngeal penetration and residual. Control and propulsion of boluses was challenging for pt and she used head extention to assist in bolus transfer to oropharynx. Anterior leakage on left side and lingual residue spilling into pharynx, laryngeal vestibule reaching vocal cords without adequate sensation to expel. Nectar thick consistently penetrated due to inadequate timing and incomplete epiglottic inversion. Cues for cough were ineffective to remove. Vallecular residue ranged mild to max and mild in pyriform sinuses. Of note pt was sleepy this afternoon in comparison to bedside assessment. Dys 1 (puree), honey thick recommended, with assist, two swallows and pills whole in puree. Son educated in room after study and voiced understanding of recommendations.       SLP Visit Diagnosis Dysphagia, oropharyngeal phase (R13.12) Attention and concentration deficit following -- Frontal lobe and executive function deficit following -- Impact on safety and function Moderate aspiration risk   CHL IP TREATMENT RECOMMENDATION 03/28/2019 Treatment Recommendations Therapy as outlined in treatment plan below   Prognosis 03/28/2019 Prognosis for Safe Diet Advancement Good Barriers to Reach Goals -- Barriers/Prognosis Comment -- CHL IP DIET RECOMMENDATION 03/28/2019 SLP Diet Recommendations Dysphagia 1 (Puree) solids;Honey thick liquids Liquid Administration via Cup Medication Administration Whole meds with puree Compensations Slow rate;Small sips/bites;Multiple dry swallows after each bite/sip;Lingual sweep for clearance of pocketing Postural Changes Seated upright at 90 degrees   CHL IP OTHER RECOMMENDATIONS  03/28/2019 Recommended Consults -- Oral Care Recommendations Oral care BID Other Recommendations --   CHL IP FOLLOW UP RECOMMENDATIONS 03/28/2019 Follow up Recommendations  24 hour supervision/assistance   CHL IP FREQUENCY AND DURATION 03/28/2019 Speech Therapy Frequency (ACUTE ONLY) min 2x/week Treatment Duration 2 weeks      CHL IP ORAL PHASE 03/28/2019 Oral Phase Impaired Oral - Pudding Teaspoon -- Oral - Pudding Cup -- Oral - Honey Teaspoon Left anterior bolus loss;Weak lingual manipulation;Reduced posterior propulsion;Delayed oral transit Oral - Honey Cup Left anterior bolus loss;Weak lingual manipulation;Reduced posterior propulsion;Delayed oral transit Oral - Nectar Teaspoon -- Oral - Nectar Cup Lingual/palatal residue;Weak lingual manipulation;Left anterior bolus loss;Reduced posterior propulsion;Decreased bolus cohesion;Delayed oral transit Oral - Nectar Straw -- Oral - Thin Teaspoon -- Oral - Thin Cup -- Oral - Thin Straw -- Oral - Puree Decreased bolus cohesion;Delayed oral transit;Reduced posterior propulsion Oral - Mech Soft -- Oral - Regular -- Oral - Multi-Consistency -- Oral - Pill -- Oral Phase - Comment --  CHL IP PHARYNGEAL PHASE 03/28/2019 Pharyngeal Phase Impaired Pharyngeal- Pudding Teaspoon -- Pharyngeal -- Pharyngeal- Pudding Cup -- Pharyngeal -- Pharyngeal- Honey Teaspoon Pharyngeal residue - valleculae;Pharyngeal residue - pyriform Pharyngeal -- Pharyngeal- Honey Cup Penetration/Aspiration during swallow;Pharyngeal residue - valleculae;Pharyngeal residue - pyriform Pharyngeal Material enters airway, remains ABOVE vocal cords and not ejected out Pharyngeal- Nectar Teaspoon -- Pharyngeal -- Pharyngeal- Nectar Cup Pharyngeal residue - pyriform;Pharyngeal residue - valleculae;Penetration/Aspiration before swallow Pharyngeal Material enters airway, remains ABOVE vocal cords then ejected out Pharyngeal- Nectar Straw -- Pharyngeal -- Pharyngeal- Thin Teaspoon -- Pharyngeal -- Pharyngeal- Thin Cup --  Pharyngeal -- Pharyngeal- Thin Straw -- Pharyngeal -- Pharyngeal- Puree Pharyngeal residue - valleculae;Delayed swallow initiation-pyriform sinuses Pharyngeal -- Pharyngeal- Mechanical Soft -- Pharyngeal -- Pharyngeal- Regular -- Pharyngeal -- Pharyngeal- Multi-consistency -- Pharyngeal -- Pharyngeal- Pill -- Pharyngeal -- Pharyngeal Comment --  CHL IP CERVICAL ESOPHAGEAL PHASE 03/28/2019 Cervical Esophageal Phase WFL Pudding Teaspoon -- Pudding Cup -- Honey Teaspoon -- Honey Cup -- Nectar Teaspoon -- Nectar Cup -- Nectar Straw -- Thin Teaspoon -- Thin Cup -- Thin Straw -- Puree -- Mechanical Soft -- Regular -- Multi-consistency -- Pill -- Cervical Esophageal Comment -- [place] Siren 03/28/2019, 3:11 PM Orbie Pyo Litaker M.Ed Actor Pager [phone] Office [phone]              Ct Head Code Stroke Wo Contrast  Result Date: 03/26/2019 CLINICAL DATA:  Code stroke.  Slurred speech EXAM: CT HEAD WITHOUT CONTRAST TECHNIQUE: Contiguous axial images were obtained from the base of the skull through the vertex without intravenous contrast. COMPARISON:  None. FINDINGS: Brain: There is no mass, hemorrhage or extra-axial collection. The size and configuration of the ventricles and extra-axial CSF spaces are normal. There is hypoattenuation of the periventricular white matter, most commonly indicating chronic ischemic microangiopathy. Vascular: No abnormal hyperdensity of the major intracranial arteries or dural venous sinuses. No intracranial atherosclerosis. Skull: The visualized skull base, calvarium and extracranial soft tissues are normal. Sinuses/Orbits: No fluid levels or advanced mucosal thickening of the visualized paranasal sinuses. No mastoid or middle ear effusion. The orbits are normal. ASPECTS [facility] Stroke Program Early CT Score) - Ganglionic level infarction (caudate, lentiform nuclei, internal capsule, insula, M1-M3 cortex): 5 - Supraganglionic infarction (M4-M6 cortex): 3  Total score (0-10 with 10 being normal): 8 IMPRESSION: 1. No intracranial hemorrhage. 2. ASPECTS is 8 These results were called by telephone at the time of interpretation on 03/26/2019 at 7:46 pm to provider Dr. Kerney Elbe, who verbally acknowledged these results. Electronically Signed   By: Ulyses Jarred M.D.   On: 03/26/2019 19:48   Vas [place] Lower  Extremity Venous (dvt)  Result Date: 03/27/2019  Lower Venous Study Indications: Stroke.  Comparison Study: No priors. Performing Technologist: Oda Cogan RDMS, RVT  Examination Guidelines: A complete evaluation includes B-mode imaging, spectral Doppler, color Doppler, and power Doppler as needed of all accessible portions of each vessel. Bilateral testing is considered an integral part of a complete examination. Limited examinations for reoccurring indications may be performed as noted.  +---------+---------------+---------+-----------+----------+--------------+ RIGHT    CompressibilityPhasicitySpontaneityPropertiesThrombus Aging +---------+---------------+---------+-----------+----------+--------------+ CFV      Full           Yes      Yes                                 +---------+---------------+---------+-----------+----------+--------------+ SFJ      Full                                                        +---------+---------------+---------+-----------+----------+--------------+ FV Prox  Full                                                        +---------+---------------+---------+-----------+----------+--------------+ FV Mid   Full                                                        +---------+---------------+---------+-----------+----------+--------------+ FV DistalFull                                                        +---------+---------------+---------+-----------+----------+--------------+ PFV      Full                                                         +---------+---------------+---------+-----------+----------+--------------+ POP      Full           Yes      Yes                                 +---------+---------------+---------+-----------+----------+--------------+ PTV      Full                                                        +---------+---------------+---------+-----------+----------+--------------+ PERO     Full                                                        +---------+---------------+---------+-----------+----------+--------------+   +---------+---------------+---------+-----------+----------+--------------+  LEFT     CompressibilityPhasicitySpontaneityPropertiesThrombus Aging +---------+---------------+---------+-----------+----------+--------------+ CFV      Full           Yes      Yes                                 +---------+---------------+---------+-----------+----------+--------------+ SFJ      Full                                                        +---------+---------------+---------+-----------+----------+--------------+ FV Prox  Full                                                        +---------+---------------+---------+-----------+----------+--------------+ FV Mid   Full                                                        +---------+---------------+---------+-----------+----------+--------------+ FV DistalFull                                                        +---------+---------------+---------+-----------+----------+--------------+ PFV      Full                                                        +---------+---------------+---------+-----------+----------+--------------+ POP      Full           Yes      Yes                                 +---------+---------------+---------+-----------+----------+--------------+ PTV      Full                                                         +---------+---------------+---------+-----------+----------+--------------+ PERO     Full                                                        +---------+---------------+---------+-----------+----------+--------------+     Summary: Right: There is no evidence of deep vein thrombosis in the lower extremity. No cystic structure found in the popliteal fossa. Left: There is no evidence of deep vein thrombosis in the lower extremity. A cystic structure is found in the popliteal  fossa.  *See table(s) above for measurements and observations. Electronically signed by Monica Martinez MD on 03/27/2019 at 3:20:28 PM.    Final    Ir Angio Intra Extracran Sel Com Carotid Innominate Uni R Mod Sed  Result Date: 03/31/2019 CLINICAL DATA:  Acute onset of left-sided neglect. Left-sided weakness. Suspected occlusion of the superior division of the right middle cerebral artery M2 mid to distal region. EXAM: IR ANGIO VERTEBRAL SEL SUBCLAVIAN INNOMINATE UNI RIGHT MOD SED; IR ANGIO INTRA EXTRACRAN SEL COM CAROTID INNOMINATE UNI RIGHT MOD SED COMPARISON:  CT angiogram of the head and neck of 03/26/2019. MEDICATIONS: Heparin 0 units IV; 2 g Ancef antibiotic was administered within 1 hour of the procedure. ANESTHESIA/SEDATION: General anesthesia. CONTRAST:  Isovue 300 approximately 30 cc. FLUOROSCOPY TIME:  Fluoroscopy Time: 2 minutes 42 seconds (242 mGy). COMPLICATIONS: None immediate. TECHNIQUE: Informed written consent was obtained from the patient's son after a thorough discussion of the procedural risks, benefits and alternatives. All questions were addressed. Maximal Sterile Barrier Technique was utilized including caps, mask, sterile gowns, sterile gloves, sterile drape, hand hygiene and skin antiseptic. A timeout was performed prior to the initiation of the procedure. The right groin was prepped and draped in the usual sterile fashion. Thereafter using modified Seldinger technique, transfemoral access into the right  common femoral artery was obtained without difficulty. Over a 0.035 inch guidewire, a 5 French Pinnacle sheath was inserted. Through this, and also over 0.035 inch guidewire, a 5 [place] JB 1 catheter was advanced to the aortic arch region and selectively positioned in the innominate artery and the right common carotid artery. FINDINGS: The innominate artery angiogram demonstrates the origins of the right common carotid artery and the right subclavian artery to be widely patent. The visualized portion of the right vertebral artery at its origin and distally demonstrates wide patency. The vessel is seen to opacify to the cranial skull base. The right common carotid arteriogram demonstrates the right external carotid artery and its major branches to be widely patent. The right internal carotid artery at the bulb to the cranial skull base demonstrates wide patency. Petrous, cavernous and the supraclinoid segments are widely patent. A right posterior communicating artery is seen opacifying the right posterior cerebral artery distribution. The right middle cerebral artery, and the right anterior cerebral artery opacify into the capillary and venous phases. The mid to delayed arterial phase demonstrates angiographic occlusion of an anterior perisylvian branch of the superior division of the right middle cerebral artery in the distal M2 M3 junction. 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 Given the size of this vessel distally endovascular revascularization is felt to be too risky given the availability of the present stent retrievers. PLAN: As per referring MD. Electronically Signed   By: Luanne Bras M.D.   On: 03/27/2019 09:43   Ir Angio Vertebral Sel Subclavian Innominate Uni R Mod Sed  Result Date: 03/31/2019 CLINICAL DATA:  Acute onset of left-sided neglect. Left-sided weakness. Suspected occlusion  of the superior division of the right middle cerebral artery M2 mid to distal region. EXAM: IR ANGIO VERTEBRAL SEL SUBCLAVIAN INNOMINATE UNI RIGHT MOD SED; IR ANGIO INTRA EXTRACRAN SEL COM CAROTID INNOMINATE UNI RIGHT MOD SED COMPARISON:  CT angiogram of the head and neck of 03/26/2019. MEDICATIONS: Heparin 0 units IV; 2 g Ancef antibiotic was administered within 1 hour of the procedure. ANESTHESIA/SEDATION: General anesthesia. CONTRAST:  Isovue 300  approximately 30 cc. FLUOROSCOPY TIME:  Fluoroscopy Time: 2 minutes 42 seconds (242 mGy). COMPLICATIONS: None immediate. TECHNIQUE: Informed written consent was obtained from the patient's son after a thorough discussion of the procedural risks, benefits and alternatives. All questions were addressed. Maximal Sterile Barrier Technique was utilized including caps, mask, sterile gowns, sterile gloves, sterile drape, hand hygiene and skin antiseptic. A timeout was performed prior to the initiation of the procedure. The right groin was prepped and draped in the usual sterile fashion. Thereafter using modified Seldinger technique, transfemoral access into the right common femoral artery was obtained without difficulty. Over a 0.035 inch guidewire, a 5 French Pinnacle sheath was inserted. Through this, and also over 0.035 inch guidewire, a 5 [place] JB 1 catheter was advanced to the aortic arch region and selectively positioned in the innominate artery and the right common carotid artery. FINDINGS: The innominate artery angiogram demonstrates the origins of the right common carotid artery and the right subclavian artery to be widely patent. The visualized portion of the right vertebral artery at its origin and distally demonstrates wide patency. The vessel is seen to opacify to the cranial skull base. The right common carotid arteriogram demonstrates the right external carotid artery and its major branches to be widely patent. The right internal carotid artery at the bulb to  the cranial skull base demonstrates wide patency. Petrous, cavernous and the supraclinoid segments are widely patent. A right posterior communicating artery is seen opacifying the right posterior cerebral artery distribution. The right middle cerebral artery, and the right anterior cerebral artery opacify into the capillary and venous phases. The mid to delayed arterial phase demonstrates angiographic occlusion of an anterior perisylvian branch of the superior division of the right middle cerebral artery in the distal M2 M3 junction. 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 Given the size of this vessel distally endovascular revascularization is felt to be too risky given the availability of the present stent retrievers. PLAN: As per referring MD. Electronically Signed   By: Luanne Bras M.D.   On: 03/27/2019 09:43    Cerebral Angiogram 03/27/2019 Occlusion of RT MCA superior division ant peri sylvian branch M2/M3 junction.   2D Echocardiogram 1. The left ventricle has hyperdynamic systolic function, with an ejection fraction of >65%. The cavity size was normal. Left ventricular diastolic Doppler parameters are consistent with impaired relaxation. Elevated left atrial and left ventricular end-diastolic pressures.  2. The right ventricle has normal systolic function. The cavity was normal. There is no increase in right ventricular wall thickness. Right ventricular systolic pressure is normal with an estimated pressure of 27.8 mmHg.  3. Tricuspid valve regurgitation is moderate.  4. The aortic valve is tricuspid. Severely thickening of the aortic valve. Severe calcifcation of the aortic valve. Moderate stenosis of the aortic valve with peak/mean gradients 36/17 mmHg.  5. The aorta is normal unless otherwise noted.   HISTORY OF PRESENT ILLNESS Virgilene Loura is an 85 y.o. female who lives  in a townhome independently with an mRS of 0. She lives alone and was LKW per one report at 1100 on Tuesday 03/25/2019. Her son, who is the only informant present, states that he spoke with her on the phone at about 9 PM last night and that she was conversing normally with no complaints. The patient tells team that at 1100 today 03/26/2019 she fell from a standing position. Her son later came  by her home at 5 PM and noted that she had slurred speech, left sided facial droop, right gaze preference and left arm weakness. EMS was called and she was brought to the ED emergently. Code Stroke was called in the ED. NIHSS of 7.   HOSPITAL COURSE Ms. Renezmee Climer is a 85 y.o. female with history of HTN, CKD, AV stenosis, PSVT/PVCs, hypothyroidism and peripheral neuropathy presenting following a fall with slurred speech, left sided facial droop, right gazepreferenceand left arm weakness.   Stroke:  R MCA infarct due to right distal M2 occlusion, embolic secondary to PAF   Code Stroke CT head No acute abnormality. ASPECTS 8    CTA head & neck occlusion mid-distal R M2 branch. No significant stenosis  CT perfusion 2mL R MCA territory infarct R frontal operculum w. 21mL penumbra  Cerebral angio occlusion R MCA M2/M3 jxn  CT CSpine no fx, subluxation  MRI  Multifocal R MCA territory infarct w/o hemorrhage or mass effect  2D Echo EF >65%. No source of embolus   LE venous doppler no DVT  LDL 80  HgbA1c 5.5  No antithrombotic prior to admission, now eliquis 2.5 bid   Therapy recommendations:  SNF   Disposition:  SNF  PAF with RVR Hx of SVT  Reported on cardizem in the past  PAF with RVR on tele in ICU  Resumed cardizam after passing swallow -> Cardizem CD 180 mg daily started 03/28/19   Treated with ASA and plavix in the hospital  Changed to Eliquis for d/c  Possible UTI and transient Hypotension  Low BP resolved  Continue to encourage po intake  Started on Rocephin; change to  keflex 500 bid at d/c for total of 3 days abx  UCx. Ordered 9/14 - not yet collected   Hypertension  Treated w/ Cleviprex post angio  BP now Stable  On home cardizem 180   Transient hypotension w/ sleep  Long-term BP goal normotensive  Hyperlipidemia  Home meds:  No statin  LDL 80, goal < 70  Add lipitor 20   Continue statin at discharge  Dysphagia Malnutrition  Secondary to stroke  On puree diet now  Speech on board  Resume home meds  MBS with speech - cleared for D1 honey thick liquid diet  Magic cup   Other Stroke Risk Factors  Advanced age  Family hx stroke   Non-rheumatic AV stenosis   Other Active Problems  Fall - CK ok  UA neg, UCx - 03/27/19 - no growth (final)  Trop 69  Hypothyroid, TSH ok. On synthroid.  Osteoporosis on denosumab as OP. C-Spine cleared.   Leukocytosis 16.1->15.8->14.6 (afebrile)->12.4  Chest x-ray - 03/29/2019 - Minimal hazy left base opacification which may be due to small effusion/atelectasis.  Left Shoulder X-ray - L humeral head chronically dislocated   Left Elbow X-ray - joint effusion w/p acute bony abnormality   Patient placed back on Celebrex for pain in hospital. She understands it increases her bleeding risk on DOAC. Is agreeable to continue.   GERD on PPI  Constipation, had 2 BMs in the 24h prior to d/c  DISCHARGE EXAM Blood pressure 140/87, pulse 75, temperature 98.2 F (36.8 C), temperature source Oral, resp. rate 20, height 5\' 2"  (1.575 m), weight 52 kg, SpO2 100 %. General - Well nourished, well developed elderly Caucasian lady, no apparent distress.  Ophthalmologic - fundi not visualized due to noncooperation.  Cardiovascular - Regular rate and rhythm.  Neuro -AAO x3, following all simple commands,  no aphasia, mild dysarthria, able to name and repeat. With eye opening, eyes in slight right gaze position, left gaze incomplete, visual field full but has left simutagnosia, PERRL.  Left  facial droop.  Tongue protrusion midline. RUE 4+/5 and RLE 4+/5. LUE 3/5 and LLE 4/5. DTR 1+ and no babinski. Sensation symmetrical, coordination R FTN intact and gait not tested.  Discharge Diet   Dysphagia 1 honey thick liquids  DISCHARGE PLAN  Disposition:  Skilled nursing facility for ongoing PT, OT and ST.   Eliquis (apixaban) daily for secondary stroke prevention   Ongoing stroke risk factor control by Primary Care Physician at time of discharge  Follow-up System, Pcp Not In in 2 weeks.  Follow-up in [facility] in 4 weeks, office to schedule an appointment.   35 minutes were spent preparing discharge.  Burnetta Sabin, MSN, APRN, ANVP-BC, AGPCNP-BC Advanced Practice Stroke Nurse [place] for Schedule & Pager information 04/02/2019 1:08 PM   I have personally obtained history,examined this patient, reviewed notes, independently viewed imaging studies, participated in medical decision making and plan of care.ROS completed by me personally and pertinent positives fully documented  I have made any additions or clarifications directly to the above note. Agree with note above   Antony Contras, MD Medical Director [facility] Pager: [phone] 04/02/2019 1:08 PM

## 2019-04-01 DIAGNOSIS — I10 Essential (primary) hypertension: Secondary | ICD-10-CM

## 2019-04-01 LAB — BASIC METABOLIC PANEL
Anion gap: 11 (ref 5–15)
BUN: 19 mg/dL (ref 8–23)
CO2: 20 mmol/L — ABNORMAL LOW (ref 22–32)
Calcium: 7.7 mg/dL — ABNORMAL LOW (ref 8.9–10.3)
Chloride: 106 mmol/L (ref 98–111)
Creatinine, Ser: 0.78 mg/dL (ref 0.44–1.00)
GFR calc Af Amer: >60 mL/min (ref >60)
GFR calc non Af Amer: >60 mL/min (ref >60)
Glucose, Bld: 100 mg/dL — ABNORMAL HIGH (ref 70–99)
Potassium: 3.9 mmol/L (ref 3.5–5.1)
Sodium: 137 mmol/L (ref 135–145)

## 2019-04-01 LAB — GLUCOSE, CAPILLARY
Glucose-Capillary: 96 mg/dL (ref 70–99)
Glucose-Capillary: 87 mg/dL (ref 70–99)
Glucose-Capillary: 147 mg/dL — ABNORMAL HIGH (ref 70–99)
Glucose-Capillary: 118 mg/dL — ABNORMAL HIGH (ref 70–99)

## 2019-04-01 LAB — URINALYSIS, ROUTINE W REFLEX MICROSCOPIC
Bilirubin Urine: NEGATIVE
Glucose, UA: NEGATIVE mg/dL
Ketones, ur: NEGATIVE mg/dL
Nitrite: POSITIVE — AB
Protein, ur: 30 mg/dL — AB
Specific Gravity, Urine: 1.02 (ref 1.005–1.030)
WBC, UA: >50 WBC/hpf — ABNORMAL HIGH (ref 0–5)
pH: 5 (ref 5.0–8.0)

## 2019-04-01 LAB — CBC
HCT: 35.8 % — ABNORMAL LOW (ref 36.0–46.0)
Hemoglobin: 11.8 g/dL — ABNORMAL LOW (ref 12.0–15.0)
MCH: 30.4 pg (ref 26.0–34.0)
MCHC: 33 g/dL (ref 30.0–36.0)
MCV: 92.3 fL (ref 80.0–100.0)
Platelets: 239 10*3/uL (ref 150–400)
RBC: 3.88 MIL/uL (ref 3.87–5.11)
RDW: 13.2 % (ref 11.5–15.5)
WBC: 13.3 10*3/uL — ABNORMAL HIGH (ref 4.0–10.5)
nRBC: 0 % (ref 0.0–0.2)

## 2019-04-01 LAB — TSH: TSH: 4.157 u[IU]/mL (ref 0.350–4.500)

## 2019-04-01 MED ORDER — VITAMIN C 500 MG PO TABS
500.0000 mg | ORAL_TABLET | Freq: Every day | ORAL | Status: DC
  Administered 2019-04-01 – 2019-04-02 (×2): 500 mg via ORAL
  Filled 2019-04-01: qty 1

## 2019-04-01 MED ORDER — POLYETHYLENE GLYCOL 3350 17 G PO PACK
17.0000 g | PACK | Freq: Every day | ORAL | Status: DC
  Administered 2019-04-01 – 2019-04-02 (×2): 17 g via ORAL
  Filled 2019-04-01 (×2): qty 1

## 2019-04-01 MED ORDER — VITAMIN D (ERGOCALCIFEROL) 1.25 MG (50000 UNIT) PO CAPS
50000.0000 [IU] | ORAL_CAPSULE | ORAL | Status: DC
  Administered 2019-04-01: 50000 [IU] via ORAL
  Filled 2019-04-01: qty 1

## 2019-04-01 MED ORDER — CEPHALEXIN 500 MG PO CAPS: 500.0000 mg | ORAL_CAPSULE | Freq: Two times a day (BID) | ORAL | 0 refills | Status: AC

## 2019-04-01 MED ORDER — PANTOPRAZOLE SODIUM 40 MG PO TBEC
80.0000 mg | DELAYED_RELEASE_TABLET | Freq: Every day | ORAL | Status: DC
  Administered 2019-04-01 – 2019-04-02 (×2): 80 mg via ORAL
  Filled 2019-04-01 (×3): qty 2

## 2019-04-01 MED ORDER — TAB-A-VITE/IRON PO TABS
1.0000 | ORAL_TABLET | Freq: Every day | ORAL | Status: DC
  Administered 2019-04-01: 1 via ORAL
  Filled 2019-04-01 (×3): qty 1

## 2019-04-01 NOTE — Progress Notes (Signed)
Pt is unable to have a bowel movement after Miralax, and suppository, RN will give pt warm prune juice. [facility] will not accept pt without BM. MD notified

## 2019-04-01 NOTE — Progress Notes (Signed)
PROGRESS NOTE                                                                                                                                                                                                             Patient Demographics:    Nancy Zamora, is a 85 y.o. female, DOB - 09/22/1930, [id number]  Admit date - 03/26/2019   Admitting Physician Kerney Elbe, MD  Outpatient Primary MD for the patient is System, Pcp Not In  LOS - 6  Chief Complaint  Patient presents with   Aphasia       Brief Narrative 85 year old female with history of hypertension, GERD, hypothyroidism, SVT who was admitted for right MCA CVA by neurology, we were consulted for possible UTI and transient hypotension.   Subjective:    Nancy Zamora today has, No headache, No chest pain, No abdominal pain - No Nausea, No new weakness tingling or numbness, No Cough - SOB.  Does feel mildly constipated.   Assessment  & Plan :     1.  Right MCA CVA with mild left upper arm weakness.  Stable defer management to primary team which is neurology.  Telford Nab seems to be stable on Eliquis and statin combination.  2.  Possible UTI with transient hypotension.  Blood pressure has stabilized, patient has good oral intake, stop IV fluids which she has been getting for several days, encourage oral diet, liberalize diet as long as it is soft and being swallowed properly.  Currently on Rocephin empirically for UTI should get 3 days of total antibiotics.  If she needs to be discharged she can be placed on Keflex 500 twice daily complete total of 3 days of treatment.  Urine cultures pending.  3.  SVT.  Continue Cardizem at present dose.  Dose was slightly reduced for transient episodes of hypotension when she sleeps.  4.  Hypertension.  Stable on lower than home dose Cardizem continue this dose.  Discontinue ARB for now.  5.  Dysphagia.  Per speech therapy currently on dysphagia 1 diet with honey thick  liquids.  6.  GERD.  Resume home dose PPI.  7.  Hypothyroidism.  On Synthroid at home dose.  8.  Constipation.  Placed on bowel regimen.   Family Communication  : None present  Code Status : Full  Disposition Plan  : SNF, stable for discharge per hospitalist standpoint.  Consults  : Specialist team consulting for hypotension, neurology primary  Procedures  :    DVT  Prophylaxis  :  Eliquis  Lab Results  Component Value Date   PLT 239 04/01/2019    Diet :  Diet Order            DIET - DYS 1 Room service appropriate? No; Fluid consistency: Honey Thick  Diet effective now               Inpatient Medications Scheduled Meds:  apixaban  2.5 mg Oral BID   atorvastatin  20 mg Oral q1800   Chlorhexidine Gluconate Cloth  6 each Topical Daily   diltiazem  180 mg Oral Daily   insulin aspart  0-9 Units Subcutaneous Q4H   levothyroxine  112 mcg Oral Daily   polyethylene glycol  17 g Oral Daily   Continuous Infusions:  cefTRIAXone (ROCEPHIN)  IV 1 g (03/31/19 2210)   PRN Meds:.acetaminophen **OR** acetaminophen (TYLENOL) oral liquid 160 mg/5 mL **OR** acetaminophen, bisacodyl, hydrALAZINE, Resource ThickenUp Clear, senna-docusate  Antibiotics  :   Anti-infectives (From admission, onward)   Start     Dose/Rate Route Frequency Ordered Stop   03/31/19 2300  cefTRIAXone (ROCEPHIN) 1 g in sodium chloride 0.9 % 100 mL IVPB    Note to Pharmacy: Start only after urine is collected for analysis and cx   1 g 200 mL/hr over 30 Minutes Intravenous Every 24 hours 03/31/19 1937     03/26/19 2102  vancomycin (VANCOCIN) 1-5 GM/200ML-% IVPB  Status:  Discontinued    Note to Pharmacy: Novella Rob   : cabinet override      03/26/19 2102 03/26/19 2147          Objective:   Vitals:   03/31/19 2016 03/31/19 2331 04/01/19 0356 04/01/19 0753  BP: 125/71 110/69  121/69  Pulse: 95 84  72  Resp:  13    Temp: 98.7 F (37.1 C) 98.5 F (36.9 C) 98.8 F (37.1 C) 98.5 F (36.9  C)  TempSrc: Oral Axillary Oral Oral  SpO2: 98% 99%  100%  Weight:      Height:        Wt Readings from Last 3 Encounters:  03/27/19 52 kg     Intake/Output Summary (Last 24 hours) at 04/01/2019 1004 Last data filed at 04/01/2019 0409 Gross per 24 hour  Intake 722 ml  Output 1090 ml  Net -368 ml     Physical Exam  Awake Alert, Oriented X 3, No new F.N deficits, Mild L arm weakness, Normal affect .AT,PERRAL Supple Neck,No JVD, No cervical lymphadenopathy appriciated.  Symmetrical Chest wall movement, Good air movement bilaterally, CTAB RRR,No Gallops,Rubs or new Murmurs, No Parasternal Heave +ve B.Sounds, Abd Soft, No tenderness, No organomegaly appriciated, No rebound - guarding or rigidity. No Cyanosis, Clubbing or edema, No new Rash or bruise      Data Review:    CBC Recent Labs  Lab 03/26/19 1858  03/27/19 0235  03/29/19 0340 03/30/19 0410 03/31/19 0717 03/31/19 1633 04/01/19 0321  WBC 16.2*  --  16.1*   < > 14.6* 12.4* 11.5* 13.6* 13.3*  HGB 12.5   < > 13.5   < > 11.0* 11.5* 12.6 12.7 11.8*  HCT 37.4   < > 40.7   < > 33.4* 34.4* 37.9 35.9* 35.8*  PLT 190  --  202   < > 153 153 211 209 239  MCV 93.5  --  92.1   < > 93.8 92.5 91.3 90.2 92.3  MCH 31.3  --  30.5   < > 30.9 30.9  30.4 31.9 30.4  MCHC 33.4  --  33.2   < > 32.9 33.4 33.2 35.4 33.0  RDW 13.2  --  13.2   < > 13.5 13.3 13.1 13.0 13.2  LYMPHSABS 1.1  --  2.3  --   --   --   --  2.0  --   MONOABS 1.1*  --  0.8  --   --   --   --  1.7*  --   EOSABS 0.0  --  0.0  --   --   --   --  0.1  --   BASOSABS 0.1  --  0.0  --   --   --   --  0.1  --    < > = values in this interval not displayed.    Chemistries  Recent Labs  Lab 03/26/19 1858  03/26/19 2327  03/28/19 0154 03/29/19 0340 03/30/19 0410 03/31/19 0717 03/31/19 1633 04/01/19 0321  NA 134*   < >  --    < > 137 136 135 134* 133* 137  K 4.5   < >  --    < > 4.0 3.7 3.8 3.4* 3.2* 3.9  CL 100   < >  --    < > 106 105 106 101 102 106  CO2  25  --   --    < > 23 21* 21* 24 22 20*  GLUCOSE 165*   < >  --    < > 102* 89 95 104* 169* 100*  BUN 14   < >  --    < > 14 14 13 10 14 19   CREATININE 0.96   < >  --    < > 0.81 0.76 0.58 0.60 0.87 0.78  CALCIUM 9.1  --   --    < > 8.2* 8.0* 7.9* 8.1* 7.8* 7.7*  MG  --   --  2.0  --  1.9 2.1  --   --   --   --   AST 29  --   --   --   --   --   --   --   --   --   ALT 14  --   --   --   --   --   --   --   --   --   ALKPHOS 57  --   --   --   --   --   --   --   --   --   BILITOT 0.9  --   --   --   --   --   --   --   --   --    < > = values in this interval not displayed.   ------------------------------------------------------------------------------------------------------------------ No results for input(s): CHOL, HDL, LDLCALC, TRIG, CHOLHDL, LDLDIRECT in the last 72 hours.  Lab Results  Component Value Date   HGBA1C 5.5 03/27/2019   ------------------------------------------------------------------------------------------------------------------ Recent Labs    04/01/19 0727  TSH 4.157   ------------------------------------------------------------------------------------------------------------------ Recent Labs    03/31/19 2040  VITAMINB12 4,339*    Coagulation profile Recent Labs  Lab 03/26/19 1858  INR 1.0    No results for input(s): DDIMER in the last 72 hours.  Cardiac Enzymes Recent Labs  Lab 03/31/19 1633  CKMB 2.0   ------------------------------------------------------------------------------------------------------------------ No results found for: BNP  Micro Results Recent Results (from the past 240 hour(s))  SARS Coronavirus 2 [facility] order,  Performed in [facility] hospital lab) Nasopharyngeal Nasopharyngeal Swab     Status: None   Collection Time: 03/26/19  8:42 PM   Specimen: Nasopharyngeal Swab  Result Value Ref Range Status   SARS Coronavirus 2 NEGATIVE NEGATIVE Final    Comment: (NOTE) If result is NEGATIVE SARS-CoV-2 target nucleic  acids are NOT DETECTED. The SARS-CoV-2 RNA is generally detectable in upper and lower  respiratory specimens during the acute phase of infection. The lowest  concentration of SARS-CoV-2 viral copies this assay can detect is 250  copies / mL. A negative result does not preclude SARS-CoV-2 infection  and should not be used as the sole basis for treatment or other  patient management decisions.  A negative result may occur with  improper specimen collection / handling, submission of specimen other  than nasopharyngeal swab, presence of viral mutation(s) within the  areas targeted by this assay, and inadequate number of viral copies  (<250 copies / mL). A negative result must be combined with clinical  observations, patient history, and epidemiological information. If result is POSITIVE SARS-CoV-2 target nucleic acids are DETECTED. The SARS-CoV-2 RNA is generally detectable in upper and lower  respiratory specimens dur ing the acute phase of infection.  Positive  results are indicative of active infection with SARS-CoV-2.  Clinical  correlation with patient history and other diagnostic information is  necessary to determine patient infection status.  Positive results do  not rule out bacterial infection or co-infection with other viruses. If result is PRESUMPTIVE POSTIVE SARS-CoV-2 nucleic acids MAY BE PRESENT.   A presumptive positive result was obtained on the submitted specimen  and confirmed on repeat testing.  While 2019 novel coronavirus  (SARS-CoV-2) nucleic acids may be present in the submitted sample  additional confirmatory testing may be necessary for epidemiological  and / or clinical management purposes  to differentiate between  SARS-CoV-2 and other Sarbecovirus currently known to infect humans.  If clinically indicated additional testing with an alternate test  methodology [id number]) is advised. The SARS-CoV-2 RNA is generally  detectable in upper and lower respiratory  sp ecimens during the acute  phase of infection. The expected result is Negative. Fact Sheet for Patients:  StrictlyIdeas.no Fact Sheet for Healthcare Providers: BankingDealers.co.za This test is not yet approved or cleared by the [place] FDA and has been authorized for detection and/or diagnosis of SARS-CoV-2 by FDA under an Emergency Use Authorization (EUA).  This EUA will remain in effect (meaning this test can be used) for the duration of the COVID-19 declaration under Section 564(b)(1) of the Act, 21 U.S.C. section 360bbb-3(b)(1), unless the authorization is terminated or revoked sooner. Performed at [facility], [place] [address] [place], [place] [address]   MRSA PCR Screening     Status: None   Collection Time: 03/26/19 11:27 PM   Specimen: Nasopharyngeal  Result Value Ref Range Status   MRSA by PCR NEGATIVE NEGATIVE Final    Comment:        The GeneXpert MRSA Assay (FDA approved for NASAL specimens only), is one component of a comprehensive MRSA colonization surveillance program. It is not intended to diagnose MRSA infection nor to guide or monitor treatment for MRSA infections. Performed at [facility], [place] [address], [place], [place] [address]   Culture, Urine     Status: None   Collection Time: 03/27/19 12:29 AM   Specimen: Urine, Catheterized  Result Value Ref Range Status   Specimen Description URINE, CATHETERIZED  Final  Special Requests NONE  Final   Culture   Final    NO GROWTH Performed at [facility], [place] [address] [place], [place] [address]    Report Status 03/28/2019 FINAL  Final  SARS Coronavirus 2 [facility] order, Performed in [facility] hospital lab) Nasopharyngeal Nasopharyngeal Swab     Status: None   Collection Time: 03/31/19 11:53 AM   Specimen: Nasopharyngeal Swab  Result Value Ref Range Status   SARS Coronavirus 2 NEGATIVE NEGATIVE Final    Comment: (NOTE) If  result is NEGATIVE SARS-CoV-2 target nucleic acids are NOT DETECTED. The SARS-CoV-2 RNA is generally detectable in upper and lower  respiratory specimens during the acute phase of infection. The lowest  concentration of SARS-CoV-2 viral copies this assay can detect is 250  copies / mL. A negative result does not preclude SARS-CoV-2 infection  and should not be used as the sole basis for treatment or other  patient management decisions.  A negative result may occur with  improper specimen collection / handling, submission of specimen other  than nasopharyngeal swab, presence of viral mutation(s) within the  areas targeted by this assay, and inadequate number of viral copies  (<250 copies / mL). A negative result must be combined with clinical  observations, patient history, and epidemiological information. If result is POSITIVE SARS-CoV-2 target nucleic acids are DETECTED. The SARS-CoV-2 RNA is generally detectable in upper and lower  respiratory specimens dur ing the acute phase of infection.  Positive  results are indicative of active infection with SARS-CoV-2.  Clinical  correlation with patient history and other diagnostic information is  necessary to determine patient infection status.  Positive results do  not rule out bacterial infection or co-infection with other viruses. If result is PRESUMPTIVE POSTIVE SARS-CoV-2 nucleic acids MAY BE PRESENT.   A presumptive positive result was obtained on the submitted specimen  and confirmed on repeat testing.  While 2019 novel coronavirus  (SARS-CoV-2) nucleic acids may be present in the submitted sample  additional confirmatory testing may be necessary for epidemiological  and / or clinical management purposes  to differentiate between  SARS-CoV-2 and other Sarbecovirus currently known to infect humans.  If clinically indicated additional testing with an alternate test  methodology [phone]) is advised. The SARS-CoV-2 RNA is generally   detectable in upper and lower respiratory sp ecimens during the acute  phase of infection. The expected result is Negative. Fact Sheet for Patients:  StrictlyIdeas.no Fact Sheet for Healthcare Providers: BankingDealers.co.za This test is not yet approved or cleared by the [place] FDA and has been authorized for detection and/or diagnosis of SARS-CoV-2 by FDA under an Emergency Use Authorization (EUA).  This EUA will remain in effect (meaning this test can be used) for the duration of the COVID-19 declaration under Section 564(b)(1) of the Act, 21 U.S.C. section 360bbb-3(b)(1), unless the authorization is terminated or revoked sooner. Performed at [facility], [place] [address] [place], [place] [address]     Radiology Reports Ct Code Stroke Cta Head W/wo Contrast  Result Date: 03/26/2019 CLINICAL DATA:  Left-sided weakness EXAM: CT ANGIOGRAPHY HEAD AND NECK CT PERFUSION BRAIN TECHNIQUE: Multidetector CT imaging of the head and neck was performed using the standard protocol during bolus administration of intravenous contrast. Multiplanar CT image reconstructions and MIPs were obtained to evaluate the vascular anatomy. Carotid stenosis measurements (when applicable) are obtained utilizing NASCET criteria, using the distal internal carotid diameter as the denominator. Multiphase CT imaging of the brain  was performed following IV bolus contrast injection. Subsequent parametric perfusion maps were calculated using RAPID software. CONTRAST:  176mL OMNIPAQUE IOHEXOL 350 MG/ML SOLN COMPARISON:  Head CT 03/26/2019 FINDINGS: CTA NECK FINDINGS SKELETON: Multilevel degenerative disc disease and facet hypertrophy without bony spinal canal stenosis. OTHER NECK: Normal pharynx, larynx and major salivary glands. No cervical lymphadenopathy. Unremarkable thyroid gland. UPPER CHEST: No pneumothorax or pleural effusion. No nodules or masses. AORTIC ARCH:  There is mild calcific atherosclerosis of the aortic arch. There is no aneurysm, dissection or hemodynamically significant stenosis of the visualized ascending aorta and aortic arch. Normal variant aortic arch branching pattern with the brachiocephalic and left common carotid arteries sharing a common origin. The visualized proximal subclavian arteries are widely patent. RIGHT CAROTID SYSTEM: --Common carotid artery: Widely patent origin without common carotid artery dissection or aneurysm. --Internal carotid artery: No dissection, occlusion or aneurysm. Mild atherosclerotic calcification at the carotid bifurcation without hemodynamically significant stenosis. --External carotid artery: No acute abnormality. LEFT CAROTID SYSTEM: --Common carotid artery: Widely patent origin without common carotid artery dissection or aneurysm. --Internal carotid artery: No dissection, occlusion or aneurysm. Mild atherosclerotic calcification at the carotid bifurcation without hemodynamically significant stenosis. --External carotid artery: No acute abnormality. VERTEBRAL ARTERIES: Left dominant configuration. Both origins are clearly patent. No dissection, occlusion or flow-limiting stenosis to the skull base (V1-V3 segments). CTA HEAD FINDINGS POSTERIOR CIRCULATION: --Vertebral arteries: The right vertebral artery terminates in PICA. Left vertebral artery V4 segment is normal. --Posterior inferior cerebellar arteries (PICA): Normal --Anterior inferior cerebellar arteries (AICA): Patent origins from the basilar artery. --Basilar artery: Normal. --Superior cerebellar arteries: Normal. --Posterior cerebral arteries: Normal. The right PCA is predominantly supplied by the posterior communicating artery. ANTERIOR CIRCULATION: --Intracranial internal carotid arteries: Normal. --Anterior cerebral arteries (ACA): Normal. Both A1 segments are present. Patent anterior communicating artery (a-comm). --Middle cerebral arteries (MCA): On the  right, there is occlusion of a mid to distal right M2 branch (series 11, image 24 and series 12, image 12). VENOUS SINUSES: As permitted by contrast timing, patent. ANATOMIC VARIANTS: None Review of the MIP images confirms the above findings. CT Brain Perfusion Findings: ASPECTS: 8 CBF (<30%) Volume: 5mL Perfusion (Tmax>6.0s) volume: 66mL Mismatch Volume: 11mL Infarction Location:Right MCA territory, predominantly the right frontal operculum IMPRESSION: 1. Occlusion of a mid-distal right M2 branch, corresponding to the area of ischemia on the perfusion scan. These results were called by telephone at the time of interpretation on 03/26/2019 at 7:56 pm to Dr.ERIC [facility] , who verbally acknowledged these results. 2. 5 mL right MCA territory infarction, predominantly in the right frontal operculum, with 22 mL area of ischemic penumbra. 3. No hemodynamically significant stenosis of the carotid or vertebral arteries by the NASCET criteria. Aortic Atherosclerosis (ICD10-I70.0). Electronically Signed   By: Ulyses Jarred M.D.   On: 03/26/2019 20:07   Dg Elbow Complete Left (3+view)  Result Date: 03/30/2019 CLINICAL DATA:  Generalized left arm pain. EXAM: LEFT ELBOW - COMPLETE 3+ VIEW COMPARISON:  None. FINDINGS: Bones are diffusely demineralized. No acute fracture. No subluxation or dislocation. Fat pad elevation is consistent with joint effusion. Cortical irregularity noted at the lateral epicondyle, likely due to repetitive stress injury. IMPRESSION: Joint effusion at the elbow without acute bony abnormality evident. Electronically Signed   By: Misty Stanley M.D.   On: 03/30/2019 15:19   Ct Code Stroke Cta Neck W/wo Contrast  Result Date: 03/26/2019 CLINICAL DATA:  Left-sided weakness EXAM: CT ANGIOGRAPHY HEAD AND NECK CT PERFUSION BRAIN TECHNIQUE: Multidetector CT imaging  of the head and neck was performed using the standard protocol during bolus administration of intravenous contrast. Multiplanar CT image  reconstructions and MIPs were obtained to evaluate the vascular anatomy. Carotid stenosis measurements (when applicable) are obtained utilizing NASCET criteria, using the distal internal carotid diameter as the denominator. Multiphase CT imaging of the brain was performed following IV bolus contrast injection. Subsequent parametric perfusion maps were calculated using RAPID software. CONTRAST:  126mL OMNIPAQUE IOHEXOL 350 MG/ML SOLN COMPARISON:  Head CT 03/26/2019 FINDINGS: CTA NECK FINDINGS SKELETON: Multilevel degenerative disc disease and facet hypertrophy without bony spinal canal stenosis. OTHER NECK: Normal pharynx, larynx and major salivary glands. No cervical lymphadenopathy. Unremarkable thyroid gland. UPPER CHEST: No pneumothorax or pleural effusion. No nodules or masses. AORTIC ARCH: There is mild calcific atherosclerosis of the aortic arch. There is no aneurysm, dissection or hemodynamically significant stenosis of the visualized ascending aorta and aortic arch. Normal variant aortic arch branching pattern with the brachiocephalic and left common carotid arteries sharing a common origin. The visualized proximal subclavian arteries are widely patent. RIGHT CAROTID SYSTEM: --Common carotid artery: Widely patent origin without common carotid artery dissection or aneurysm. --Internal carotid artery: No dissection, occlusion or aneurysm. Mild atherosclerotic calcification at the carotid bifurcation without hemodynamically significant stenosis. --External carotid artery: No acute abnormality. LEFT CAROTID SYSTEM: --Common carotid artery: Widely patent origin without common carotid artery dissection or aneurysm. --Internal carotid artery: No dissection, occlusion or aneurysm. Mild atherosclerotic calcification at the carotid bifurcation without hemodynamically significant stenosis. --External carotid artery: No acute abnormality. VERTEBRAL ARTERIES: Left dominant configuration. Both origins are clearly patent.  No dissection, occlusion or flow-limiting stenosis to the skull base (V1-V3 segments). CTA HEAD FINDINGS POSTERIOR CIRCULATION: --Vertebral arteries: The right vertebral artery terminates in PICA. Left vertebral artery V4 segment is normal. --Posterior inferior cerebellar arteries (PICA): Normal --Anterior inferior cerebellar arteries (AICA): Patent origins from the basilar artery. --Basilar artery: Normal. --Superior cerebellar arteries: Normal. --Posterior cerebral arteries: Normal. The right PCA is predominantly supplied by the posterior communicating artery. ANTERIOR CIRCULATION: --Intracranial internal carotid arteries: Normal. --Anterior cerebral arteries (ACA): Normal. Both A1 segments are present. Patent anterior communicating artery (a-comm). --Middle cerebral arteries (MCA): On the right, there is occlusion of a mid to distal right M2 branch (series 11, image 24 and series 12, image 12). VENOUS SINUSES: As permitted by contrast timing, patent. ANATOMIC VARIANTS: None Review of the MIP images confirms the above findings. CT Brain Perfusion Findings: ASPECTS: 8 CBF (<30%) Volume: 39mL Perfusion (Tmax>6.0s) volume: 43mL Mismatch Volume: 29mL Infarction Location:Right MCA territory, predominantly the right frontal operculum IMPRESSION: 1. Occlusion of a mid-distal right M2 branch, corresponding to the area of ischemia on the perfusion scan. These results were called by telephone at the time of interpretation on 03/26/2019 at 7:56 pm to Dr.ERIC [facility] , who verbally acknowledged these results. 2. 5 mL right MCA territory infarction, predominantly in the right frontal operculum, with 22 mL area of ischemic penumbra. 3. No hemodynamically significant stenosis of the carotid or vertebral arteries by the NASCET criteria. Aortic Atherosclerosis (ICD10-I70.0). Electronically Signed   By: Ulyses Jarred M.D.   On: 03/26/2019 20:07   Ct Cervical Spine Wo Contrast  Result Date: 03/27/2019 CLINICAL DATA:  Fall and acute  stroke EXAM: CT CERVICAL SPINE WITHOUT CONTRAST TECHNIQUE: Multidetector CT imaging of the cervical spine was performed without intravenous contrast. Multiplanar CT image reconstructions were also generated. COMPARISON:  None. FINDINGS: Alignment: No static subluxation. Facets are aligned. Occipital condyles and the lateral masses  of C1 and C2 are normally approximated. Skull base and vertebrae: No acute fracture. Soft tissues and spinal canal: No prevertebral fluid or swelling. No visible canal hematoma. Disc levels: No advanced spinal canal or neural foraminal stenosis. Marked hypertrophy of the C1-2 level. Disc space narrowing is greatest at C5-6. Multilevel facet hypertrophy. Upper chest: No pneumothorax, pulmonary nodule or pleural effusion. Other: Normal visualized paraspinal cervical soft tissues. IMPRESSION: No acute fracture or static subluxation of the cervical spine. Electronically Signed   By: Ulyses Jarred M.D.   On: 03/27/2019 01:32   Mr Brain Wo Contrast  Result Date: 03/27/2019 CLINICAL DATA:  Slurred speech with left-sided facial droop EXAM: MRI HEAD WITHOUT CONTRAST TECHNIQUE: Multiplanar, multiecho pulse sequences of the brain and surrounding structures were obtained without intravenous contrast. COMPARISON:  Head CT 03/26/2019 FINDINGS: BRAIN: Multifocal acute ischemia throughout the right MCA territory, greatest along the right precentral gyrus and within the inferior lateral right frontal lobe. Moderate ischemia the right basal ganglia. Multifocal white matter hyperintensity, most commonly due to chronic ischemic microangiopathy. The cerebral and cerebellar volume are age-appropriate. There is no hydrocephalus. The midline structures are normal. No acute hemorrhage. VASCULAR: The major intracranial arterial and venous sinus flow voids are normal. Susceptibility-sensitive sequences show no chronic microhemorrhage or superficial siderosis. SKULL AND UPPER CERVICAL SPINE: Calvarial bone marrow  signal is normal. There is no skull base mass. The visualized upper cervical spine and soft tissues are normal. SINUSES/ORBITS: There are no fluid levels or advanced mucosal thickening. The mastoid air cells and middle ear cavities are free of fluid. The orbits are normal. IMPRESSION: Multifocal ischemic infarction of the right MCA territory without acute hemorrhage or mass effect. Electronically Signed   By: Ulyses Jarred M.D.   On: 03/27/2019 02:09   Ct Code Stroke Cta Cerebral Perfusion W/wo Contrast  Result Date: 03/26/2019 CLINICAL DATA:  Left-sided weakness EXAM: CT ANGIOGRAPHY HEAD AND NECK CT PERFUSION BRAIN TECHNIQUE: Multidetector CT imaging of the head and neck was performed using the standard protocol during bolus administration of intravenous contrast. Multiplanar CT image reconstructions and MIPs were obtained to evaluate the vascular anatomy. Carotid stenosis measurements (when applicable) are obtained utilizing NASCET criteria, using the distal internal carotid diameter as the denominator. Multiphase CT imaging of the brain was performed following IV bolus contrast injection. Subsequent parametric perfusion maps were calculated using RAPID software. CONTRAST:  189mL OMNIPAQUE IOHEXOL 350 MG/ML SOLN COMPARISON:  Head CT 03/26/2019 FINDINGS: CTA NECK FINDINGS SKELETON: Multilevel degenerative disc disease and facet hypertrophy without bony spinal canal stenosis. OTHER NECK: Normal pharynx, larynx and major salivary glands. No cervical lymphadenopathy. Unremarkable thyroid gland. UPPER CHEST: No pneumothorax or pleural effusion. No nodules or masses. AORTIC ARCH: There is mild calcific atherosclerosis of the aortic arch. There is no aneurysm, dissection or hemodynamically significant stenosis of the visualized ascending aorta and aortic arch. Normal variant aortic arch branching pattern with the brachiocephalic and left common carotid arteries sharing a common origin. The visualized proximal  subclavian arteries are widely patent. RIGHT CAROTID SYSTEM: --Common carotid artery: Widely patent origin without common carotid artery dissection or aneurysm. --Internal carotid artery: No dissection, occlusion or aneurysm. Mild atherosclerotic calcification at the carotid bifurcation without hemodynamically significant stenosis. --External carotid artery: No acute abnormality. LEFT CAROTID SYSTEM: --Common carotid artery: Widely patent origin without common carotid artery dissection or aneurysm. --Internal carotid artery: No dissection, occlusion or aneurysm. Mild atherosclerotic calcification at the carotid bifurcation without hemodynamically significant stenosis. --External carotid artery: No acute abnormality.  VERTEBRAL ARTERIES: Left dominant configuration. Both origins are clearly patent. No dissection, occlusion or flow-limiting stenosis to the skull base (V1-V3 segments). CTA HEAD FINDINGS POSTERIOR CIRCULATION: --Vertebral arteries: The right vertebral artery terminates in PICA. Left vertebral artery V4 segment is normal. --Posterior inferior cerebellar arteries (PICA): Normal --Anterior inferior cerebellar arteries (AICA): Patent origins from the basilar artery. --Basilar artery: Normal. --Superior cerebellar arteries: Normal. --Posterior cerebral arteries: Normal. The right PCA is predominantly supplied by the posterior communicating artery. ANTERIOR CIRCULATION: --Intracranial internal carotid arteries: Normal. --Anterior cerebral arteries (ACA): Normal. Both A1 segments are present. Patent anterior communicating artery (a-comm). --Middle cerebral arteries (MCA): On the right, there is occlusion of a mid to distal right M2 branch (series 11, image 24 and series 12, image 12). VENOUS SINUSES: As permitted by contrast timing, patent. ANATOMIC VARIANTS: None Review of the MIP images confirms the above findings. CT Brain Perfusion Findings: ASPECTS: 8 CBF (<30%) Volume: 31mL Perfusion (Tmax>6.0s) volume:  10mL Mismatch Volume: 72mL Infarction Location:Right MCA territory, predominantly the right frontal operculum IMPRESSION: 1. Occlusion of a mid-distal right M2 branch, corresponding to the area of ischemia on the perfusion scan. These results were called by telephone at the time of interpretation on 03/26/2019 at 7:56 pm to Dr.ERIC [facility] , who verbally acknowledged these results. 2. 5 mL right MCA territory infarction, predominantly in the right frontal operculum, with 22 mL area of ischemic penumbra. 3. No hemodynamically significant stenosis of the carotid or vertebral arteries by the NASCET criteria. Aortic Atherosclerosis (ICD10-I70.0). Electronically Signed   By: Ulyses Jarred M.D.   On: 03/26/2019 20:07   Dg Chest Port 1 View  Result Date: 03/31/2019 CLINICAL DATA:  Follow-up pneumonia EXAM: PORTABLE CHEST 1 VIEW COMPARISON:  03/29/2019 FINDINGS: Cardiac shadow is mildly prominent but stable. Aortic calcifications are again seen. Improved aeration is noted in the left lung base with some residual atelectasis noted. No focal confluent infiltrate is seen. No acute bony abnormality is noted. IMPRESSION: Improved aeration in the left base with residual mild atelectasis noted. Electronically Signed   By: Inez Catalina M.D.   On: 03/31/2019 20:01   Dg Chest Port 1 View  Result Date: 03/29/2019 CLINICAL DATA:  Respiratory failure. EXAM: PORTABLE CHEST 1 VIEW COMPARISON:  03/28/2019 FINDINGS: Patient is rotated to the left. Lungs are adequately inflated with minimal hazy left base density which may be due to small effusion/atelectasis. Cardiomediastinal silhouette and remainder of the exam is unchanged. IMPRESSION: Minimal hazy left base opacification which may be due to small effusion/atelectasis. Electronically Signed   By: Marin Olp M.D.   On: 03/29/2019 10:09   Dg Chest Port 1 View  Result Date: 03/28/2019 CLINICAL DATA:  Abnormal respirations.  History of stroke. EXAM: PORTABLE CHEST 1 VIEW  COMPARISON:  Single-view of the chest 03/26/2019. FINDINGS: Endotracheal tube has been removed. Mild right basilar atelectasis is seen. Lungs are otherwise clear. Cardiomegaly. Atherosclerosis. No pneumothorax or pleural effusion. IMPRESSION: Status post extubation. Cardiomegaly without acute disease. Right basilar atelectasis noted. Electronically Signed   By: Inge Rise M.D.   On: 03/28/2019 08:51   Dg Chest Port 1 View  Result Date: 03/26/2019 CLINICAL DATA:  Stroke EXAM: PORTABLE CHEST 1 VIEW COMPARISON:  None. FINDINGS: Endotracheal tube is 4 cm above the carina. NG tube is in the stomach. Heart is normal size. Increased markings throughout the lungs, likely chronic/scarring. No confluent opacities or effusions. IMPRESSION: Chronic changes.  No acute cardiopulmonary disease. Electronically Signed   By: Rolm Baptise M.D.  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 Electronically Signed   By: Margarette Canada M.D.   On: 03/30/2019 15:11   Result Date: 03/30/2019 CLINICAL DATA:  Generalized LEFT shoulder pain.  Limited mobility. EXAM: LEFT SHOULDER - 2+ VIEW COMPARISON:  None. FINDINGS: High-riding humeral head and MEDIAL position of the humeral head is unchanged from prior radiographs. Moderate to severe degenerative changes at the glenohumeral joint noted. No acute fracture noted. No suspicious focal bony lesions present. IMPRESSION: Unchanged high-riding and MEDIAL position of the humeral head with moderate to severe degenerative changes. It is difficult to determine if the humeral head is located on this study, but not acute. Electronically Signed: By: Margarette Canada M.D. On: 03/30/2019 14:59   Dg Swallowing Func-speech Pathology  Result Date: 03/28/2019 Objective Swallowing Evaluation: Type of Study: MBS-Modified Barium Swallow Study   Patient Details Name: Kamyrah Whisenant MRN: [id number] Date of Birth: 1930-09-17 Today's Date: 03/28/2019 Time: SLP Start Time (ACUTE ONLY): [id number] -SLP Stop Time (ACUTE ONLY): 1323 SLP Time Calculation (min) (ACUTE ONLY): 20 min Past Medical History: No past medical history on file. Past Surgical History: Past Surgical History: Procedure Laterality Date  RADIOLOGY WITH ANESTHESIA Left 03/26/2019  Procedure: RADIOLOGY WITH ANESTHESIA;  Surgeon: Luanne Bras, MD;  Location: [place] [facility];  Service: Radiology;  Laterality: Left; HPI: Pt is an 85 y.o. female who lives independently and presented to teh ED secondary to fall from a standing position, slurred speech, left sided facial droop, right gaze preference and left arm weakness. MRI of the brain showed multifocal ischemic infarction of the right MCA territory without acute hemorrhage or mass effect. Intubated 9/9-9/10.  No data recorded Assessment / Plan / Recommendation CHL IP CLINICAL IMPRESSIONS 03/28/2019 Clinical Impression Mild-moderate oropharyngeal dysphagia with decreased oral cohesion, laryngeal penetration and residual. Control and propulsion of boluses was challenging for pt and she used head extention to assist in bolus transfer to oropharynx. Anterior leakage on left side and lingual residue spilling into pharynx, laryngeal vestibule reaching vocal cords without adequate sensation to expel. Nectar thick consistently penetrated due to inadequate timing and incomplete epiglottic inversion. Cues for cough were ineffective to remove. Vallecular residue ranged mild to max and mild in pyriform sinuses. Of note pt was sleepy this afternoon in comparison to bedside assessment. Dys 1 (puree), honey thick recommended, with assist, two swallows and pills whole in puree. Son educated in room after study and voiced understanding of recommendations.       SLP Visit Diagnosis Dysphagia, oropharyngeal phase (R13.12) Attention and concentration deficit following -- Frontal lobe  and executive function deficit following -- Impact on safety and function Moderate aspiration risk   CHL IP TREATMENT RECOMMENDATION 03/28/2019 Treatment Recommendations Therapy as outlined in treatment plan below   Prognosis 03/28/2019 Prognosis for Safe Diet Advancement Good Barriers to Reach Goals -- Barriers/Prognosis Comment -- CHL IP DIET RECOMMENDATION 03/28/2019 SLP Diet Recommendations Dysphagia 1 (Puree) solids;Honey thick liquids Liquid Administration via Cup Medication Administration Whole meds with puree Compensations Slow rate;Small sips/bites;Multiple dry swallows after each bite/sip;Lingual sweep for clearance of pocketing Postural Changes Seated upright at 90 degrees   CHL IP OTHER RECOMMENDATIONS 03/28/2019 Recommended Consults -- Oral Care Recommendations Oral care BID Other Recommendations --   CHL IP FOLLOW UP RECOMMENDATIONS 03/28/2019 Follow up Recommendations 24 hour supervision/assistance   CHL IP FREQUENCY AND DURATION 03/28/2019  Speech Therapy Frequency (ACUTE ONLY) min 2x/week Treatment Duration 2 weeks      CHL IP ORAL PHASE 03/28/2019 Oral Phase Impaired Oral - Pudding Teaspoon -- Oral - Pudding Cup -- Oral - Honey Teaspoon Left anterior bolus loss;Weak lingual manipulation;Reduced posterior propulsion;Delayed oral transit Oral - Honey Cup Left anterior bolus loss;Weak lingual manipulation;Reduced posterior propulsion;Delayed oral transit Oral - Nectar Teaspoon -- Oral - Nectar Cup Lingual/palatal residue;Weak lingual manipulation;Left anterior bolus loss;Reduced posterior propulsion;Decreased bolus cohesion;Delayed oral transit Oral - Nectar Straw -- Oral - Thin Teaspoon -- Oral - Thin Cup -- Oral - Thin Straw -- Oral - Puree Decreased bolus cohesion;Delayed oral transit;Reduced posterior propulsion Oral - Mech Soft -- Oral - Regular -- Oral - Multi-Consistency -- Oral - Pill -- Oral Phase - Comment --  CHL IP PHARYNGEAL PHASE 03/28/2019 Pharyngeal Phase Impaired Pharyngeal- Pudding Teaspoon  -- Pharyngeal -- Pharyngeal- Pudding Cup -- Pharyngeal -- Pharyngeal- Honey Teaspoon Pharyngeal residue - valleculae;Pharyngeal residue - pyriform Pharyngeal -- Pharyngeal- Honey Cup Penetration/Aspiration during swallow;Pharyngeal residue - valleculae;Pharyngeal residue - pyriform Pharyngeal Material enters airway, remains ABOVE vocal cords and not ejected out Pharyngeal- Nectar Teaspoon -- Pharyngeal -- Pharyngeal- Nectar Cup Pharyngeal residue - pyriform;Pharyngeal residue - valleculae;Penetration/Aspiration before swallow Pharyngeal Material enters airway, remains ABOVE vocal cords then ejected out Pharyngeal- Nectar Straw -- Pharyngeal -- Pharyngeal- Thin Teaspoon -- Pharyngeal -- Pharyngeal- Thin Cup -- Pharyngeal -- Pharyngeal- Thin Straw -- Pharyngeal -- Pharyngeal- Puree Pharyngeal residue - valleculae;Delayed swallow initiation-pyriform sinuses Pharyngeal -- Pharyngeal- Mechanical Soft -- Pharyngeal -- Pharyngeal- Regular -- Pharyngeal -- Pharyngeal- Multi-consistency -- Pharyngeal -- Pharyngeal- Pill -- Pharyngeal -- Pharyngeal Comment --  CHL IP CERVICAL ESOPHAGEAL PHASE 03/28/2019 Cervical Esophageal Phase WFL Pudding Teaspoon -- Pudding Cup -- Honey Teaspoon -- Honey Cup -- Nectar Teaspoon -- Nectar Cup -- Nectar Straw -- Thin Teaspoon -- Thin Cup -- Thin Straw -- Puree -- Mechanical Soft -- Regular -- Multi-consistency -- Pill -- Cervical Esophageal Comment -- [place] Siren 03/28/2019, 3:11 PM Orbie Pyo Litaker M.Ed Actor Pager [phone] Office [phone]              Ct Head Code Stroke Wo Contrast  Result Date: 03/26/2019 CLINICAL DATA:  Code stroke.  Slurred speech EXAM: CT HEAD WITHOUT CONTRAST TECHNIQUE: Contiguous axial images were obtained from the base of the skull through the vertex without intravenous contrast. COMPARISON:  None. FINDINGS: Brain: There is no mass, hemorrhage or extra-axial collection. The size and configuration of the ventricles and  extra-axial CSF spaces are normal. There is hypoattenuation of the periventricular white matter, most commonly indicating chronic ischemic microangiopathy. Vascular: No abnormal hyperdensity of the major intracranial arteries or dural venous sinuses. No intracranial atherosclerosis. Skull: The visualized skull base, calvarium and extracranial soft tissues are normal. Sinuses/Orbits: No fluid levels or advanced mucosal thickening of the visualized paranasal sinuses. No mastoid or middle ear effusion. The orbits are normal. ASPECTS [facility] Stroke Program Early CT Score) - Ganglionic level infarction (caudate, lentiform nuclei, internal capsule, insula, M1-M3 cortex): 5 - Supraganglionic infarction (M4-M6 cortex): 3 Total score (0-10 with 10 being normal): 8 IMPRESSION: 1. No intracranial hemorrhage. 2. ASPECTS is 8 These results were called by telephone at the time of interpretation on 03/26/2019 at 7:46 pm to provider Dr. Kerney Elbe, who verbally acknowledged these results. Electronically Signed   By: Ulyses Jarred M.D.   On: 03/26/2019 19:48   Vas [place] Lower Extremity Venous (dvt)  Result Date: 03/27/2019  Lower Venous Study  Indications: Stroke.  Comparison Study: No priors. Performing Technologist: Oda Cogan RDMS, RVT  Examination Guidelines: A complete evaluation includes B-mode imaging, spectral Doppler, color Doppler, and power Doppler as needed of all accessible portions of each vessel. Bilateral testing is considered an integral part of a complete examination. Limited examinations for reoccurring indications may be performed as noted.  +---------+---------------+---------+-----------+----------+--------------+  RIGHT     Compressibility Phasicity Spontaneity Properties Thrombus Aging  +---------+---------------+---------+-----------+----------+--------------+  CFV       Full            Yes       Yes                                     +---------+---------------+---------+-----------+----------+--------------+  SFJ       Full                                                             +---------+---------------+---------+-----------+----------+--------------+  FV Prox   Full                                                             +---------+---------------+---------+-----------+----------+--------------+  FV Mid    Full                                                             +---------+---------------+---------+-----------+----------+--------------+  FV Distal Full                                                             +---------+---------------+---------+-----------+----------+--------------+  PFV       Full                                                             +---------+---------------+---------+-----------+----------+--------------+  POP       Full            Yes       Yes                                    +---------+---------------+---------+-----------+----------+--------------+  PTV       Full                                                             +---------+---------------+---------+-----------+----------+--------------+  PERO      Full                                                             +---------+---------------+---------+-----------+----------+--------------+   +---------+---------------+---------+-----------+----------+--------------+  LEFT      Compressibility Phasicity Spontaneity Properties Thrombus Aging  +---------+---------------+---------+-----------+----------+--------------+  CFV       Full            Yes       Yes                                    +---------+---------------+---------+-----------+----------+--------------+  SFJ       Full                                                             +---------+---------------+---------+-----------+----------+--------------+  FV Prox   Full                                                              +---------+---------------+---------+-----------+----------+--------------+  FV Mid    Full                                                             +---------+---------------+---------+-----------+----------+--------------+  FV Distal Full                                                             +---------+---------------+---------+-----------+----------+--------------+  PFV       Full                                                             +---------+---------------+---------+-----------+----------+--------------+  POP       Full            Yes       Yes                                    +---------+---------------+---------+-----------+----------+--------------+  PTV       Full                                                             +---------+---------------+---------+-----------+----------+--------------+  PERO      Full                                                             +---------+---------------+---------+-----------+----------+--------------+     Summary: Right: There is no evidence of deep vein thrombosis in the lower extremity. No cystic structure found in the popliteal fossa. Left: There is no evidence of deep vein thrombosis in the lower extremity. A cystic structure is found in the popliteal fossa.  *See table(s) above for measurements and observations. Electronically signed by Monica Martinez MD on 03/27/2019 at 3:20:28 PM.    Final    Ir Angio Intra Extracran Sel Com Carotid Innominate Uni R Mod Sed  Result Date: 03/31/2019 CLINICAL DATA:  Acute onset of left-sided neglect. Left-sided weakness. Suspected occlusion of the superior division of the right middle cerebral artery M2 mid to distal region. EXAM: IR ANGIO VERTEBRAL SEL SUBCLAVIAN INNOMINATE UNI RIGHT MOD SED; IR ANGIO INTRA EXTRACRAN SEL COM CAROTID INNOMINATE UNI RIGHT MOD SED COMPARISON:  CT angiogram of the head and neck of 03/26/2019. MEDICATIONS: Heparin 0 units IV; 2 g Ancef antibiotic was administered within  1 hour of the procedure. ANESTHESIA/SEDATION: General anesthesia. CONTRAST:  Isovue 300 approximately 30 cc. FLUOROSCOPY TIME:  Fluoroscopy Time: 2 minutes 42 seconds (242 mGy). COMPLICATIONS: None immediate. TECHNIQUE: Informed written consent was obtained from the patient's son after a thorough discussion of the procedural risks, benefits and alternatives. All questions were addressed. Maximal Sterile Barrier Technique was utilized including caps, mask, sterile gowns, sterile gloves, sterile drape, hand hygiene and skin antiseptic. A timeout was performed prior to the initiation of the procedure. The right groin was prepped and draped in the usual sterile fashion. Thereafter using modified Seldinger technique, transfemoral access into the right common femoral artery was obtained without difficulty. Over a 0.035 inch guidewire, a 5 French Pinnacle sheath was inserted. Through this, and also over 0.035 inch guidewire, a 5 [place] JB 1 catheter was advanced to the aortic arch region and selectively positioned in the innominate artery and the right common carotid artery. FINDINGS: The innominate artery angiogram demonstrates the origins of the right common carotid artery and the right subclavian artery to be widely patent. The visualized portion of the right vertebral artery at its origin and distally demonstrates wide patency. The vessel is seen to opacify to the cranial skull base. The right common carotid arteriogram demonstrates the right external carotid artery and its major branches to be widely patent. The right internal carotid artery at the bulb to the cranial skull base demonstrates wide patency. Petrous, cavernous and the supraclinoid segments are widely patent. A right posterior communicating artery is seen opacifying the right posterior cerebral artery distribution. The right middle cerebral artery, and the right anterior cerebral artery opacify into the capillary and venous phases. The mid to delayed  arterial phase demonstrates angiographic occlusion of an anterior perisylvian branch of the superior division of the right middle cerebral artery in the distal M2 M3 junction. 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 Given the size of this vessel distally endovascular revascularization is felt to be too risky given the availability of the present stent retrievers. PLAN: As per referring MD.  Electronically Signed   By: Luanne Bras M.D.   On: 03/27/2019 09:43   Ir Angio Vertebral Sel Subclavian Innominate Uni R Mod Sed  Result Date: 03/31/2019 CLINICAL DATA:  Acute onset of left-sided neglect. Left-sided weakness. Suspected occlusion of the superior division of the right middle cerebral artery M2 mid to distal region. EXAM: IR ANGIO VERTEBRAL SEL SUBCLAVIAN INNOMINATE UNI RIGHT MOD SED; IR ANGIO INTRA EXTRACRAN SEL COM CAROTID INNOMINATE UNI RIGHT MOD SED COMPARISON:  CT angiogram of the head and neck of 03/26/2019. MEDICATIONS: Heparin 0 units IV; 2 g Ancef antibiotic was administered within 1 hour of the procedure. ANESTHESIA/SEDATION: General anesthesia. CONTRAST:  Isovue 300 approximately 30 cc. FLUOROSCOPY TIME:  Fluoroscopy Time: 2 minutes 42 seconds (242 mGy). COMPLICATIONS: None immediate. TECHNIQUE: Informed written consent was obtained from the patient's son after a thorough discussion of the procedural risks, benefits and alternatives. All questions were addressed. Maximal Sterile Barrier Technique was utilized including caps, mask, sterile gowns, sterile gloves, sterile drape, hand hygiene and skin antiseptic. A timeout was performed prior to the initiation of the procedure. The right groin was prepped and draped in the usual sterile fashion. Thereafter using modified Seldinger technique, transfemoral access into the right common femoral artery was obtained without difficulty. Over a  0.035 inch guidewire, a 5 French Pinnacle sheath was inserted. Through this, and also over 0.035 inch guidewire, a 5 [place] JB 1 catheter was advanced to the aortic arch region and selectively positioned in the innominate artery and the right common carotid artery. FINDINGS: The innominate artery angiogram demonstrates the origins of the right common carotid artery and the right subclavian artery to be widely patent. The visualized portion of the right vertebral artery at its origin and distally demonstrates wide patency. The vessel is seen to opacify to the cranial skull base. The right common carotid arteriogram demonstrates the right external carotid artery and its major branches to be widely patent. The right internal carotid artery at the bulb to the cranial skull base demonstrates wide patency. Petrous, cavernous and the supraclinoid segments are widely patent. A right posterior communicating artery is seen opacifying the right posterior cerebral artery distribution. The right middle cerebral artery, and the right anterior cerebral artery opacify into the capillary and venous phases. The mid to delayed arterial phase demonstrates angiographic occlusion of an anterior perisylvian branch of the superior division of the right middle cerebral artery in the distal M2 M3 junction. 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 Given the size of this vessel distally endovascular revascularization is felt to be too risky given the availability of the present stent retrievers. PLAN: As per referring MD. Electronically Signed   By: Luanne Bras M.D.   On: 03/27/2019 09:43    Time Spent in minutes  30   Lala Lund M.D on 04/01/2019 at 10:04 AM  To page go to [email or web address] - password [facility]

## 2019-04-01 NOTE — TOC Transition Note (Addendum)
Transition of Care [facility]) - CM/SW Discharge Note   Patient Details  Name: Lissete Rooker MRN: [id number] Date of Birth: [birth year]  [facility]) CM/SW Contact:  Geralynn Ochs, LCSW Phone Number: 04/01/2019, 3:55 PM   Clinical Narrative:   Nurse to call report to [phone], Room 7009.  Patient will need bowel movement prior to discharge. Please call report after bowel movement to update SNF that she is coming. Call [facility] for pick-up.    Final next level of care: Skilled Nursing Facility Barriers to Discharge: Barriers Resolved   Patient Goals and CMS Choice Patient states their goals for this hospitalization and ongoing recovery are:: Pt and son are agreeable to rehab CMS Medicare.gov Compare Post Acute Care list provided to:: Patient Represenative (must comment) Choice offered to / list presented to : Adult Children  Discharge Placement              Patient chooses bed at: [name] Patient to be transferred to facility by: [facility] Name of family member notified: Vevelyn Royals Patient and family notified of of transfer: 04/01/19  Discharge Plan and Services In-house Referral: Clinical Social Work Discharge Planning Services: NA Post Acute Care Choice: [facility]          DME Arranged: N/A DME Agency: NA       HH Arranged: NA HH Agency: NA        Social Determinants of Health (SDOH) Interventions     Readmission Risk Interventions No flowsheet data found.

## 2019-04-01 NOTE — Plan of Care (Signed)
  Problem: Education: Goal: Knowledge of disease or condition will improve Outcome: Progressing Goal: Knowledge of secondary prevention will improve Outcome: Progressing Goal: Knowledge of patient specific risk factors addressed and post discharge goals established will improve Outcome: Progressing Goal: Individualized Educational Video(s) Outcome: Progressing   Problem: Coping: Goal: Will identify appropriate support needs Outcome: Progressing   Problem: Health Behavior/Discharge Planning: Goal: Ability to manage health-related needs will improve Outcome: Progressing   Problem: Self-Care: Goal: Ability to participate in self-care as condition permits will improve Outcome: Progressing Goal: Verbalization of feelings and concerns over difficulty with self-care will improve Outcome: Progressing Goal: Ability to communicate needs accurately will improve Outcome: Progressing   Problem: Nutrition: Goal: Risk of aspiration will decrease Outcome: Progressing Goal: Dietary intake will improve Outcome: Progressing   Problem: Ischemic Stroke/TIA Tissue Perfusion: Goal: Complications of ischemic stroke/TIA will be minimized Outcome: Progressing   Ival Bible, BSN, RN

## 2019-04-02 DIAGNOSIS — E039 Hypothyroidism, unspecified: Secondary | ICD-10-CM | POA: Diagnosis not present

## 2019-04-02 DIAGNOSIS — M810 Age-related osteoporosis without current pathological fracture: Secondary | ICD-10-CM | POA: Diagnosis not present

## 2019-04-02 DIAGNOSIS — E440 Moderate protein-calorie malnutrition: Secondary | ICD-10-CM | POA: Diagnosis not present

## 2019-04-02 DIAGNOSIS — R2681 Unsteadiness on feet: Secondary | ICD-10-CM | POA: Diagnosis not present

## 2019-04-02 DIAGNOSIS — I69392 Facial weakness following cerebral infarction: Secondary | ICD-10-CM | POA: Diagnosis not present

## 2019-04-02 DIAGNOSIS — I639 Cerebral infarction, unspecified: Secondary | ICD-10-CM | POA: Diagnosis not present

## 2019-04-02 DIAGNOSIS — G459 Transient cerebral ischemic attack, unspecified: Secondary | ICD-10-CM | POA: Diagnosis not present

## 2019-04-02 DIAGNOSIS — K219 Gastro-esophageal reflux disease without esophagitis: Secondary | ICD-10-CM | POA: Diagnosis not present

## 2019-04-02 DIAGNOSIS — G629 Polyneuropathy, unspecified: Secondary | ICD-10-CM | POA: Diagnosis not present

## 2019-04-02 DIAGNOSIS — E785 Hyperlipidemia, unspecified: Secondary | ICD-10-CM | POA: Diagnosis not present

## 2019-04-02 DIAGNOSIS — I129 Hypertensive chronic kidney disease with stage 1 through stage 4 chronic kidney disease, or unspecified chronic kidney disease: Secondary | ICD-10-CM | POA: Diagnosis not present

## 2019-04-02 DIAGNOSIS — R278 Other lack of coordination: Secondary | ICD-10-CM | POA: Diagnosis not present

## 2019-04-02 DIAGNOSIS — I69391 Dysphagia following cerebral infarction: Secondary | ICD-10-CM | POA: Diagnosis not present

## 2019-04-02 DIAGNOSIS — N183 Chronic kidney disease, stage 3 unspecified: Secondary | ICD-10-CM | POA: Diagnosis not present

## 2019-04-02 DIAGNOSIS — I350 Nonrheumatic aortic (valve) stenosis: Secondary | ICD-10-CM | POA: Diagnosis not present

## 2019-04-02 DIAGNOSIS — I63511 Cerebral infarction due to unspecified occlusion or stenosis of right middle cerebral artery: Secondary | ICD-10-CM | POA: Diagnosis not present

## 2019-04-02 DIAGNOSIS — M2550 Pain in unspecified joint: Secondary | ICD-10-CM | POA: Diagnosis not present

## 2019-04-02 DIAGNOSIS — D72829 Elevated white blood cell count, unspecified: Secondary | ICD-10-CM | POA: Diagnosis not present

## 2019-04-02 DIAGNOSIS — Z7401 Bed confinement status: Secondary | ICD-10-CM | POA: Diagnosis not present

## 2019-04-02 DIAGNOSIS — K649 Unspecified hemorrhoids: Secondary | ICD-10-CM | POA: Diagnosis not present

## 2019-04-02 DIAGNOSIS — M6281 Muscle weakness (generalized): Secondary | ICD-10-CM | POA: Diagnosis not present

## 2019-04-02 DIAGNOSIS — R1312 Dysphagia, oropharyngeal phase: Secondary | ICD-10-CM | POA: Diagnosis not present

## 2019-04-02 DIAGNOSIS — D649 Anemia, unspecified: Secondary | ICD-10-CM | POA: Diagnosis not present

## 2019-04-02 DIAGNOSIS — I480 Paroxysmal atrial fibrillation: Secondary | ICD-10-CM | POA: Diagnosis not present

## 2019-04-02 DIAGNOSIS — M19012 Primary osteoarthritis, left shoulder: Secondary | ICD-10-CM | POA: Diagnosis not present

## 2019-04-02 DIAGNOSIS — Z9181 History of falling: Secondary | ICD-10-CM | POA: Diagnosis not present

## 2019-04-02 DIAGNOSIS — K6289 Other specified diseases of anus and rectum: Secondary | ICD-10-CM | POA: Diagnosis not present

## 2019-04-02 DIAGNOSIS — I69354 Hemiplegia and hemiparesis following cerebral infarction affecting left non-dominant side: Secondary | ICD-10-CM | POA: Diagnosis not present

## 2019-04-02 DIAGNOSIS — R2689 Other abnormalities of gait and mobility: Secondary | ICD-10-CM | POA: Diagnosis not present

## 2019-04-02 DIAGNOSIS — I69398 Other sequelae of cerebral infarction: Secondary | ICD-10-CM | POA: Diagnosis not present

## 2019-04-02 DIAGNOSIS — M19011 Primary osteoarthritis, right shoulder: Secondary | ICD-10-CM | POA: Diagnosis not present

## 2019-04-02 DIAGNOSIS — I10 Essential (primary) hypertension: Secondary | ICD-10-CM | POA: Diagnosis not present

## 2019-04-02 LAB — GLUCOSE, CAPILLARY
Glucose-Capillary: 103 mg/dL — ABNORMAL HIGH (ref 70–99)
Glucose-Capillary: 124 mg/dL — ABNORMAL HIGH (ref 70–99)
Glucose-Capillary: 150 mg/dL — ABNORMAL HIGH (ref 70–99)
Glucose-Capillary: 195 mg/dL — ABNORMAL HIGH (ref 70–99)
Glucose-Capillary: 36 mg/dL — CL (ref 70–99)
Glucose-Capillary: 55 mg/dL — ABNORMAL LOW (ref 70–99)
Glucose-Capillary: 57 mg/dL — ABNORMAL LOW (ref 70–99)
Glucose-Capillary: 66 mg/dL — ABNORMAL LOW (ref 70–99)
Glucose-Capillary: 90 mg/dL (ref 70–99)

## 2019-04-02 MED ORDER — TAB-A-VITE/IRON PO TABS
1.0000 | ORAL_TABLET | Freq: Every day | ORAL | Status: DC
  Administered 2019-04-02: 1 via ORAL
  Filled 2019-04-02: qty 1

## 2019-04-02 MED ORDER — DEXTROSE 50 % IV SOLN
INTRAVENOUS | Status: AC
  Administered 2019-04-02: 50 mL
  Filled 2019-04-02: qty 50

## 2019-04-02 MED ORDER — CELECOXIB 200 MG PO CAPS
200.0000 mg | ORAL_CAPSULE | Freq: Every day | ORAL | Status: DC
  Administered 2019-04-02: 12:00:00 200 mg via ORAL
  Filled 2019-04-02: qty 1

## 2019-04-02 MED ORDER — CELECOXIB 200 MG PO CAPS: 200.0000 mg | ORAL_CAPSULE | Freq: Every day | ORAL | Status: DC

## 2019-04-02 NOTE — Progress Notes (Addendum)
Pt's BG showing on glucose meter for 2000 check, but not downloaded. Pt load on unit extremely demanding and being able to wait for NT to download meter immediately not possible with work load. Dr. Leonel Ramsay informed of BG 55, hypoglycemic protocol followed.

## 2019-04-02 NOTE — Progress Notes (Signed)
Patient left the floor transport via [facility] to [facility].  IV and telemetry discontinued.  Son at bedside,  Vital signs stable,  Comfortable with no c/o pain.

## 2019-04-02 NOTE — Progress Notes (Signed)
PROGRESS NOTE                                                                                                                                                                                                             Patient Demographics:    Nancy Zamora, is a 85 y.o. female, DOB - [birth year], [id number]  Admit date - 03/26/2019   Admitting Physician Kerney Elbe, MD  Outpatient Primary MD for the patient is System, Pcp Not In  LOS - 7  Chief Complaint  Patient presents with   Aphasia       Brief Narrative 85 year old female with history of hypertension, GERD, hypothyroidism, SVT who was admitted for right MCA CVA by neurology, we were consulted for possible UTI and transient hypotension.   Subjective:   Patient patient in bed, sleeping but easily arousable, denies any headache chest or abdominal pain.  Knows she is in the hospital.  Had a bowel movement and currently no subjective complaints.   Assessment  & Plan :     1.  Right MCA CVA with mild left upper arm weakness.  Stable defer management to primary team which is neurology.  Telford Nab seems to be stable on Eliquis and statin combination.  2.  Possible UTI with transient hypotension.  Blood pressure has stabilized, patient has good oral intake, stop IV fluids which she has been getting for several days, encourage oral diet, liberalize diet as long as it is soft and being swallowed properly.  Currently on Rocephin empirically for UTI should get 3 days of total antibiotics.  If she needs to be discharged she can be placed on Keflex 500 twice daily complete total of 3 days of treatment.  Urine cultures pending.  3.  SVT.  Continue Cardizem at present dose.  Dose was slightly reduced for transient episodes of hypotension when she sleeps.  4.  Hypertension.  Stable on lower than home dose Cardizem continue this dose.  Discontinue ARB for now.  5.  Dysphagia.  Per speech therapy currently on dysphagia 1 diet with  honey thick liquids.  6.  GERD.  Resume home dose PPI.  7.  Hypothyroidism.  On Synthroid at home dose.  8.  Constipation.  Good results after bowel regimen.   Family Communication  : None present  Code Status : Full  Disposition Plan  : SNF, stable for discharge per hospitalist standpoint.  Consults  : Hospitalist team consulting for hypotension, neurology primary  Procedures  :  DVT Prophylaxis  :  Eliquis  Lab Results  Component Value Date   PLT 239 04/01/2019    Diet :  Diet Order            DIET - DYS 1 Room service appropriate? No; Fluid consistency: Honey Thick  Diet effective now               Inpatient Medications Scheduled Meds:  apixaban  2.5 mg Oral BID   atorvastatin  20 mg Oral q1800   Chlorhexidine Gluconate Cloth  6 each Topical Daily   diltiazem  180 mg Oral Daily   levothyroxine  112 mcg Oral Daily   multivitamins with iron  1 tablet Oral Daily   pantoprazole  80 mg Oral Q1200   polyethylene glycol  17 g Oral Daily   vitamin C  500 mg Oral Daily   Vitamin D (Ergocalciferol)  50,000 Units Oral Weekly   Continuous Infusions:  cefTRIAXone (ROCEPHIN)  IV 1 g (04/01/19 2245)   PRN Meds:.acetaminophen **OR** acetaminophen (TYLENOL) oral liquid 160 mg/5 mL **OR** acetaminophen, bisacodyl, hydrALAZINE, Resource ThickenUp Clear, senna-docusate  Antibiotics  :   Anti-infectives (From admission, onward)   Start     Dose/Rate Route Frequency Ordered Stop   04/01/19 0000  cephALEXin (KEFLEX) 500 MG capsule     500 mg Oral 2 times daily 04/01/19 1412 04/05/19 2359   03/31/19 2300  cefTRIAXone (ROCEPHIN) 1 g in sodium chloride 0.9 % 100 mL IVPB    Note to Pharmacy: Start only after urine is collected for analysis and cx   1 g 200 mL/hr over 30 Minutes Intravenous Every 24 hours 03/31/19 1937     03/26/19 2102  vancomycin (VANCOCIN) 1-5 GM/200ML-% IVPB  Status:  Discontinued    Note to Pharmacy: Novella Rob   : cabinet override       03/26/19 2102 03/26/19 2147          Objective:   Vitals:   04/01/19 2028 04/02/19 0026 04/02/19 0356 04/02/19 0700  BP: (!) 143/78 134/68 129/65 140/87  Pulse:  78 75   Resp: 18  20 20   Temp: 99.2 F (37.3 C) 98.6 F (37 C) 98.8 F (37.1 C) 98.2 F (36.8 C)  TempSrc: Oral Oral  Oral  SpO2: 97% 99% 100%   Weight:      Height:        Wt Readings from Last 3 Encounters:  03/27/19 52 kg     Intake/Output Summary (Last 24 hours) at 04/02/2019 1010 Last data filed at 04/01/2019 2040 Gross per 24 hour  Intake 200 ml  Output 400 ml  Net -200 ml     Physical Exam  Awake Alert,  [place].AT,PERRAL Supple Neck,No JVD, No cervical lymphadenopathy appriciated.  Symmetrical Chest wall movement, Good air movement bilaterally, CTAB RRR,No Gallops, Rubs or new Murmurs, No Parasternal Heave +ve B.Sounds, Abd Soft, No tenderness, No organomegaly appriciated, No rebound - guarding or rigidity. No Cyanosis, Clubbing or edema, No new Rash or bruise     Data Review:    CBC Recent Labs  Lab 03/26/19 1858  03/27/19 0235  03/29/19 0340 03/30/19 0410 03/31/19 0717 03/31/19 1633 04/01/19 0321  WBC 16.2*  --  16.1*   < > 14.6* 12.4* 11.5* 13.6* 13.3*  HGB 12.5   < > 13.5   < > 11.0* 11.5* 12.6 12.7 11.8*  HCT 37.4   < > 40.7   < > 33.4* 34.4* 37.9 35.9* 35.8*  PLT 190  --  202   < > 153 153 211 209 239  MCV 93.5  --  92.1   < > 93.8 92.5 91.3 90.2 92.3  MCH 31.3  --  30.5   < > 30.9 30.9 30.4 31.9 30.4  MCHC 33.4  --  33.2   < > 32.9 33.4 33.2 35.4 33.0  RDW 13.2  --  13.2   < > 13.5 13.3 13.1 13.0 13.2  LYMPHSABS 1.1  --  2.3  --   --   --   --  2.0  --   MONOABS 1.1*  --  0.8  --   --   --   --  1.7*  --   EOSABS 0.0  --  0.0  --   --   --   --  0.1  --   BASOSABS 0.1  --  0.0  --   --   --   --  0.1  --    < > = values in this interval not displayed.    Chemistries  Recent Labs  Lab 03/26/19 1858  03/26/19 2327  03/28/19 0154 03/29/19 0340 03/30/19 0410  03/31/19 0717 03/31/19 1633 04/01/19 0321  NA 134*   < >  --    < > 137 136 135 134* 133* 137  K 4.5   < >  --    < > 4.0 3.7 3.8 3.4* 3.2* 3.9  CL 100   < >  --    < > 106 105 106 101 102 106  CO2 25  --   --    < > 23 21* 21* 24 22 20*  GLUCOSE 165*   < >  --    < > 102* 89 95 104* 169* 100*  BUN 14   < >  --    < > 14 14 13 10 14 19   CREATININE 0.96   < >  --    < > 0.81 0.76 0.58 0.60 0.87 0.78  CALCIUM 9.1  --   --    < > 8.2* 8.0* 7.9* 8.1* 7.8* 7.7*  MG  --   --  2.0  --  1.9 2.1  --   --   --   --   AST 29  --   --   --   --   --   --   --   --   --   ALT 14  --   --   --   --   --   --   --   --   --   ALKPHOS 57  --   --   --   --   --   --   --   --   --   BILITOT 0.9  --   --   --   --   --   --   --   --   --    < > = values in this interval not displayed.   ------------------------------------------------------------------------------------------------------------------ No results for input(s): CHOL, HDL, LDLCALC, TRIG, CHOLHDL, LDLDIRECT in the last 72 hours.  Lab Results  Component Value Date   HGBA1C 5.5 03/27/2019   ------------------------------------------------------------------------------------------------------------------ Recent Labs    04/01/19 0727  TSH 4.157   ------------------------------------------------------------------------------------------------------------------ Recent Labs    03/31/19 2040  VITAMINB12 4,339*    Coagulation profile Recent Labs  Lab 03/26/19 1858  INR 1.0    No results for input(s): DDIMER in the  last 72 hours.  Cardiac Enzymes Recent Labs  Lab 03/31/19 1633  CKMB 2.0   ------------------------------------------------------------------------------------------------------------------ No results found for: BNP  Micro Results Recent Results (from the past 240 hour(s))  SARS Coronavirus 2 [facility] order, Performed in [facility] hospital lab) Nasopharyngeal Nasopharyngeal Swab     Status: None   Collection  Time: 03/26/19  8:42 PM   Specimen: Nasopharyngeal Swab  Result Value Ref Range Status   SARS Coronavirus 2 NEGATIVE NEGATIVE Final    Comment: (NOTE) If result is NEGATIVE SARS-CoV-2 target nucleic acids are NOT DETECTED. The SARS-CoV-2 RNA is generally detectable in upper and lower  respiratory specimens during the acute phase of infection. The lowest  concentration of SARS-CoV-2 viral copies this assay can detect is 250  copies / mL. A negative result does not preclude SARS-CoV-2 infection  and should not be used as the sole basis for treatment or other  patient management decisions.  A negative result may occur with  improper specimen collection / handling, submission of specimen other  than nasopharyngeal swab, presence of viral mutation(s) within the  areas targeted by this assay, and inadequate number of viral copies  (<250 copies / mL). A negative result must be combined with clinical  observations, patient history, and epidemiological information. If result is POSITIVE SARS-CoV-2 target nucleic acids are DETECTED. The SARS-CoV-2 RNA is generally detectable in upper and lower  respiratory specimens dur ing the acute phase of infection.  Positive  results are indicative of active infection with SARS-CoV-2.  Clinical  correlation with patient history and other diagnostic information is  necessary to determine patient infection status.  Positive results do  not rule out bacterial infection or co-infection with other viruses. If result is PRESUMPTIVE POSTIVE SARS-CoV-2 nucleic acids MAY BE PRESENT.   A presumptive positive result was obtained on the submitted specimen  and confirmed on repeat testing.  While 2019 novel coronavirus  (SARS-CoV-2) nucleic acids may be present in the submitted sample  additional confirmatory testing may be necessary for epidemiological  and / or clinical management purposes  to differentiate between  SARS-CoV-2 and other Sarbecovirus currently known  to infect humans.  If clinically indicated additional testing with an alternate test  methodology [phone]) is advised. The SARS-CoV-2 RNA is generally  detectable in upper and lower respiratory sp ecimens during the acute  phase of infection. The expected result is Negative. Fact Sheet for Patients:  StrictlyIdeas.no Fact Sheet for Healthcare Providers: BankingDealers.co.za This test is not yet approved or cleared by the [place] FDA and has been authorized for detection and/or diagnosis of SARS-CoV-2 by FDA under an Emergency Use Authorization (EUA).  This EUA will remain in effect (meaning this test can be used) for the duration of the COVID-19 declaration under Section 564(b)(1) of the Act, 21 U.S.C. section 360bbb-3(b)(1), unless the authorization is terminated or revoked sooner. Performed at [facility], [place] [address], [place], [place] [address]   MRSA PCR Screening     Status: None   Collection Time: 03/26/19 11:27 PM   Specimen: Nasopharyngeal  Result Value Ref Range Status   MRSA by PCR NEGATIVE NEGATIVE Final    Comment:        The GeneXpert MRSA Assay (FDA approved for NASAL specimens only), is one component of a comprehensive MRSA colonization surveillance program. It is not intended to diagnose MRSA infection nor to guide or monitor treatment for MRSA infections. Performed at [facility], [place] [address],  [place], [place] [address]   Culture, Urine     Status: None   Collection Time: 03/27/19 12:29 AM   Specimen: Urine, Catheterized  Result Value Ref Range Status   Specimen Description URINE, CATHETERIZED  Final   Special Requests NONE  Final   Culture   Final    NO GROWTH Performed at [facility], [address] [place], [place] [address]    Report Status 03/28/2019 FINAL  Final  SARS Coronavirus 2 [facility] order, Performed in [facility] hospital lab) Nasopharyngeal Nasopharyngeal  Swab     Status: None   Collection Time: 03/31/19 11:53 AM   Specimen: Nasopharyngeal Swab  Result Value Ref Range Status   SARS Coronavirus 2 NEGATIVE NEGATIVE Final    Comment: (NOTE) If result is NEGATIVE SARS-CoV-2 target nucleic acids are NOT DETECTED. The SARS-CoV-2 RNA is generally detectable in upper and lower  respiratory specimens during the acute phase of infection. The lowest  concentration of SARS-CoV-2 viral copies this assay can detect is 250  copies / mL. A negative result does not preclude SARS-CoV-2 infection  and should not be used as the sole basis for treatment or other  patient management decisions.  A negative result may occur with  improper specimen collection / handling, submission of specimen other  than nasopharyngeal swab, presence of viral mutation(s) within the  areas targeted by this assay, and inadequate number of viral copies  (<250 copies / mL). A negative result must be combined with clinical  observations, patient history, and epidemiological information. If result is POSITIVE SARS-CoV-2 target nucleic acids are DETECTED. The SARS-CoV-2 RNA is generally detectable in upper and lower  respiratory specimens dur ing the acute phase of infection.  Positive  results are indicative of active infection with SARS-CoV-2.  Clinical  correlation with patient history and other diagnostic information is  necessary to determine patient infection status.  Positive results do  not rule out bacterial infection or co-infection with other viruses. If result is PRESUMPTIVE POSTIVE SARS-CoV-2 nucleic acids MAY BE PRESENT.   A presumptive positive result was obtained on the submitted specimen  and confirmed on repeat testing.  While 2019 novel coronavirus  (SARS-CoV-2) nucleic acids may be present in the submitted sample  additional confirmatory testing may be necessary for epidemiological  and / or clinical management purposes  to differentiate between  SARS-CoV-2  and other Sarbecovirus currently known to infect humans.  If clinically indicated additional testing with an alternate test  methodology [phone]) is advised. The SARS-CoV-2 RNA is generally  detectable in upper and lower respiratory sp ecimens during the acute  phase of infection. The expected result is Negative. Fact Sheet for Patients:  StrictlyIdeas.no Fact Sheet for Healthcare Providers: BankingDealers.co.za This test is not yet approved or cleared by the [place] FDA and has been authorized for detection and/or diagnosis of SARS-CoV-2 by FDA under an Emergency Use Authorization (EUA).  This EUA will remain in effect (meaning this test can be used) for the duration of the COVID-19 declaration under Section 564(b)(1) of the Act, 21 U.S.C. section 360bbb-3(b)(1), unless the authorization is terminated or revoked sooner. Performed at [facility], [place] [address] [place], [place] [address]   Culture, Urine     Status: Abnormal (Preliminary result)   Collection Time: 04/01/19  4:10 AM   Specimen: Urine, Random  Result Value Ref Range Status   Specimen Description URINE, RANDOM  Final   Special Requests NONE  Final   Culture (  A)  Final    >=100,000 COLONIES/mL GRAM NEGATIVE RODS CULTURE REINCUBATED FOR BETTER GROWTH Performed at [facility], [place] [address] [place], [place] [address]    Report Status PENDING  Incomplete    Radiology Reports Ct Code Stroke Cta Head W/wo Contrast  Result Date: 03/26/2019 CLINICAL DATA:  Left-sided weakness EXAM: CT ANGIOGRAPHY HEAD AND NECK CT PERFUSION BRAIN TECHNIQUE: Multidetector CT imaging of the head and neck was performed using the standard protocol during bolus administration of intravenous contrast. Multiplanar CT image reconstructions and MIPs were obtained to evaluate the vascular anatomy. Carotid stenosis measurements (when applicable) are obtained utilizing NASCET criteria,  using the distal internal carotid diameter as the denominator. Multiphase CT imaging of the brain was performed following IV bolus contrast injection. Subsequent parametric perfusion maps were calculated using RAPID software. CONTRAST:  115mL OMNIPAQUE IOHEXOL 350 MG/ML SOLN COMPARISON:  Head CT 03/26/2019 FINDINGS: CTA NECK FINDINGS SKELETON: Multilevel degenerative disc disease and facet hypertrophy without bony spinal canal stenosis. OTHER NECK: Normal pharynx, larynx and major salivary glands. No cervical lymphadenopathy. Unremarkable thyroid gland. UPPER CHEST: No pneumothorax or pleural effusion. No nodules or masses. AORTIC ARCH: There is mild calcific atherosclerosis of the aortic arch. There is no aneurysm, dissection or hemodynamically significant stenosis of the visualized ascending aorta and aortic arch. Normal variant aortic arch branching pattern with the brachiocephalic and left common carotid arteries sharing a common origin. The visualized proximal subclavian arteries are widely patent. RIGHT CAROTID SYSTEM: --Common carotid artery: Widely patent origin without common carotid artery dissection or aneurysm. --Internal carotid artery: No dissection, occlusion or aneurysm. Mild atherosclerotic calcification at the carotid bifurcation without hemodynamically significant stenosis. --External carotid artery: No acute abnormality. LEFT CAROTID SYSTEM: --Common carotid artery: Widely patent origin without common carotid artery dissection or aneurysm. --Internal carotid artery: No dissection, occlusion or aneurysm. Mild atherosclerotic calcification at the carotid bifurcation without hemodynamically significant stenosis. --External carotid artery: No acute abnormality. VERTEBRAL ARTERIES: Left dominant configuration. Both origins are clearly patent. No dissection, occlusion or flow-limiting stenosis to the skull base (V1-V3 segments). CTA HEAD FINDINGS POSTERIOR CIRCULATION: --Vertebral arteries: The right  vertebral artery terminates in PICA. Left vertebral artery V4 segment is normal. --Posterior inferior cerebellar arteries (PICA): Normal --Anterior inferior cerebellar arteries (AICA): Patent origins from the basilar artery. --Basilar artery: Normal. --Superior cerebellar arteries: Normal. --Posterior cerebral arteries: Normal. The right PCA is predominantly supplied by the posterior communicating artery. ANTERIOR CIRCULATION: --Intracranial internal carotid arteries: Normal. --Anterior cerebral arteries (ACA): Normal. Both A1 segments are present. Patent anterior communicating artery (a-comm). --Middle cerebral arteries (MCA): On the right, there is occlusion of a mid to distal right M2 branch (series 11, image 24 and series 12, image 12). VENOUS SINUSES: As permitted by contrast timing, patent. ANATOMIC VARIANTS: None Review of the MIP images confirms the above findings. CT Brain Perfusion Findings: ASPECTS: 8 CBF (<30%) Volume: 60mL Perfusion (Tmax>6.0s) volume: 85mL Mismatch Volume: 58mL Infarction Location:Right MCA territory, predominantly the right frontal operculum IMPRESSION: 1. Occlusion of a mid-distal right M2 branch, corresponding to the area of ischemia on the perfusion scan. These results were called by telephone at the time of interpretation on 03/26/2019 at 7:56 pm to Dr.ERIC [facility] , who verbally acknowledged these results. 2. 5 mL right MCA territory infarction, predominantly in the right frontal operculum, with 22 mL area of ischemic penumbra. 3. No hemodynamically significant stenosis of the carotid or vertebral arteries by the NASCET criteria. Aortic Atherosclerosis (ICD10-I70.0). Electronically Signed   By:  Ulyses Jarred M.D.   On: 03/26/2019 20:07   Dg Elbow Complete Left (3+view)  Result Date: 03/30/2019 CLINICAL DATA:  Generalized left arm pain. EXAM: LEFT ELBOW - COMPLETE 3+ VIEW COMPARISON:  None. FINDINGS: Bones are diffusely demineralized. No acute fracture. No subluxation or  dislocation. Fat pad elevation is consistent with joint effusion. Cortical irregularity noted at the lateral epicondyle, likely due to repetitive stress injury. IMPRESSION: Joint effusion at the elbow without acute bony abnormality evident. Electronically Signed   By: Misty Stanley M.D.   On: 03/30/2019 15:19   Ct Code Stroke Cta Neck W/wo Contrast  Result Date: 03/26/2019 CLINICAL DATA:  Left-sided weakness EXAM: CT ANGIOGRAPHY HEAD AND NECK CT PERFUSION BRAIN TECHNIQUE: Multidetector CT imaging of the head and neck was performed using the standard protocol during bolus administration of intravenous contrast. Multiplanar CT image reconstructions and MIPs were obtained to evaluate the vascular anatomy. Carotid stenosis measurements (when applicable) are obtained utilizing NASCET criteria, using the distal internal carotid diameter as the denominator. Multiphase CT imaging of the brain was performed following IV bolus contrast injection. Subsequent parametric perfusion maps were calculated using RAPID software. CONTRAST:  141mL OMNIPAQUE IOHEXOL 350 MG/ML SOLN COMPARISON:  Head CT 03/26/2019 FINDINGS: CTA NECK FINDINGS SKELETON: Multilevel degenerative disc disease and facet hypertrophy without bony spinal canal stenosis. OTHER NECK: Normal pharynx, larynx and major salivary glands. No cervical lymphadenopathy. Unremarkable thyroid gland. UPPER CHEST: No pneumothorax or pleural effusion. No nodules or masses. AORTIC ARCH: There is mild calcific atherosclerosis of the aortic arch. There is no aneurysm, dissection or hemodynamically significant stenosis of the visualized ascending aorta and aortic arch. Normal variant aortic arch branching pattern with the brachiocephalic and left common carotid arteries sharing a common origin. The visualized proximal subclavian arteries are widely patent. RIGHT CAROTID SYSTEM: --Common carotid artery: Widely patent origin without common carotid artery dissection or aneurysm.  --Internal carotid artery: No dissection, occlusion or aneurysm. Mild atherosclerotic calcification at the carotid bifurcation without hemodynamically significant stenosis. --External carotid artery: No acute abnormality. LEFT CAROTID SYSTEM: --Common carotid artery: Widely patent origin without common carotid artery dissection or aneurysm. --Internal carotid artery: No dissection, occlusion or aneurysm. Mild atherosclerotic calcification at the carotid bifurcation without hemodynamically significant stenosis. --External carotid artery: No acute abnormality. VERTEBRAL ARTERIES: Left dominant configuration. Both origins are clearly patent. No dissection, occlusion or flow-limiting stenosis to the skull base (V1-V3 segments). CTA HEAD FINDINGS POSTERIOR CIRCULATION: --Vertebral arteries: The right vertebral artery terminates in PICA. Left vertebral artery V4 segment is normal. --Posterior inferior cerebellar arteries (PICA): Normal --Anterior inferior cerebellar arteries (AICA): Patent origins from the basilar artery. --Basilar artery: Normal. --Superior cerebellar arteries: Normal. --Posterior cerebral arteries: Normal. The right PCA is predominantly supplied by the posterior communicating artery. ANTERIOR CIRCULATION: --Intracranial internal carotid arteries: Normal. --Anterior cerebral arteries (ACA): Normal. Both A1 segments are present. Patent anterior communicating artery (a-comm). --Middle cerebral arteries (MCA): On the right, there is occlusion of a mid to distal right M2 branch (series 11, image 24 and series 12, image 12). VENOUS SINUSES: As permitted by contrast timing, patent. ANATOMIC VARIANTS: None Review of the MIP images confirms the above findings. CT Brain Perfusion Findings: ASPECTS: 8 CBF (<30%) Volume: 70mL Perfusion (Tmax>6.0s) volume: 23mL Mismatch Volume: 38mL Infarction Location:Right MCA territory, predominantly the right frontal operculum IMPRESSION: 1. Occlusion of a mid-distal right M2  branch, corresponding to the area of ischemia on the perfusion scan. These results were called by telephone at the time of interpretation on  03/26/2019 at 7:56 pm to Dr.ERIC [facility] , who verbally acknowledged these results. 2. 5 mL right MCA territory infarction, predominantly in the right frontal operculum, with 22 mL area of ischemic penumbra. 3. No hemodynamically significant stenosis of the carotid or vertebral arteries by the NASCET criteria. Aortic Atherosclerosis (ICD10-I70.0). Electronically Signed   By: Ulyses Jarred M.D.   On: 03/26/2019 20:07   Ct Cervical Spine Wo Contrast  Result Date: 03/27/2019 CLINICAL DATA:  Fall and acute stroke EXAM: CT CERVICAL SPINE WITHOUT CONTRAST TECHNIQUE: Multidetector CT imaging of the cervical spine was performed without intravenous contrast. Multiplanar CT image reconstructions were also generated. COMPARISON:  None. FINDINGS: Alignment: No static subluxation. Facets are aligned. Occipital condyles and the lateral masses of C1 and C2 are normally approximated. Skull base and vertebrae: No acute fracture. Soft tissues and spinal canal: No prevertebral fluid or swelling. No visible canal hematoma. Disc levels: No advanced spinal canal or neural foraminal stenosis. Marked hypertrophy of the C1-2 level. Disc space narrowing is greatest at C5-6. Multilevel facet hypertrophy. Upper chest: No pneumothorax, pulmonary nodule or pleural effusion. Other: Normal visualized paraspinal cervical soft tissues. IMPRESSION: No acute fracture or static subluxation of the cervical spine. Electronically Signed   By: Ulyses Jarred M.D.   On: 03/27/2019 01:32   Mr Brain Wo Contrast  Result Date: 03/27/2019 CLINICAL DATA:  Slurred speech with left-sided facial droop EXAM: MRI HEAD WITHOUT CONTRAST TECHNIQUE: Multiplanar, multiecho pulse sequences of the brain and surrounding structures were obtained without intravenous contrast. COMPARISON:  Head CT 03/26/2019 FINDINGS: BRAIN: Multifocal  acute ischemia throughout the right MCA territory, greatest along the right precentral gyrus and within the inferior lateral right frontal lobe. Moderate ischemia the right basal ganglia. Multifocal white matter hyperintensity, most commonly due to chronic ischemic microangiopathy. The cerebral and cerebellar volume are age-appropriate. There is no hydrocephalus. The midline structures are normal. No acute hemorrhage. VASCULAR: The major intracranial arterial and venous sinus flow voids are normal. Susceptibility-sensitive sequences show no chronic microhemorrhage or superficial siderosis. SKULL AND UPPER CERVICAL SPINE: Calvarial bone marrow signal is normal. There is no skull base mass. The visualized upper cervical spine and soft tissues are normal. SINUSES/ORBITS: There are no fluid levels or advanced mucosal thickening. The mastoid air cells and middle ear cavities are free of fluid. The orbits are normal. IMPRESSION: Multifocal ischemic infarction of the right MCA territory without acute hemorrhage or mass effect. Electronically Signed   By: Ulyses Jarred M.D.   On: 03/27/2019 02:09   Ct Code Stroke Cta Cerebral Perfusion W/wo Contrast  Result Date: 03/26/2019 CLINICAL DATA:  Left-sided weakness EXAM: CT ANGIOGRAPHY HEAD AND NECK CT PERFUSION BRAIN TECHNIQUE: Multidetector CT imaging of the head and neck was performed using the standard protocol during bolus administration of intravenous contrast. Multiplanar CT image reconstructions and MIPs were obtained to evaluate the vascular anatomy. Carotid stenosis measurements (when applicable) are obtained utilizing NASCET criteria, using the distal internal carotid diameter as the denominator. Multiphase CT imaging of the brain was performed following IV bolus contrast injection. Subsequent parametric perfusion maps were calculated using RAPID software. CONTRAST:  137mL OMNIPAQUE IOHEXOL 350 MG/ML SOLN COMPARISON:  Head CT 03/26/2019 FINDINGS: CTA NECK FINDINGS  SKELETON: Multilevel degenerative disc disease and facet hypertrophy without bony spinal canal stenosis. OTHER NECK: Normal pharynx, larynx and major salivary glands. No cervical lymphadenopathy. Unremarkable thyroid gland. UPPER CHEST: No pneumothorax or pleural effusion. No nodules or masses. AORTIC ARCH: There is mild calcific atherosclerosis of the aortic  arch. There is no aneurysm, dissection or hemodynamically significant stenosis of the visualized ascending aorta and aortic arch. Normal variant aortic arch branching pattern with the brachiocephalic and left common carotid arteries sharing a common origin. The visualized proximal subclavian arteries are widely patent. RIGHT CAROTID SYSTEM: --Common carotid artery: Widely patent origin without common carotid artery dissection or aneurysm. --Internal carotid artery: No dissection, occlusion or aneurysm. Mild atherosclerotic calcification at the carotid bifurcation without hemodynamically significant stenosis. --External carotid artery: No acute abnormality. LEFT CAROTID SYSTEM: --Common carotid artery: Widely patent origin without common carotid artery dissection or aneurysm. --Internal carotid artery: No dissection, occlusion or aneurysm. Mild atherosclerotic calcification at the carotid bifurcation without hemodynamically significant stenosis. --External carotid artery: No acute abnormality. VERTEBRAL ARTERIES: Left dominant configuration. Both origins are clearly patent. No dissection, occlusion or flow-limiting stenosis to the skull base (V1-V3 segments). CTA HEAD FINDINGS POSTERIOR CIRCULATION: --Vertebral arteries: The right vertebral artery terminates in PICA. Left vertebral artery V4 segment is normal. --Posterior inferior cerebellar arteries (PICA): Normal --Anterior inferior cerebellar arteries (AICA): Patent origins from the basilar artery. --Basilar artery: Normal. --Superior cerebellar arteries: Normal. --Posterior cerebral arteries: Normal. The  right PCA is predominantly supplied by the posterior communicating artery. ANTERIOR CIRCULATION: --Intracranial internal carotid arteries: Normal. --Anterior cerebral arteries (ACA): Normal. Both A1 segments are present. Patent anterior communicating artery (a-comm). --Middle cerebral arteries (MCA): On the right, there is occlusion of a mid to distal right M2 branch (series 11, image 24 and series 12, image 12). VENOUS SINUSES: As permitted by contrast timing, patent. ANATOMIC VARIANTS: None Review of the MIP images confirms the above findings. CT Brain Perfusion Findings: ASPECTS: 8 CBF (<30%) Volume: 33mL Perfusion (Tmax>6.0s) volume: 58mL Mismatch Volume: 79mL Infarction Location:Right MCA territory, predominantly the right frontal operculum IMPRESSION: 1. Occlusion of a mid-distal right M2 branch, corresponding to the area of ischemia on the perfusion scan. These results were called by telephone at the time of interpretation on 03/26/2019 at 7:56 pm to Dr.ERIC [facility] , who verbally acknowledged these results. 2. 5 mL right MCA territory infarction, predominantly in the right frontal operculum, with 22 mL area of ischemic penumbra. 3. No hemodynamically significant stenosis of the carotid or vertebral arteries by the NASCET criteria. Aortic Atherosclerosis (ICD10-I70.0). Electronically Signed   By: Ulyses Jarred M.D.   On: 03/26/2019 20:07   Dg Chest Port 1 View  Result Date: 03/31/2019 CLINICAL DATA:  Follow-up pneumonia EXAM: PORTABLE CHEST 1 VIEW COMPARISON:  03/29/2019 FINDINGS: Cardiac shadow is mildly prominent but stable. Aortic calcifications are again seen. Improved aeration is noted in the left lung base with some residual atelectasis noted. No focal confluent infiltrate is seen. No acute bony abnormality is noted. IMPRESSION: Improved aeration in the left base with residual mild atelectasis noted. Electronically Signed   By: Inez Catalina M.D.   On: 03/31/2019 20:01   Dg Chest Port 1 View  Result  Date: 03/29/2019 CLINICAL DATA:  Respiratory failure. EXAM: PORTABLE CHEST 1 VIEW COMPARISON:  03/28/2019 FINDINGS: Patient is rotated to the left. Lungs are adequately inflated with minimal hazy left base density which may be due to small effusion/atelectasis. Cardiomediastinal silhouette and remainder of the exam is unchanged. IMPRESSION: Minimal hazy left base opacification which may be due to small effusion/atelectasis. Electronically Signed   By: Marin Olp M.D.   On: 03/29/2019 10:09   Dg Chest Port 1 View  Result Date: 03/28/2019 CLINICAL DATA:  Abnormal respirations.  History of stroke. EXAM: PORTABLE CHEST 1 VIEW COMPARISON:  Single-view of the chest 03/26/2019. FINDINGS: Endotracheal tube has been removed. Mild right basilar atelectasis is seen. Lungs are otherwise clear. Cardiomegaly. Atherosclerosis. No pneumothorax or pleural effusion. IMPRESSION: Status post extubation. Cardiomegaly without acute disease. Right basilar atelectasis noted. Electronically Signed   By: Inge Rise M.D.   On: 03/28/2019 08:51   Dg Chest Port 1 View  Result Date: 03/26/2019 CLINICAL DATA:  Stroke EXAM: PORTABLE CHEST 1 VIEW COMPARISON:  None. FINDINGS: Endotracheal tube is 4 cm above the carina. NG tube is in the stomach. Heart is normal size. Increased markings throughout the lungs, likely chronic/scarring. No confluent opacities or effusions. IMPRESSION: Chronic changes.  No acute cardiopulmonary disease. Electronically Signed   By: Rolm Baptise M.D.   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 Electronically Signed   By: Margarette [facility] M.D.   On: 03/30/2019 15:11   Result Date: 03/30/2019 CLINICAL DATA:  Generalized LEFT shoulder pain.  Limited mobility. EXAM: LEFT SHOULDER - 2+ VIEW COMPARISON:  None. FINDINGS: High-riding humeral head and  MEDIAL position of the humeral head is unchanged from prior radiographs. Moderate to severe degenerative changes at the glenohumeral joint noted. No acute fracture noted. No suspicious focal bony lesions present. IMPRESSION: Unchanged high-riding and MEDIAL position of the humeral head with moderate to severe degenerative changes. It is difficult to determine if the humeral head is located on this study, but not acute. Electronically Signed: By: Margarette [facility] M.D. On: 03/30/2019 14:59   Dg Swallowing Func-speech Pathology  Result Date: 03/28/2019 Objective Swallowing Evaluation: Type of Study: MBS-Modified Barium Swallow Study  Patient Details Name: Caneshia Goughnour MRN: [id number] Date of Birth: 1930/12/13 Today's Date: 03/28/2019 Time: SLP Start Time (ACUTE ONLY): [id number] -SLP Stop Time (ACUTE ONLY): 1323 SLP Time Calculation (min) (ACUTE ONLY): 20 min Past Medical History: No past medical history on file. Past Surgical History: Past Surgical History: Procedure Laterality Date  RADIOLOGY WITH ANESTHESIA Left 03/26/2019  Procedure: RADIOLOGY WITH ANESTHESIA;  Surgeon: Luanne Bras, MD;  Location: [facility];  Service: Radiology;  Laterality: Left; HPI: Pt is an 85 y.o. female who lives independently and presented to teh ED secondary to fall from a standing position, slurred speech, left sided facial droop, right gaze preference and left arm weakness. MRI of the brain showed multifocal ischemic infarction of the right MCA territory without acute hemorrhage or mass effect. Intubated 9/9-9/10.  No data recorded Assessment / Plan / Recommendation CHL IP CLINICAL IMPRESSIONS 03/28/2019 Clinical Impression Mild-moderate oropharyngeal dysphagia with decreased oral cohesion, laryngeal penetration and residual. Control and propulsion of boluses was challenging for pt and she used head extention to assist in bolus transfer to oropharynx. Anterior leakage on left side and lingual residue spilling into pharynx, laryngeal vestibule  reaching vocal cords without adequate sensation to expel. Nectar thick consistently penetrated due to inadequate timing and incomplete epiglottic inversion. Cues for cough were ineffective to remove. Vallecular residue ranged mild to max and mild in pyriform sinuses. Of note pt was sleepy this afternoon in comparison to bedside assessment. Dys 1 (puree), honey thick recommended, with assist, two swallows and pills whole in puree. Son educated in room after study and voiced understanding of recommendations.       SLP Visit Diagnosis Dysphagia, oropharyngeal phase (R13.12) Attention and concentration deficit following -- Frontal lobe and executive function deficit following -- Impact  on safety and function Moderate aspiration risk   CHL IP TREATMENT RECOMMENDATION 03/28/2019 Treatment Recommendations Therapy as outlined in treatment plan below   Prognosis 03/28/2019 Prognosis for Safe Diet Advancement Good Barriers to Reach Goals -- Barriers/Prognosis Comment -- CHL IP DIET RECOMMENDATION 03/28/2019 SLP Diet Recommendations Dysphagia 1 (Puree) solids;Honey thick liquids Liquid Administration via Cup Medication Administration Whole meds with puree Compensations Slow rate;Small sips/bites;Multiple dry swallows after each bite/sip;Lingual sweep for clearance of pocketing Postural Changes Seated upright at 90 degrees   CHL IP OTHER RECOMMENDATIONS 03/28/2019 Recommended Consults -- Oral Care Recommendations Oral care BID Other Recommendations --   CHL IP FOLLOW UP RECOMMENDATIONS 03/28/2019 Follow up Recommendations 24 hour supervision/assistance   CHL IP FREQUENCY AND DURATION 03/28/2019 Speech Therapy Frequency (ACUTE ONLY) min 2x/week Treatment Duration 2 weeks      CHL IP ORAL PHASE 03/28/2019 Oral Phase Impaired Oral - Pudding Teaspoon -- Oral - Pudding Cup -- Oral - Honey Teaspoon Left anterior bolus loss;Weak lingual manipulation;Reduced posterior propulsion;Delayed oral transit Oral - Honey Cup Left anterior bolus  loss;Weak lingual manipulation;Reduced posterior propulsion;Delayed oral transit Oral - Nectar Teaspoon -- Oral - Nectar Cup Lingual/palatal residue;Weak lingual manipulation;Left anterior bolus loss;Reduced posterior propulsion;Decreased bolus cohesion;Delayed oral transit Oral - Nectar Straw -- Oral - Thin Teaspoon -- Oral - Thin Cup -- Oral - Thin Straw -- Oral - Puree Decreased bolus cohesion;Delayed oral transit;Reduced posterior propulsion Oral - Mech Soft -- Oral - Regular -- Oral - Multi-Consistency -- Oral - Pill -- Oral Phase - Comment --  CHL IP PHARYNGEAL PHASE 03/28/2019 Pharyngeal Phase Impaired Pharyngeal- Pudding Teaspoon -- Pharyngeal -- Pharyngeal- Pudding Cup -- Pharyngeal -- Pharyngeal- Honey Teaspoon Pharyngeal residue - valleculae;Pharyngeal residue - pyriform Pharyngeal -- Pharyngeal- Honey Cup Penetration/Aspiration during swallow;Pharyngeal residue - valleculae;Pharyngeal residue - pyriform Pharyngeal Material enters airway, remains ABOVE vocal cords and not ejected out Pharyngeal- Nectar Teaspoon -- Pharyngeal -- Pharyngeal- Nectar Cup Pharyngeal residue - pyriform;Pharyngeal residue - valleculae;Penetration/Aspiration before swallow Pharyngeal Material enters airway, remains ABOVE vocal cords then ejected out Pharyngeal- Nectar Straw -- Pharyngeal -- Pharyngeal- Thin Teaspoon -- Pharyngeal -- Pharyngeal- Thin Cup -- Pharyngeal -- Pharyngeal- Thin Straw -- Pharyngeal -- Pharyngeal- Puree Pharyngeal residue - valleculae;Delayed swallow initiation-pyriform sinuses Pharyngeal -- Pharyngeal- Mechanical Soft -- Pharyngeal -- Pharyngeal- Regular -- Pharyngeal -- Pharyngeal- Multi-consistency -- Pharyngeal -- Pharyngeal- Pill -- Pharyngeal -- Pharyngeal Comment --  CHL IP CERVICAL ESOPHAGEAL PHASE 03/28/2019 Cervical Esophageal Phase WFL Pudding Teaspoon -- Pudding Cup -- Honey Teaspoon -- Honey Cup -- Nectar Teaspoon -- Nectar Cup -- Nectar Straw -- Thin Teaspoon -- Thin Cup -- Thin Straw -- Puree  -- Mechanical Soft -- Regular -- Multi-consistency -- Pill -- Cervical Esophageal Comment -- [place] Siren 03/28/2019, 3:11 PM Orbie Pyo Litaker M.Ed Actor Pager [phone] Office [phone]              Ct Head Code Stroke Wo Contrast  Result Date: 03/26/2019 CLINICAL DATA:  Code stroke.  Slurred speech EXAM: CT HEAD WITHOUT CONTRAST TECHNIQUE: Contiguous axial images were obtained from the base of the skull through the vertex without intravenous contrast. COMPARISON:  None. FINDINGS: Brain: There is no mass, hemorrhage or extra-axial collection. The size and configuration of the ventricles and extra-axial CSF spaces are normal. There is hypoattenuation of the periventricular white matter, most commonly indicating chronic ischemic microangiopathy. Vascular: No abnormal hyperdensity of the major intracranial arteries or dural venous sinuses. No intracranial atherosclerosis. Skull: The visualized skull base, calvarium and  extracranial soft tissues are normal. Sinuses/Orbits: No fluid levels or advanced mucosal thickening of the visualized paranasal sinuses. No mastoid or middle ear effusion. The orbits are normal. ASPECTS [facility] Stroke Program Early CT Score) - Ganglionic level infarction (caudate, lentiform nuclei, internal capsule, insula, M1-M3 cortex): 5 - Supraganglionic infarction (M4-M6 cortex): 3 Total score (0-10 with 10 being normal): 8 IMPRESSION: 1. No intracranial hemorrhage. 2. ASPECTS is 8 These results were called by telephone at the time of interpretation on 03/26/2019 at 7:46 pm to provider Dr. Kerney Elbe, who verbally acknowledged these results. Electronically Signed   By: Ulyses Jarred M.D.   On: 03/26/2019 19:48   Vas [place] Lower Extremity Venous (dvt)  Result Date: 03/27/2019  Lower Venous Study Indications: Stroke.  Comparison Study: No priors. Performing Technologist: Oda Cogan RDMS, RVT  Examination Guidelines: A complete evaluation includes  B-mode imaging, spectral Doppler, color Doppler, and power Doppler as needed of all accessible portions of each vessel. Bilateral testing is considered an integral part of a complete examination. Limited examinations for reoccurring indications may be performed as noted.  +---------+---------------+---------+-----------+----------+--------------+  RIGHT     Compressibility Phasicity Spontaneity Properties Thrombus Aging  +---------+---------------+---------+-----------+----------+--------------+  CFV       Full            Yes       Yes                                    +---------+---------------+---------+-----------+----------+--------------+  SFJ       Full                                                             +---------+---------------+---------+-----------+----------+--------------+  FV Prox   Full                                                             +---------+---------------+---------+-----------+----------+--------------+  FV Mid    Full                                                             +---------+---------------+---------+-----------+----------+--------------+  FV Distal Full                                                             +---------+---------------+---------+-----------+----------+--------------+  PFV       Full                                                             +---------+---------------+---------+-----------+----------+--------------+  POP       Full            Yes       Yes                                    +---------+---------------+---------+-----------+----------+--------------+  PTV       Full                                                             +---------+---------------+---------+-----------+----------+--------------+  PERO      Full                                                             +---------+---------------+---------+-----------+----------+--------------+   +---------+---------------+---------+-----------+----------+--------------+   LEFT      Compressibility Phasicity Spontaneity Properties Thrombus Aging  +---------+---------------+---------+-----------+----------+--------------+  CFV       Full            Yes       Yes                                    +---------+---------------+---------+-----------+----------+--------------+  SFJ       Full                                                             +---------+---------------+---------+-----------+----------+--------------+  FV Prox   Full                                                             +---------+---------------+---------+-----------+----------+--------------+  FV Mid    Full                                                             +---------+---------------+---------+-----------+----------+--------------+  FV Distal Full                                                             +---------+---------------+---------+-----------+----------+--------------+  PFV       Full                                                             +---------+---------------+---------+-----------+----------+--------------+  POP       Full            Yes       Yes                                    +---------+---------------+---------+-----------+----------+--------------+  PTV       Full                                                             +---------+---------------+---------+-----------+----------+--------------+  PERO      Full                                                             +---------+---------------+---------+-----------+----------+--------------+     Summary: Right: There is no evidence of deep vein thrombosis in the lower extremity. No cystic structure found in the popliteal fossa. Left: There is no evidence of deep vein thrombosis in the lower extremity. A cystic structure is found in the popliteal fossa.  *See table(s) above for measurements and observations. Electronically signed by Monica Martinez MD on 03/27/2019 at 3:20:28 PM.    Final    Ir Angio Intra  Extracran Sel Com Carotid Innominate Uni R Mod Sed  Result Date: 03/31/2019 CLINICAL DATA:  Acute onset of left-sided neglect. Left-sided weakness. Suspected occlusion of the superior division of the right middle cerebral artery M2 mid to distal region. EXAM: IR ANGIO VERTEBRAL SEL SUBCLAVIAN INNOMINATE UNI RIGHT MOD SED; IR ANGIO INTRA EXTRACRAN SEL COM CAROTID INNOMINATE UNI RIGHT MOD SED COMPARISON:  CT angiogram of the head and neck of 03/26/2019. MEDICATIONS: Heparin 0 units IV; 2 g Ancef antibiotic was administered within 1 hour of the procedure. ANESTHESIA/SEDATION: General anesthesia. CONTRAST:  Isovue 300 approximately 30 cc. FLUOROSCOPY TIME:  Fluoroscopy Time: 2 minutes 42 seconds (242 mGy). COMPLICATIONS: None immediate. TECHNIQUE: Informed written consent was obtained from the patient's son after a thorough discussion of the procedural risks, benefits and alternatives. All questions were addressed. Maximal Sterile Barrier Technique was utilized including caps, mask, sterile gowns, sterile gloves, sterile drape, hand hygiene and skin antiseptic. A timeout was performed prior to the initiation of the procedure. The right groin was prepped and draped in the usual sterile fashion. Thereafter using modified Seldinger technique, transfemoral access into the right common femoral artery was obtained without difficulty. Over a 0.035 inch guidewire, a 5 French Pinnacle sheath was inserted. Through this, and also over 0.035 inch guidewire, a 5 [place] JB 1 catheter was advanced to the aortic arch region and selectively positioned in the innominate artery and the right common carotid artery. FINDINGS: The innominate artery angiogram demonstrates the origins of the right common carotid artery and the right subclavian artery to be widely patent. The visualized portion of the right vertebral artery at its origin and distally demonstrates wide patency. The vessel is seen to opacify to the cranial skull base. The right  common carotid arteriogram demonstrates the right external carotid artery and its major branches to be widely patent. The right internal carotid  artery at the bulb to the cranial skull base demonstrates wide patency. Petrous, cavernous and the supraclinoid segments are widely patent. A right posterior communicating artery is seen opacifying the right posterior cerebral artery distribution. The right middle cerebral artery, and the right anterior cerebral artery opacify into the capillary and venous phases. The mid to delayed arterial phase demonstrates angiographic occlusion of an anterior perisylvian branch of the superior division of the right middle cerebral artery in the distal M2 M3 junction. 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 Given the size of this vessel distally endovascular revascularization is felt to be too risky given the availability of the present stent retrievers. PLAN: As per referring MD. Electronically Signed   By: Luanne Bras M.D.   On: 03/27/2019 09:43   Ir Angio Vertebral Sel Subclavian Innominate Uni R Mod Sed  Result Date: 03/31/2019 CLINICAL DATA:  Acute onset of left-sided neglect. Left-sided weakness. Suspected occlusion of the superior division of the right middle cerebral artery M2 mid to distal region. EXAM: IR ANGIO VERTEBRAL SEL SUBCLAVIAN INNOMINATE UNI RIGHT MOD SED; IR ANGIO INTRA EXTRACRAN SEL COM CAROTID INNOMINATE UNI RIGHT MOD SED COMPARISON:  CT angiogram of the head and neck of 03/26/2019. MEDICATIONS: Heparin 0 units IV; 2 g Ancef antibiotic was administered within 1 hour of the procedure. ANESTHESIA/SEDATION: General anesthesia. CONTRAST:  Isovue 300 approximately 30 cc. FLUOROSCOPY TIME:  Fluoroscopy Time: 2 minutes 42 seconds (242 mGy). COMPLICATIONS: None immediate. TECHNIQUE: Informed written consent was obtained from the patient's son after a  thorough discussion of the procedural risks, benefits and alternatives. All questions were addressed. Maximal Sterile Barrier Technique was utilized including caps, mask, sterile gowns, sterile gloves, sterile drape, hand hygiene and skin antiseptic. A timeout was performed prior to the initiation of the procedure. The right groin was prepped and draped in the usual sterile fashion. Thereafter using modified Seldinger technique, transfemoral access into the right common femoral artery was obtained without difficulty. Over a 0.035 inch guidewire, a 5 French Pinnacle sheath was inserted. Through this, and also over 0.035 inch guidewire, a 5 [place] JB 1 catheter was advanced to the aortic arch region and selectively positioned in the innominate artery and the right common carotid artery. FINDINGS: The innominate artery angiogram demonstrates the origins of the right common carotid artery and the right subclavian artery to be widely patent. The visualized portion of the right vertebral artery at its origin and distally demonstrates wide patency. The vessel is seen to opacify to the cranial skull base. The right common carotid arteriogram demonstrates the right external carotid artery and its major branches to be widely patent. The right internal carotid artery at the bulb to the cranial skull base demonstrates wide patency. Petrous, cavernous and the supraclinoid segments are widely patent. A right posterior communicating artery is seen opacifying the right posterior cerebral artery distribution. The right middle cerebral artery, and the right anterior cerebral artery opacify into the capillary and venous phases. The mid to delayed arterial phase demonstrates angiographic occlusion of an anterior perisylvian branch of the superior division of the right middle cerebral artery in the distal M2 M3 junction. IMPRESSION: Angiographic occlusion of an anterior perisylvian branch of the superior division of the right middle  cerebral artery in the M2 M3 region. Given the size of this vessel distally endovascular revascularization is felt to be too risky given the availability of the present stent  retrievers. PLAN: As per referring MD. Electronically Signed   By: Luanne Bras M.D.   On: 03/27/2019 09:43    Time Spent in minutes  30   Lala Lund M.D on 04/02/2019 at 10:10 AM  To page go to [email or web address] - password [facility]

## 2019-04-02 NOTE — Progress Notes (Signed)
Pt's son concerned about pt not having PT and pt not receiving Celebrex. Will communicate this to on-coming nurse to follow-up.

## 2019-04-02 NOTE — Progress Notes (Signed)
STROKE TEAM PROGRESS NOTE   INTERVAL HISTORY Pt sitting in bed, her son is  at bedside.  She is stable and plan to transfer to SNF today Vitals:   04/01/19 2028 04/02/19 0026 04/02/19 0356 04/02/19 0700  BP: (!) 143/78 134/68 129/65 140/87  Pulse:  78 75   Resp: 18  20 20   Temp: 99.2 F (37.3 C) 98.6 F (37 C) 98.8 F (37.1 C) 98.2 F (36.8 C)  TempSrc: Oral Oral  Oral  SpO2: 97% 99% 100%   Weight:      Height:        CBC:  Recent Labs  Lab 03/27/19 0235  03/31/19 1633 04/01/19 0321  WBC 16.1*   < > 13.6* 13.3*  NEUTROABS 12.9*  --  9.8*  --   HGB 13.5   < > 12.7 11.8*  HCT 40.7   < > 35.9* 35.8*  MCV 92.1   < > 90.2 92.3  PLT 202   < > 209 239   < > = values in this interval not displayed.    Basic Metabolic Panel:  Recent Labs  Lab 03/28/19 0154 03/29/19 0340  03/31/19 1633 04/01/19 0321  NA 137 136   < > 133* 137  K 4.0 3.7   < > 3.2* 3.9  CL 106 105   < > 102 106  CO2 23 21*   < > 22 20*  GLUCOSE 102* 89   < > 169* 100*  BUN 14 14   < > 14 19  CREATININE 0.81 0.76   < > 0.87 0.78  CALCIUM 8.2* 8.0*   < > 7.8* 7.7*  MG 1.9 2.1  --   --   --   PHOS 3.1 2.1*  --   --   --    < > = values in this interval not displayed.   Lipid Panel:     Component Value Date/Time   CHOL 228 (H) 03/27/2019 0235   TRIG 56 03/28/2019 0154   HDL 133 03/27/2019 0235   CHOLHDL 1.7 03/27/2019 0235   VLDL 15 03/27/2019 0235   LDLCALC 80 03/27/2019 0235   HgbA1c:  Lab Results  Component Value Date   HGBA1C 5.5 03/27/2019   Urine Drug Screen:     Component Value Date/Time   LABOPIA NONE DETECTED 03/26/2019 2030   COCAINSCRNUR NONE DETECTED 03/26/2019 2030   LABBENZ NONE DETECTED 03/26/2019 2030   AMPHETMU NONE DETECTED 03/26/2019 2030   THCU NONE DETECTED 03/26/2019 2030   LABBARB NONE DETECTED 03/26/2019 2030    Alcohol Level     Component Value Date/Time   [facility] <10 03/26/2019 1858    IMAGING  Dg Chest Port 1 View 03/29/2019 IMPRESSION:  Minimal hazy  left base opacification which may be due to small effusion/atelectasis.  Dg Chest Port 1 View 03/28/2019 IMPRESSION:  Status post extubation. Cardiomegaly without acute disease. Right basilar atelectasis noted.  Dg Swallowing Func-speech Pathology 03/28/2019 Objective Swallowing Evaluation: Type of Study: MBS-Modified Barium Swallow Study  Patient Details Name: Nancy Zamora MRN: [id number] Date of Birth: [birth year] Today's Date: 03/28/2019 Time: SLP Start Time (ACUTE ONLY): [id number] -SLP Stop Time (ACUTE ONLY): 1323 SLP Time Calculation (min) (ACUTE ONLY): 20 min Past Medical History: No past medical history on file. Past Surgical History: Past Surgical History: Procedure Laterality Date . RADIOLOGY WITH ANESTHESIA Left 03/26/2019  Procedure: RADIOLOGY WITH ANESTHESIA;  Surgeon: Luanne Bras, MD;  Location: [place];  Service: Radiology;  Laterality: Left; HPI: Pt is  an 85 y.o. female who lives independently and presented to teh ED secondary to fall from a standing position, slurred speech, left sided facial droop, right gaze preference and left arm weakness. MRI of the brain showed multifocal ischemic infarction of the right MCA territory without acute hemorrhage or mass effect. Intubated 9/9-9/10.  No data recorded Assessment / Plan / Recommendation CHL IP CLINICAL IMPRESSIONS 03/28/2019 Clinical Impression Mild-moderate oropharyngeal dysphagia with decreased oral cohesion, laryngeal penetration and residual. Control and propulsion of boluses was challenging for pt and she used head extention to assist in bolus transfer to oropharynx. Anterior leakage on left side and lingual residue spilling into pharynx, laryngeal vestibule reaching vocal cords without adequate sensation to expel. Nectar thick consistently penetrated due to inadequate timing and incomplete epiglottic inversion. Cues for cough were ineffective to remove. Vallecular residue ranged mild to max and mild in pyriform sinuses. Of note pt was sleepy  this afternoon in comparison to bedside assessment. Dys 1 (puree), honey thick recommended, with assist, two swallows and pills whole in puree. Son educated in room after study and voiced understanding of recommendations.       SLP Visit Diagnosis Dysphagia, oropharyngeal phase (R13.12) Attention and concentration deficit following -- Frontal lobe and executive function deficit following -- Impact on safety and function Moderate aspiration risk   CHL IP TREATMENT RECOMMENDATION 03/28/2019 Treatment Recommendations Therapy as outlined in treatment plan below   Prognosis 03/28/2019 Prognosis for Safe Diet Advancement Good Barriers to Reach Goals -- Barriers/Prognosis Comment -- CHL IP DIET RECOMMENDATION 03/28/2019 SLP  Diet Recommendations Dysphagia 1 (Puree) solids;Honey thick liquids Liquid Administration via Cup Medication Administration Whole meds with puree Compensations Slow rate;Small sips/bites;Multiple dry swallows after each bite/sip;Lingual sweep for clearance of pocketing Postural Changes Seated upright at 90 degrees   CHL IP OTHER RECOMMENDATIONS 03/28/2019 Recommended Consults -- Oral Care Recommendations Oral care BID Other Recommendations --   CHL IP FOLLOW UP RECOMMENDATIONS 03/28/2019 Follow up Recommendations 24 hour supervision/assistance   CHL IP FREQUENCY AND DURATION 03/28/2019 Speech Therapy Frequency (ACUTE ONLY) min 2x/week Treatment Duration 2 weeks      CHL IP ORAL PHASE 03/28/2019 Oral Phase Impaired Oral - Pudding Teaspoon -- Oral - Pudding Cup -- Oral - Honey Teaspoon Left anterior bolus loss;Weak lingual manipulation;Reduced posterior propulsion;Delayed oral transit Oral - Honey Cup Left anterior bolus loss;Weak lingual manipulation;Reduced posterior propulsion;Delayed oral transit Oral - Nectar Teaspoon -- Oral - Nectar Cup Lingual/palatal residue;Weak lingual manipulation;Left anterior bolus loss;Reduced posterior propulsion;Decreased bolus cohesion;Delayed oral transit Oral - Nectar Straw  -- Oral - Thin Teaspoon -- Oral - Thin Cup -- Oral - Thin Straw -- Oral - Puree Decreased bolus cohesion;Delayed oral transit;Reduced posterior propulsion Oral - Mech Soft -- Oral - Regular -- Oral - Multi-Consistency -- Oral - Pill -- Oral Phase - Comment --  CHL IP PHARYNGEAL PHASE 03/28/2019 Pharyngeal Phase Impaired Pharyngeal- Pudding Teaspoon -- Pharyngeal -- Pharyngeal- Pudding Cup -- Pharyngeal -- Pharyngeal- Honey Teaspoon Pharyngeal residue - valleculae;Pharyngeal residue - pyriform Pharyngeal -- Pharyngeal- Honey Cup Penetration/Aspiration during swallow;Pharyngeal residue - valleculae;Pharyngeal residue - pyriform Pharyngeal Material enters airway, remains ABOVE vocal cords and not ejected out Pharyngeal- Nectar Teaspoon -- Pharyngeal -- Pharyngeal- Nectar Cup Pharyngeal residue - pyriform;Pharyngeal residue - valleculae;Penetration/Aspiration before swallow Pharyngeal Material enters airway, remains ABOVE vocal cords then ejected out Pharyngeal- Nectar Straw -- Pharyngeal -- Pharyngeal- Thin Teaspoon -- Pharyngeal -- Pharyngeal- Thin Cup -- Pharyngeal -- Pharyngeal- Thin Straw -- Pharyngeal -- Pharyngeal- Puree Pharyngeal residue - valleculae;Delayed  swallow initiation-pyriform sinuses Pharyngeal -- Pharyngeal- Mechanical Soft -- Pharyngeal -- Pharyngeal- Regular -- Pharyngeal -- Pharyngeal- Multi-consistency -- Pharyngeal -- Pharyngeal- Pill -- Pharyngeal -- Pharyngeal Comment --  CHL IP CERVICAL ESOPHAGEAL PHASE 03/28/2019 Cervical Esophageal Phase WFL Pudding Teaspoon -- Pudding Cup -- Honey Teaspoon -- Honey Cup -- Nectar Teaspoon -- Nectar Cup -- Nectar Straw -- Thin Teaspoon -- Thin Cup -- Thin Straw -- Puree -- Mechanical Soft -- Regular -- Multi-consistency -- Pill -- Cervical Esophageal Comment -- [place] Siren 03/28/2019, 3:11 PM Nancy Zamora M.Ed Actor Pager [phone] Office [phone]              Cerebral Angiogram 03/27/2019 Occlusion of RT MCA  superior division ant peri sylvian branch M2/M3 junction.   Left Shoulder X-ray - pending  Left Elbow X-ray - pending   PHYSICAL EXAM    Temp:  [98.2 F (36.8 C)-99.2 F (37.3 C)] 98.2 F (36.8 C) (09/16 0700) Pulse Rate:  [75-83] 75 (09/16 0356) Resp:  [18-20] 20 (09/16 0700) BP: (129-143)/(65-87) 140/87 (09/16 0700) SpO2:  [97 %-100 %] 100 % (09/16 0356)  General - Well nourished, well developed elderly Caucasian lady, no apparent distress.  Ophthalmologic - fundi not visualized due to noncooperation.  Cardiovascular - Regular rate and rhythm.  Neuro -AAO x3, following all simple commands, no aphasia, mild dysarthria, able to name and repeat. With eye opening, eyes in slight right gaze position, left gaze incomplete, visual field full but has left simutagnosia, PERRL.  Left facial droop.  Tongue protrusion midline. RUE 4+/5 and RLE 4+/5. LUE 3/5 and LLE 4/5. DTR 1+ and no babinski. Sensation symmetrical, coordination R FTN intact and gait not tested.  ASSESSMENT/PLAN [name] is a 85 y.o. female with history of HTN, CKD, AV stenosis, PSVT/PVCs, hypothyroidism and peripheral neuropathy presenting following a fall with slurred speech, left sided facial droop, right gaze preference and left arm weakness.   Stroke:  R MCA infarct due to right distal M2 occlusion, embolic secondary to PAF on tele in ICU  Code Stroke CT head No acute abnormality. ASPECTS 8    CTA head & neck occlusion mid-distal R M2 branch. No significant stenosis  CT perfusion 60mL R MCA territory infarct R frontal operculum w. 60mL penumbra  Cerebral angio occlusion R MCA M2/M3 jxn  CT CSpine no fx, subluxation  MRI  Multifocal R MCA territory infarct w/o hemorrhage or mass effect  2D Echo EF >65%. No source of embolus   LE venous doppler no DVT  LDL 80  HgbA1c 5.5  lovenox subq for VTE prophylaxis.   No antithrombotic prior to admission, now on ASA 325mg  and plavix 75. Will consider DOAC  in 3-5 days.   Therapy recommendations:  SNF recommended  Disposition:  pending   PAF with RVR Hx of SVT  Reported on cardizem in the past  PAF with RVR on tele in ICU  Resume cardizam after passing swallow -> Cardizem CD 180 mg daily started 03/28/19  Now on ASA and plavix  Consider DOAC in 3-5 days  Hypertension  Home meds:  No listed home antihypertensives  Treated w/ Cleviprex post angio  BP now Stable . Resume cardizem home meds . Long-term BP goal normotensive  Hyperlipidemia  Home meds:  No statin  LDL 80, goal < 70  Add lipitor 20   Continue statin at discharge  Dysphagia . Secondary to stroke . On puree diet now . Speech on board . Resume home  meds . MBS with speech - see above   Other Stroke Risk Factors  Advanced age  Family hx stroke   Non-rheumatic AV stenosis   Other Active Problems  Fall - CK ok  UA neg, UCx - 03/27/19 - no growth (final)  Trop 58  Hypothyroid, TSH ok  Osteoporosis on denosumab as OP. C-Spine cleared.   Leukocytosis 16.1->15.8->14.6 (afebrile)->12.4  Chest x-ray - 03/29/2019 - Minimal hazy left base opacification which may be due to small effusion/atelectasis.  Left Shoulder X-ray -no fracture Left Elbow X-ray -no fracture Hospital day # 7 Plan transfer to SNF today. Start eliquis. D/w son   Antony Contras, MD Medical Director [place] Pager: [phone] 04/02/2019 3:38 PM To contact Stroke Continuity provider, please refer to [email or web address] After hours, contact General Neurology

## 2019-04-02 NOTE — Progress Notes (Addendum)
Dr. Leonel Ramsay informed of BG 36, asymptomatic. MD questioned to accuracy of gluco-meters. CN informed of the MD's concern and suggestions made aware to CN for possible QCing machines an extra time or having them troubleshoot.

## 2019-04-02 NOTE — Progress Notes (Addendum)
Physical Therapy Treatment Patient Details Name: Nancy Zamora MRN: [id number] DOB: 01/12/1931 Today's Date: 04/02/2019    History of Present Illness Pt is an 85 y.o. female who presents to the ED secondary to fall from a standing position; noted to have slurred speech, left sided facial droop, right gaze preference and left arm weakness. MRI of the brain-multifocal ischemic infarction of the right MCA territory without acute hemorrhage or mass effect. PMH includes HTN, CKD, AV stenosis, PSVT/PVCs, hypothyroidism, peripheral neuropathy, and fx of LUE and LLE.    PT Comments    Mod assist for supine to sit and to pivot to recliner. Attempted sit to stand with RW, pt unable to weight bear through LUE on RW. Instructed pt/son in LUE/LE strengthening exercises. Pt very pleasant and motivated. Noted continued R gaze preference and L sided inattention.    Follow Up Recommendations  SNF;Supervision for mobility/OOB     Equipment Recommendations  Other (comment)(TBD)    Recommendations for Other Services       Precautions / Restrictions Precautions Precautions: Fall Restrictions Weight Bearing Restrictions: No    Mobility  Bed Mobility Overal bed mobility: Needs Assistance Bed Mobility: Supine to Sit     Supine to sit: Mod assist     General bed mobility comments: mod A to raise trunk and pivot hips to edge of bed  Transfers Overall transfer level: Needs assistance Equipment used: None;Rolling walker (2 wheeled) Transfers: Sit to/from [facility] Sit to Stand: Mod assist;From elevated surface Stand pivot transfers: Mod assist       General transfer comment: attempted sit to stand with RW, assist to place L hand on RW, pt stood with flexed trunk and WBing on RLE, unable to WB thru LUE to weight shift to LLE so returned to sitting then performed SPT with "bear hug" technique  Ambulation/Gait Ambulation/Gait assistance: Mod assist Gait Distance (Feet): 2  Feet Assistive device: None   Gait velocity: Decreased   General Gait Details: cues needed for sequencing, able to take a couple of steps from bed to recliner during SPT   Stairs             Wheelchair Mobility    Modified Rankin (Stroke Patients Only)       Balance Overall balance assessment: Needs assistance Sitting-balance support: Feet supported;No upper extremity supported Sitting balance-Leahy Scale: Poor Sitting balance - Comments: posterior leaning this day. Cues to come forward, but patient unable. Requires very close supervision to min assist at all times when seated at edge of bed. Postural control: Posterior lean Standing balance support: Bilateral upper extremity supported;During functional activity Standing balance-Leahy Scale: Poor Standing balance comment: Requires external support in standing; Mod A for static standing balance                            Cognition Arousal/Alertness: Awake/alert Behavior During Therapy: WFL for tasks assessed/performed Overall Cognitive Status: Impaired/Different from baseline Area of Impairment: Orientation;Awareness;Attention;Following commands;Safety/judgement                 Orientation Level: Disoriented to;Situation     Following Commands: Follows one step commands with increased time Safety/Judgement: Decreased awareness of safety     General Comments:  Right gaze preference. Left inattention. Able to gaze midline and to the left but not sustain. Following single step cues consistently       Exercises General Exercises - Upper Extremity Elbow Flexion: AAROM;Left;10 reps;Seated Digit Composite Flexion:  AROM;Left;5 reps;Seated(gripping washcloth roll) General Exercises - Lower Extremity Ankle Circles/Pumps: Both;AROM;10 reps;Seated Quad Sets: AROM;Left;5 reps;Supine Heel Slides: AAROM;Left;10 reps;Seated    General Comments        Pertinent Vitals/Pain Pain Assessment: No/denies  pain    Home Living                      Prior Function            PT Goals (current goals can now be found in the care plan section) Acute Rehab PT Goals Patient Stated Goal: to be able to take care of myself, I am very independent PT Goal Formulation: With patient/family Time For Goal Achievement: 04/11/19 Potential to Achieve Goals: Fair Progress towards PT goals: Progressing toward goals    Frequency    Min 3X/week      PT Plan Current plan remains appropriate    Co-evaluation PT/OT/SLP Co-Evaluation/Treatment: Yes            AM-PAC PT "6 Clicks" Mobility   Outcome Measure  Help needed turning from your back to your side while in a flat bed without using bedrails?: A Little Help needed moving from lying on your back to sitting on the side of a flat bed without using bedrails?: A Lot Help needed moving to and from a bed to a chair (including a wheelchair)?: A Lot Help needed standing up from a chair using your arms (e.g., wheelchair or bedside chair)?: A Lot Help needed to walk in hospital room?: A Lot Help needed climbing 3-5 steps with a railing? : Total 6 Click Score: 12    End of Session Equipment Utilized During Treatment: Gait belt Activity Tolerance: Patient tolerated treatment well Patient left: in chair;with family/visitor present Nurse Communication: Mobility status PT Visit Diagnosis: Unsteadiness on feet (R26.81);Muscle weakness (generalized) (M62.81);Difficulty in walking, not elsewhere classified (R26.2);Hemiplegia and hemiparesis Hemiplegia - Right/Left: Left Hemiplegia - dominant/non-dominant: Non-dominant Hemiplegia - caused by: Cerebral infarction     Time: [id number] PT Time Calculation (min) (ACUTE ONLY): 28 min  Charges:  $Therapeutic Exercise: 8-22 mins $Therapeutic Activity: 8-22 mins                     Blondell Reveal Kistler PT 04/02/2019  Acute Rehabilitation Services Pager [phone] Office  [phone]

## 2019-04-02 NOTE — TOC Transition Note (Signed)
Transition of Care [facility]) - CM/SW Discharge Note   Patient Details  Name: Cythina Hershner MRN: [id number] Date of Birth: 1930/07/23  [facility] CM/SW Contact:  Geralynn Ochs, LCSW Phone Number: 04/02/2019, 1:21 PM   Clinical Narrative:   Nurse to call report to [phone], Room 7009.    Final next level of care: Skilled Nursing Facility Barriers to Discharge: Barriers Resolved   Patient Goals and CMS Choice Patient states their goals for this hospitalization and ongoing recovery are:: Pt and son are agreeable to rehab CMS Medicare.gov Compare Post Acute Care list provided to:: Patient Represenative (must comment) Choice offered to / list presented to : Adult Children  Discharge Placement              Patient chooses bed at: [name] Patient to be transferred to facility by: [facility] Name of family member notified: Vevelyn Royals Patient and family notified of of transfer: 04/01/19  Discharge Plan and Services In-house Referral: Clinical Social Work Discharge Planning Services: NA Post Acute Care Choice: [place]          DME Arranged: N/A DME Agency: NA       HH Arranged: NA HH Agency: NA        Social Determinants of Health (SDOH) Interventions     Readmission Risk Interventions No flowsheet data found.

## 2019-04-03 DIAGNOSIS — K6289 Other specified diseases of anus and rectum: Secondary | ICD-10-CM | POA: Diagnosis not present

## 2019-04-03 DIAGNOSIS — K649 Unspecified hemorrhoids: Secondary | ICD-10-CM | POA: Diagnosis not present

## 2019-04-03 LAB — URINE CULTURE: Culture: >=100000 — AB

## 2019-04-04 DIAGNOSIS — E039 Hypothyroidism, unspecified: Secondary | ICD-10-CM | POA: Diagnosis not present

## 2019-04-04 DIAGNOSIS — D72829 Elevated white blood cell count, unspecified: Secondary | ICD-10-CM | POA: Diagnosis not present

## 2019-04-04 DIAGNOSIS — M19012 Primary osteoarthritis, left shoulder: Secondary | ICD-10-CM | POA: Diagnosis not present

## 2019-04-04 DIAGNOSIS — I63511 Cerebral infarction due to unspecified occlusion or stenosis of right middle cerebral artery: Secondary | ICD-10-CM | POA: Diagnosis not present

## 2019-04-04 DIAGNOSIS — M19011 Primary osteoarthritis, right shoulder: Secondary | ICD-10-CM | POA: Diagnosis not present

## 2019-04-04 DIAGNOSIS — I480 Paroxysmal atrial fibrillation: Secondary | ICD-10-CM | POA: Diagnosis not present

## 2019-04-29 ENCOUNTER — Telehealth: Payer: Self-pay | Admitting: Adult Health

## 2019-04-29 NOTE — Telephone Encounter (Signed)
°  Due to current COVID 19 pandemic, our office is severely reducing in office visits until further notice, in order to minimize the risk to our patients and healthcare providers.    Called [name] rehab spoke with Flavia Shipper and scheduled a virtual visit with [email or web address]  for [employer]. she verbalized understanding of the [email or web address] process and I have sent an e-mail to [email or web address] with link and instructions as well as my name and our office number. Patient understands that they will receive a call from RN to update chart. Flavia Shipper at [facility] can be contacted at [phone]   Pt understands that although there may be some limitations with this type of visit, we will take all precautions to reduce any security or privacy concerns.  Pt understands that this will be treated like an in office visit and we will file with pt's insurance, and there may be a patient responsible charge related to this service.

## 2019-05-06 DIAGNOSIS — Z9181 History of falling: Secondary | ICD-10-CM | POA: Diagnosis not present

## 2019-05-06 DIAGNOSIS — R1312 Dysphagia, oropharyngeal phase: Secondary | ICD-10-CM | POA: Diagnosis not present

## 2019-05-06 DIAGNOSIS — R2689 Other abnormalities of gait and mobility: Secondary | ICD-10-CM | POA: Diagnosis not present

## 2019-05-06 DIAGNOSIS — R2681 Unsteadiness on feet: Secondary | ICD-10-CM | POA: Diagnosis not present

## 2019-05-06 DIAGNOSIS — R278 Other lack of coordination: Secondary | ICD-10-CM | POA: Diagnosis not present

## 2019-05-06 DIAGNOSIS — M6281 Muscle weakness (generalized): Secondary | ICD-10-CM | POA: Diagnosis not present

## 2019-05-06 DIAGNOSIS — I69354 Hemiplegia and hemiparesis following cerebral infarction affecting left non-dominant side: Secondary | ICD-10-CM | POA: Diagnosis not present

## 2019-05-08 DIAGNOSIS — Z8673 Personal history of transient ischemic attack (TIA), and cerebral infarction without residual deficits: Secondary | ICD-10-CM | POA: Diagnosis not present

## 2019-05-08 DIAGNOSIS — M1990 Unspecified osteoarthritis, unspecified site: Secondary | ICD-10-CM | POA: Diagnosis not present

## 2019-05-08 DIAGNOSIS — E46 Unspecified protein-calorie malnutrition: Secondary | ICD-10-CM | POA: Diagnosis not present

## 2019-05-08 DIAGNOSIS — K649 Unspecified hemorrhoids: Secondary | ICD-10-CM | POA: Diagnosis not present

## 2019-05-08 DIAGNOSIS — I10 Essential (primary) hypertension: Secondary | ICD-10-CM | POA: Diagnosis not present

## 2019-05-08 DIAGNOSIS — R1310 Dysphagia, unspecified: Secondary | ICD-10-CM | POA: Diagnosis not present

## 2019-05-08 DIAGNOSIS — E785 Hyperlipidemia, unspecified: Secondary | ICD-10-CM | POA: Diagnosis not present

## 2019-05-08 DIAGNOSIS — D72829 Elevated white blood cell count, unspecified: Secondary | ICD-10-CM | POA: Diagnosis not present

## 2019-05-08 DIAGNOSIS — E039 Hypothyroidism, unspecified: Secondary | ICD-10-CM | POA: Diagnosis not present

## 2019-05-08 DIAGNOSIS — K219 Gastro-esophageal reflux disease without esophagitis: Secondary | ICD-10-CM | POA: Diagnosis not present

## 2019-05-08 DIAGNOSIS — I4891 Unspecified atrial fibrillation: Secondary | ICD-10-CM | POA: Diagnosis not present

## 2019-05-19 ENCOUNTER — Inpatient Hospital Stay: Payer: PPO | Admitting: Adult Health

## 2019-05-19 ENCOUNTER — Encounter: Payer: Self-pay | Admitting: Adult Health

## 2019-05-19 ENCOUNTER — Telehealth: Payer: Self-pay

## 2019-05-19 DIAGNOSIS — Z9181 History of falling: Secondary | ICD-10-CM | POA: Diagnosis not present

## 2019-05-19 DIAGNOSIS — M6281 Muscle weakness (generalized): Secondary | ICD-10-CM | POA: Diagnosis not present

## 2019-05-19 DIAGNOSIS — R2681 Unsteadiness on feet: Secondary | ICD-10-CM | POA: Diagnosis not present

## 2019-05-19 DIAGNOSIS — R2689 Other abnormalities of gait and mobility: Secondary | ICD-10-CM | POA: Diagnosis not present

## 2019-05-19 DIAGNOSIS — I69354 Hemiplegia and hemiparesis following cerebral infarction affecting left non-dominant side: Secondary | ICD-10-CM | POA: Diagnosis not present

## 2019-05-19 DIAGNOSIS — R1312 Dysphagia, oropharyngeal phase: Secondary | ICD-10-CM | POA: Diagnosis not present

## 2019-05-19 DIAGNOSIS — R278 Other lack of coordination: Secondary | ICD-10-CM | POA: Diagnosis not present

## 2019-05-19 NOTE — Progress Notes (Deleted)
[facility] [address] [place]. [place] [address] [phone]       Nancy Zamora Date of Birth:  [birth year] Medical Record Number:  [id number]   Reason for Referral:  hospital stroke follow up    CHIEF COMPLAINT:  No chief complaint on file.   HPI: Nancy Zamora being seen today for in office hospital follow-up regarding right MCA stroke due to right distal M2 occlusion secondary to newly diagnosed PAF on 03/26/2019.  History obtained from *** and chart review. Reviewed all radiology images and labs personally.  Nancy Zamora a 85 y.o.femalewith history of HTN, CKD, AV stenosis, PSVT/PVCs, hypothyroidism and peripheral neuropathy who presented on 03/26/2019 following a fall with slurred speech, left sided facial droop, rightgazepreferenceand left arm weakness.  Stroke work-up revealed right MCA infarct as evidenced on MRI due to right distal M2 occlusion as evidenced on CTA secondary to newly diagnosed PAF.  CT perfusion showed 5 mL right MCA territory infarct right frontal operculum with 22 mL penumbra.  Underwent cerebral angiogram showing occlusion right MCA M2/M3 junction. 2D echo normal EF without cardiac source of embolus identified.  Lower extremity venous Dopplers negative for DVT. Found to be in PAF with RVR on telemetry in ICU and recommended initiation of Eliquis at discharge with continuation of Cardizem.  LDL 80.  A1c 5.5.  HTN stable.  Initiated atorvastatin 20 mg daily for LDL goal <70.  No evidence or history of DM.  Other stroke risk factors include advanced age, family history of stroke and nonrheumatic AV stenosis but no prior history of stroke.  Other active problems include stable hypothyroidism, osteoporosis on denosumab, leukocytosis, GERD on PPI and restart of Celebrex with patient understanding increased risk of bleeding on DOAC.  Residual deficits of mild dysarthria, dysphagia, left facial droop, right gaze  preference, and left hemiparesis and was discharged to SNF for ongoing therapies.      ROS:   14 system review of systems performed and negative with exception of ***  PMH: No past medical history on file.  PSH:  Past Surgical History:  Procedure Laterality Date   IR ANGIO INTRA EXTRACRAN SEL COM CAROTID INNOMINATE UNI R MOD SED  03/26/2019   IR ANGIO VERTEBRAL SEL SUBCLAVIAN INNOMINATE UNI R MOD SED  03/26/2019   RADIOLOGY WITH ANESTHESIA Left 03/26/2019   Procedure: RADIOLOGY WITH ANESTHESIA;  Surgeon: Luanne Bras, MD;  Location: [facility];  Service: Radiology;  Laterality: Left;    Social History:  Social History   Socioeconomic History   Marital status: Widowed    Spouse name: Not on file   Number of children: Not on file   Years of education: Not on file   Highest education level: Not on file  Occupational History   Not on file  Social Needs   Financial resource strain: Not on file   Food insecurity    Worry: Not on file    Inability: Not on file   Transportation needs    Medical: Not on file    Non-medical: Not on file  Tobacco Use   Smoking status: Never Smoker   Smokeless tobacco: Never Used  Substance and Sexual Activity   Alcohol use: Not on file   Drug use: Not on file   Sexual activity: Not on file  Lifestyle   Physical activity    Days per week: Not on file    Minutes per session: Not on file   Stress: Not on file  Relationships   Social Herbalist on phone: Not on file    Gets together: Not on file    Attends religious service: Not on file    Active member of club or organization: Not on file    Attends meetings of clubs or organizations: Not on file    Relationship status: Not on file   Intimate partner violence    Fear of current or ex partner: Not on file    Emotionally abused: Not on file    Physically abused: Not on file    Forced sexual activity: Not on file  Other Topics Concern   Not on file  Social  History Narrative   Not on file    Family History: No family history on file.  Medications:   Current Outpatient Medications on File Prior to Visit  Medication Sig Dispense Refill   apixaban (ELIQUIS) 2.5 MG TABS tablet Take 1 tablet (2.5 mg total) by mouth 2 (two) times daily. 60 tablet    atorvastatin (LIPITOR) 20 MG tablet Take 1 tablet (20 mg total) by mouth daily at 6 PM.     Carboxymethylcell-Hypromellose 0.25-0.3 % GEL Place 1 drop into both eyes daily as needed (dry eyes).     celecoxib (CELEBREX) 200 MG capsule Take 1 capsule (200 mg total) by mouth daily.     diltiazem (CARDIZEM CD) 180 MG 24 hr capsule Take 180 mg by mouth daily.     ipratropium (ATROVENT) 0.06 % nasal spray Place 2 sprays into both nostrils 4 (four) times daily as needed for rhinitis.     levothyroxine (SYNTHROID) 112 MCG tablet Take 1 tablet (112 mcg total) by mouth daily.     Maltodextrin-Xanthan Gum (RESOURCE THICKENUP CLEAR) POWD Take 120 g by mouth as needed (dysphagia - for honey thick liquid consistency).     mometasone (NASONEX) 50 MCG/ACT nasal spray Place 2 sprays into the nose daily.     Multiple Vitamin (MULTIVITAMIN) capsule Take 1 capsule by mouth daily.     omeprazole (PRILOSEC) 40 MG capsule Take 40 mg by mouth 2 (two) times daily.     polyethylene glycol (MIRALAX / GLYCOLAX) 17 g packet Take 17 g by mouth daily as needed for constipation.     traMADol (ULTRAM) 50 MG tablet Take 50 mg by mouth 2 (two) times daily as needed for pain.     triamcinolone cream (KENALOG) 0.1 % Apply 1 application topically 2 (two) times daily.     vitamin C (ASCORBIC ACID) 500 MG tablet Take 500 mg by mouth daily.     Vitamin D, Ergocalciferol, (DRISDOL) 1.25 MG (50000 UT) CAPS capsule Take 50,000 Units by mouth once a week.     No current facility-administered medications on file prior to visit.     Allergies:   Allergies  Allergen Reactions   Chocolate     unknown   Gabapentin     unknown    Penicillins     Unknown  ( not found in DR records sent from [facility])   Tetracyclines & Related     unknown   Tape Rash    Tears skin     Physical Exam  There were no vitals filed for this visit. There is no height or weight on file to calculate BMI. No exam data present  No flowsheet data found.   General: well developed, well nourished, seated, in no evident distress Head: head normocephalic and atraumatic.   Neck: supple with no carotid  or supraclavicular bruits Cardiovascular: regular rate and rhythm, no murmurs Musculoskeletal: no deformity Skin:  no rash/petichiae Vascular:  Normal pulses all extremities   Neurologic Exam Mental Status: Awake and fully alert. Oriented to place and time. Recent and remote memory intact. Attention span, concentration and fund of knowledge appropriate. Mood and affect appropriate.  Cranial Nerves: Fundoscopic exam reveals sharp disc margins. Pupils equal, briskly reactive to light. Extraocular movements full without nystagmus. Visual fields full to confrontation. Hearing intact. Facial sensation intact. Face, tongue, palate moves normally and symmetrically.  Motor: Normal bulk and tone. Normal strength in all tested extremity muscles. Sensory.: intact to touch , pinprick , position and vibratory sensation.  Coordination: Rapid alternating movements normal in all extremities. Finger-to-nose and heel-to-shin performed accurately bilaterally. Gait and Station: Arises from chair without difficulty. Stance is normal. Gait demonstrates normal stride length and balance Reflexes: 1+ and symmetric. Toes downgoing.     NIHSS  *** Modified Rankin  *** [id number]-VASc 7 HAS-BLED 2   Diagnostic Data (Labs, Imaging, Testing)  CT HEAD WO CONTRAST 03/26/2019 IMPRESSION: 1. No intracranial hemorrhage. 2. ASPECTS is 8  CT ANGIO HEAD W OR WO CONTRAST CT ANGIO NECK W OR WO CONTRAST CT CEREBRAL PERFUSION W CONTRAST 03/26/2019 IMPRESSION: 1.  Occlusion of a mid-distal right M2 branch, corresponding to the area of ischemia on the perfusion scan. These results were called by telephone at the time of interpretation on 03/26/2019 at 7:56 pm to Dr.ERIC [facility] , who verbally acknowledged these results. 2. 5 mL right MCA territory infarction, predominantly in the right frontal operculum, with 22 mL area of ischemic penumbra. 3. No hemodynamically significant stenosis of the carotid or vertebral arteries by the NASCET criteria.  MR BRAIN WO CONTRAST 03/27/2019 IMPRESSION: Multifocal ischemic infarction of the right MCA territory without acute hemorrhage or mass effect.  CEREBRAL ANGIO 03/26/2019 IMPRESSION: Angiographic occlusion of an anterior perisylvian branch of the superior division of the right middle cerebral artery in the M2 M3 region. Given the size of this vessel distally endovascular revascularization is felt to be too risky given the availability of the present stent retrievers.  ECHOCARDIOGRAM 03/27/2019 IMPRESSIONS  1. The left ventricle has hyperdynamic systolic function, with an ejection fraction of >65%. The cavity size was normal. Left ventricular diastolic Doppler parameters are consistent with impaired relaxation. Elevated left atrial and left ventricular  end-diastolic pressures.  2. The right ventricle has normal systolic function. The cavity was normal. There is no increase in right ventricular wall thickness. Right ventricular systolic pressure is normal with an estimated pressure of 27.8 mmHg.  3. Tricuspid valve regurgitation is moderate.  4. The aortic valve is tricuspid. Severely thickening of the aortic valve. Severe calcifcation of the aortic valve. Moderate stenosis of the aortic valve with peak/mean gradients 36/17 mmHg.  5. The aorta is normal unless otherwise noted.       ASSESSMENT: Nancy Zamora is a 85 y.o. year old female presented after a fall with slurred speech, left facial droop, right  gaze preference and left arm weakness on 03/26/2019 with stroke work-up revealing right MCA infarct due to right distal M2 occlusion secondary to newly diagnosed PAF. Vascular risk factors include new dx PAF, HTN, HLD, CKD, AV stenosis, PSVT/PVCs and hypothyroidism.     PLAN:  1. Right MCA stroke: Continue Eliquis (apixaban) daily  and atorvastatin for secondary stroke prevention. Maintain strict control of hypertension with blood pressure goal below 130/90, diabetes with hemoglobin A1c goal below 6.5% and cholesterol with LDL  cholesterol (bad cholesterol) goal below 70 mg/dL.  I also advised the patient to eat a healthy diet with plenty of whole grains, cereals, fruits and vegetables, exercise regularly with at least 30 minutes of continuous activity daily and maintain ideal body weight. 2. HTN: Advised to continue current treatment regimen.  Today's BP ***.  Advised to continue to monitor at home along with continued follow-up with PCP for management 3. HLD: Advised to continue current treatment regimen along with continued follow-up with PCP for future prescribing and monitoring of lipid panel 4. Atrial fibrillation: Continuation of Eliquis and ongoing follow-up/monitoring with cardiology    Follow up in *** or call earlier if needed   Greater than 50% of time during this 45 minute visit was spent on counseling, explanation of diagnosis of right MCA stroke, reviewing risk factor management of HTN, HLD and atrial fibrillation, planning of further management along with potential future management, and discussion with patient and family answering all questions.    Frann Rider, AGNP-BC  [facility] [address] [place], [place] [address]  Phone [phone] Fax [phone] Note: This document was prepared with digital dictation and possible smart phrase technology. Any transcriptional errors that result from this process are unintentional.

## 2019-05-19 NOTE — Telephone Encounter (Signed)
Patient was a no call/no show for their appointment today.   

## 2019-05-23 DIAGNOSIS — N390 Urinary tract infection, site not specified: Secondary | ICD-10-CM | POA: Diagnosis not present

## 2019-05-23 DIAGNOSIS — D649 Anemia, unspecified: Secondary | ICD-10-CM | POA: Diagnosis not present

## 2019-05-27 DIAGNOSIS — N390 Urinary tract infection, site not specified: Secondary | ICD-10-CM | POA: Diagnosis not present

## 2019-06-09 DIAGNOSIS — I10 Essential (primary) hypertension: Secondary | ICD-10-CM | POA: Diagnosis not present

## 2019-06-09 DIAGNOSIS — I4819 Other persistent atrial fibrillation: Secondary | ICD-10-CM | POA: Diagnosis not present

## 2019-06-09 DIAGNOSIS — I679 Cerebrovascular disease, unspecified: Secondary | ICD-10-CM | POA: Diagnosis not present

## 2019-06-09 DIAGNOSIS — N3000 Acute cystitis without hematuria: Secondary | ICD-10-CM | POA: Diagnosis not present

## 2019-06-17 DIAGNOSIS — I69354 Hemiplegia and hemiparesis following cerebral infarction affecting left non-dominant side: Secondary | ICD-10-CM | POA: Diagnosis not present

## 2019-06-17 DIAGNOSIS — Z9181 History of falling: Secondary | ICD-10-CM | POA: Diagnosis not present

## 2019-06-17 DIAGNOSIS — R278 Other lack of coordination: Secondary | ICD-10-CM | POA: Diagnosis not present

## 2019-06-17 DIAGNOSIS — M6281 Muscle weakness (generalized): Secondary | ICD-10-CM | POA: Diagnosis not present

## 2019-06-17 DIAGNOSIS — R2681 Unsteadiness on feet: Secondary | ICD-10-CM | POA: Diagnosis not present

## 2019-06-17 DIAGNOSIS — R2689 Other abnormalities of gait and mobility: Secondary | ICD-10-CM | POA: Diagnosis not present

## 2019-06-18 DIAGNOSIS — J310 Chronic rhinitis: Secondary | ICD-10-CM | POA: Diagnosis not present

## 2019-06-18 DIAGNOSIS — N390 Urinary tract infection, site not specified: Secondary | ICD-10-CM | POA: Diagnosis not present

## 2019-06-18 DIAGNOSIS — K219 Gastro-esophageal reflux disease without esophagitis: Secondary | ICD-10-CM | POA: Diagnosis not present

## 2019-06-18 DIAGNOSIS — E785 Hyperlipidemia, unspecified: Secondary | ICD-10-CM | POA: Diagnosis not present

## 2019-06-18 DIAGNOSIS — M1990 Unspecified osteoarthritis, unspecified site: Secondary | ICD-10-CM | POA: Diagnosis not present

## 2019-06-18 DIAGNOSIS — E039 Hypothyroidism, unspecified: Secondary | ICD-10-CM | POA: Diagnosis not present

## 2019-06-18 DIAGNOSIS — I10 Essential (primary) hypertension: Secondary | ICD-10-CM | POA: Diagnosis not present

## 2019-06-18 DIAGNOSIS — K649 Unspecified hemorrhoids: Secondary | ICD-10-CM | POA: Diagnosis not present

## 2019-06-18 DIAGNOSIS — Z8673 Personal history of transient ischemic attack (TIA), and cerebral infarction without residual deficits: Secondary | ICD-10-CM | POA: Diagnosis not present

## 2019-06-18 DIAGNOSIS — I4891 Unspecified atrial fibrillation: Secondary | ICD-10-CM | POA: Diagnosis not present

## 2019-06-19 DIAGNOSIS — R946 Abnormal results of thyroid function studies: Secondary | ICD-10-CM | POA: Diagnosis not present

## 2019-06-25 DIAGNOSIS — D649 Anemia, unspecified: Secondary | ICD-10-CM | POA: Diagnosis not present

## 2019-07-04 DIAGNOSIS — N390 Urinary tract infection, site not specified: Secondary | ICD-10-CM | POA: Diagnosis not present

## 2019-07-04 DIAGNOSIS — R946 Abnormal results of thyroid function studies: Secondary | ICD-10-CM | POA: Diagnosis not present

## 2019-07-04 DIAGNOSIS — D649 Anemia, unspecified: Secondary | ICD-10-CM | POA: Diagnosis not present

## 2019-07-04 DIAGNOSIS — I10 Essential (primary) hypertension: Secondary | ICD-10-CM | POA: Diagnosis not present

## 2019-07-04 DIAGNOSIS — Z5181 Encounter for therapeutic drug level monitoring: Secondary | ICD-10-CM | POA: Diagnosis not present

## 2019-07-09 DIAGNOSIS — R350 Frequency of micturition: Secondary | ICD-10-CM | POA: Diagnosis not present

## 2019-07-09 DIAGNOSIS — H35372 Puckering of macula, left eye: Secondary | ICD-10-CM | POA: Diagnosis not present

## 2019-07-09 DIAGNOSIS — R252 Cramp and spasm: Secondary | ICD-10-CM | POA: Diagnosis not present

## 2019-07-09 DIAGNOSIS — Z8673 Personal history of transient ischemic attack (TIA), and cerebral infarction without residual deficits: Secondary | ICD-10-CM | POA: Diagnosis not present

## 2019-07-09 DIAGNOSIS — I129 Hypertensive chronic kidney disease with stage 1 through stage 4 chronic kidney disease, or unspecified chronic kidney disease: Secondary | ICD-10-CM | POA: Diagnosis not present

## 2019-07-09 DIAGNOSIS — H353131 Nonexudative age-related macular degeneration, bilateral, early dry stage: Secondary | ICD-10-CM | POA: Diagnosis not present

## 2019-07-09 DIAGNOSIS — I482 Chronic atrial fibrillation, unspecified: Secondary | ICD-10-CM | POA: Diagnosis not present

## 2019-07-09 DIAGNOSIS — N183 Chronic kidney disease, stage 3 unspecified: Secondary | ICD-10-CM | POA: Diagnosis not present

## 2019-07-09 DIAGNOSIS — H04123 Dry eye syndrome of bilateral lacrimal glands: Secondary | ICD-10-CM | POA: Diagnosis not present

## 2019-07-09 DIAGNOSIS — H35362 Drusen (degenerative) of macula, left eye: Secondary | ICD-10-CM | POA: Diagnosis not present

## 2019-07-09 DIAGNOSIS — E039 Hypothyroidism, unspecified: Secondary | ICD-10-CM | POA: Diagnosis not present

## 2019-07-09 DIAGNOSIS — H3554 Dystrophies primarily involving the retinal pigment epithelium: Secondary | ICD-10-CM | POA: Diagnosis not present

## 2019-07-14 NOTE — Progress Notes (Signed)
[facility] [address] [place]. [place] [address] [phone]       Nancy Zamora Date of Birth:  09/18/1930 Medical Record Number:  [id number]   Reason for Referral:  hospital stroke follow up    CHIEF COMPLAINT:  Chief Complaint  Patient presents with  . Follow-up    RM9. with daughter. Doing well. Did rehab at [facility]. Supposed to start physical therapy at home today.    HPI: Nancy Zamora being seen today for in office hospital follow-up regarding right MCA infarct due to right distal M2 occlusion embolic secondary to PAF on 03/26/2019.  History obtained from patient, daughter and chart review. Reviewed all radiology images and labs personally.  Nancy Zamora a 85 y.o.femalewith history of HTN, CKD, AV stenosis, PSVT/PVCs, hypothyroidism and peripheral neuropathy presented on 03/26/2019 following a fall with slurred speech, left sided facial droop, rightgazepreferenceand left arm weakness.  Stroke work-up showed right MCA infarct as evidenced on MRI due to right distal M2 occlusion as evidenced on CTA embolic pattern secondary to PAF.  Underwent cerebral angiogram which showed occlusion right MCA M2/M3 junction.  2D echo showed an EF greater than 65% without cardiac source of embolus identified.  LE venous Doppler negative for DVT.  History of SVT with PAF and RVR found on telemetry and recommended initiating Eliquis 2.5 mg twice daily.  HTN stable with transient hypotension with sleep and recommended long-term BP goal normotensive range.  LDL 88 and initiated atorvastatin 20 mg daily.  Other stroke risk factors include advanced age, family history of stroke and nonrheumatic AV stenosis but no prior history of stroke.  Other active problems trop 58, hypothyroidism, osteoporosis, leukocytosis, chest x-ray showed small effusion/atelectasis, left humeral head chronically dislocated, left elbow joint effusion w/p acute bony  abnormality, GERD on PPI, constipation and use of Celebrex with patient understanding increased risk of bleeding on DOAC.  Residual deficits include dysphagia, mild dysarthria, left facial droop, incomplete left gaze, and left hemiparesis in therapies recommended discharge to SNF for ongoing therapy.  Nancy Zamora is a 85 year old female who is being seen today for hospital follow-up accompanied by her daughter.  Residual deficits of left hemiparesis, left facial droop and mild dysarthria.  She was discharged from SNF on 07/06/2019 return to her own home where she is currently receiving 24-hour care as well as home health PT/OT.  She was independent prior to her stroke but currently receiving 24-hour care due to safety concerns.  She is able to ambulate with Rollator walker and denies any recent falls.  History of chronic dislocated left shoulder causing increased pain and interfering with recovery.  Daughter is questioning potential benefit of Botox injections as this was discussed with the family member.  Patient and daughter are questioning repeating imaging due to concern of prior occlusion seen on imaging during hospitalization.  She has continued on Eliquis 2.5 mg twice daily without bleeding or bruising.  She does have established cardiologist but has not had follow-up at this time.  Continues on atorvastatin 20 mg daily without myalgias.  Blood pressure today satisfactory at 125/80.  No further concerns at this time.    ROS:   14 system review of systems performed and negative with exception of swelling in legs, loss of vision, snoring, incontinence, feeling cold, joint pain, joint swelling, runny nose, short-term memory loss, numbness, and not enough sleep  PMH: No past medical history on file.  PSH:  Past Surgical History:  Procedure  Laterality Date  . IR ANGIO INTRA EXTRACRAN SEL COM CAROTID INNOMINATE UNI R MOD SED  03/26/2019  . IR ANGIO VERTEBRAL SEL SUBCLAVIAN INNOMINATE UNI R MOD SED   03/26/2019  . RADIOLOGY WITH ANESTHESIA Left 03/26/2019   Procedure: RADIOLOGY WITH ANESTHESIA;  Surgeon: Luanne Bras, MD;  Location: [place];  Service: Radiology;  Laterality: Left;    Social History:  Social History   Socioeconomic History  . Marital status: Widowed    Spouse name: Not on file  . Number of children: Not on file  . Years of education: Not on file  . Highest education level: Not on file  Occupational History  . Not on file  Tobacco Use  . Smoking status: Never Smoker  . Smokeless tobacco: Never Used  Substance and Sexual Activity  . Alcohol use: Not on file  . Drug use: Not on file  . Sexual activity: Not on file  Other Topics Concern  . Not on file  Social History Narrative  . Not on file   Social Determinants of Health   Financial Resource Strain:   . Difficulty of Paying Living Expenses: Not on file  Food Insecurity:   . Worried About Charity fundraiser in the Last Year: Not on file  . Ran Out of Food in the Last Year: Not on file  Transportation Needs:   . Lack of Transportation (Medical): Not on file  . Lack of Transportation (Non-Medical): Not on file  Physical Activity:   . Days of Exercise per Week: Not on file  . Minutes of Exercise per Session: Not on file  Stress:   . Feeling of Stress : Not on file  Social Connections:   . Frequency of Communication with Friends and Family: Not on file  . Frequency of Social Gatherings with Friends and Family: Not on file  . Attends Religious Services: Not on file  . Active Member of Clubs or Organizations: Not on file  . Attends Archivist Meetings: Not on file  . Marital Status: Not on file  Intimate Partner Violence:   . Fear of Current or Ex-Partner: Not on file  . Emotionally Abused: Not on file  . Physically Abused: Not on file  . Sexually Abused: Not on file    Family History: No family history on file.  Medications:   Current Outpatient Medications on File Prior to Visit    Medication Sig Dispense Refill  . acyclovir (ZOVIRAX) 800 MG tablet Take 800 mg by mouth 3 (three) times daily as needed.    Marland Kitchen apixaban (ELIQUIS) 2.5 MG TABS tablet Take 1 tablet (2.5 mg total) by mouth 2 (two) times daily. 60 tablet   . atorvastatin (LIPITOR) 20 MG tablet Take 1 tablet (20 mg total) by mouth daily at 6 PM.    . calcium-vitamin D (OSCAL WITH D) 500-200 MG-UNIT TABS tablet Take by mouth.    . Carboxymethylcell-Hypromellose 0.25-0.3 % GEL Place 1 drop into both eyes daily as needed (dry eyes).    . celecoxib (CELEBREX) 200 MG capsule Take 1 capsule (200 mg total) by mouth daily.    Marland Kitchen diltiazem (CARDIZEM CD) 180 MG 24 hr capsule Take 180 mg by mouth daily.    . hydrocortisone (ANUSOL-HC) 25 MG suppository Place 25 mg rectally 2 (two) times daily. PRN    . ipratropium (ATROVENT) 0.06 % nasal spray Place 2 sprays into both nostrils 4 (four) times daily as needed for rhinitis.    Marland Kitchen levothyroxine (SYNTHROID)  112 MCG tablet Take 1 tablet (112 mcg total) by mouth daily.    . mometasone (NASONEX) 50 MCG/ACT nasal spray Place 2 sprays into the nose daily.    . Multiple Vitamin (MULTIVITAMIN) capsule Take 1 capsule by mouth daily.    Marland Kitchen omeprazole (PRILOSEC) 40 MG capsule Take 40 mg by mouth 2 (two) times daily.    . phenazopyridine (PYRIDIUM) 95 MG tablet Take 95 mg by mouth 3 (three) times daily as needed for pain. PRN    . polyethylene glycol (MIRALAX / GLYCOLAX) 17 g packet Take 17 g by mouth daily as needed for constipation.    . traMADol (ULTRAM) 50 MG tablet Take 50 mg by mouth 2 (two) times daily as needed for pain.    Marland Kitchen triamcinolone cream (KENALOG) 0.1 % Apply 1 application topically 2 (two) times daily.    . vitamin C (ASCORBIC ACID) 500 MG tablet Take 500 mg by mouth daily.    . Vitamin D, Ergocalciferol, (DRISDOL) 1.25 MG (50000 UT) CAPS capsule Take 50,000 Units by mouth once a week.    . Maltodextrin-Xanthan Gum (RESOURCE THICKENUP CLEAR) POWD Take 120 g by mouth as needed  (dysphagia - for honey thick liquid consistency). (Patient not taking: Reported on 07/15/2019)     No current facility-administered medications on file prior to visit.    Allergies:   Allergies  Allergen Reactions  . Chocolate     unknown  . Gabapentin     unknown  . Penicillins     Unknown  ( not found in DR records sent from [facility])  . Tetracyclines & Related     unknown  . Tape Rash    Tears skin     Physical Exam  Vitals:   07/15/19 0810  BP: 125/80  Pulse: 81  Temp: 97.6 F (36.4 C)  Weight: 106 lb (48.1 kg)  Height: 5\' 2"  (1.575 m)   Body mass index is 19.39 kg/m. No exam data present  Depression screen [facility] 2/9 07/15/2019  Decreased Interest 0  Down, Depressed, Hopeless 0  PHQ - 2 Score 0     General: Frail pleasant elderly Caucasian female, seated, in no evident distress Head: head normocephalic and atraumatic.   Neck: supple with no carotid or supraclavicular bruits Cardiovascular: irregular rate and rhythm, no murmurs Musculoskeletal: no deformity Skin:  no rash/petichiae Vascular:  Normal pulses all extremities   Neurologic Exam Mental Status: Awake and fully alert.   Mild dysarthria.  Oriented to place and time. Recent and remote memory intact. Attention span, concentration and fund of knowledge appropriate. Mood and affect appropriate.  Cranial Nerves: Fundoscopic exam reveals sharp disc margins. Pupils equal, briskly reactive to light. Extraocular movements full without nystagmus. Visual fields full to confrontation. Hearing intact. Facial sensation intact.  Mild left lower facial weakness Motor:  LUE: 3/5 but difficulty fully assessing due to left shoulder pain.  Decreased ROM/spasticity in left shoulder, left elbow and left hand LLE: 4/5 Adequate strength right upper and lower extremity Sensory.: intact to touch , pinprick , position and vibratory sensation.  Coordination: Rapid alternating movements normal in all extremities except mild  decreased left finger tapping. Finger-to-nose and heel-to-shin unable to evaluate due to weakness and pain. Gait and Station: Arises from chair without difficulty. Stance is slightly hunched. Gait demonstrates normal stride length and balance with use of Rollator walker Reflexes: 1+ and symmetric. Toes downgoing.     NIHSS  3 Modified Rankin  3 CHA2DS2-VASc 7 HAS-BLED 2  Diagnostic Data (Labs, Imaging, Testing)  CT HEAD WO CONTRAST 03/26/2019 IMPRESSION: 1. No intracranial hemorrhage. 2. ASPECTS is 8  CT ANGIO HEAD W OR WO CONTRAST CT ANGIO NECK W OR WO CONTRAST CT CEREBRAL PERFUSION W CONTRAST 03/26/2019 IMPRESSION: 1. Occlusion of a mid-distal right M2 branch, corresponding to the area of ischemia on the perfusion scan. These results were called by telephone at the time of interpretation on 03/26/2019 at 7:56 pm to Dr.ERIC [facility] , who verbally acknowledged these results. 2. 5 mL right MCA territory infarction, predominantly in the right frontal operculum, with 22 mL area of ischemic penumbra. 3. No hemodynamically significant stenosis of the carotid or vertebral arteries by the NASCET criteria. Aortic Atherosclerosis (ICD10-I70.0).  MR BRAIN WO CONTRAST 03/27/2019 IMPRESSION: Multifocal ischemic infarction of the right MCA territory without acute hemorrhage or mass effect.  Cerebral Angiogram 03/27/2019 Occlusion of RT MCA superior division ant peri sylvian branch M2/M3 junction.  2D Echocardiogram 1. The left ventricle has hyperdynamic systolic function, with an ejection fraction of >65%. The cavity size was normal. Left ventricular diastolic Doppler parameters are consistent with impaired relaxation. Elevated left atrial and left ventricular end-diastolic pressures. 2. The right ventricle has normal systolic function. The cavity was normal. There is no increase in right ventricular wall thickness. Right ventricular systolic pressure is normal with an estimated  pressure of 27.8 mmHg. 3. Tricuspid valve regurgitation is moderate. 4. The aortic valve is tricuspid. Severely thickening of the aortic valve. Severe calcifcation of the aortic valve. Moderate stenosis of the aortic valve with peak/mean gradients 36/17 mmHg. 5. The aorta is normal unless otherwise noted.   ASSESSMENT: Nancy Zamora is a 85 y.o. year old female presented after a fall with slurred speech, left-sided facial droop, right gaze preference and left arm weakness on 03/26/2019 with stroke work-up showing right MCA infarct due to right distal M2 occlusion embolic secondary to new onset PAF.  She was started on Eliquis 2.5 twice daily.  Vascular risk factors include new onset PAF with RVR, history of SVT, HTN, HLD, advanced age, family history of stroke and nonrheumatic AV stenosis.  Residual deficits of left hemiparesis, left facial droop and dysarthria    PLAN:  1. Right MCA stroke: Continue Eliquis (apixaban) daily  and atorvastatin for secondary stroke prevention. Maintain strict control of hypertension with blood pressure goal below 130/90, diabetes with hemoglobin A1c goal below 6.5% and cholesterol with LDL cholesterol (bad cholesterol) goal below 70 mg/dL.  I also advised the patient to eat a healthy diet with plenty of whole grains, cereals, fruits and vegetables, exercise regularly with at least 30 minutes of continuous activity daily and maintain ideal body weight. Will repeat CTA head to ensure resoluation of prior occlusion due to PAF 2. New dx PAF: Continue Eliquis 2.5 mg twice daily and advised to schedule follow-up with established cardiologist 3. HTN: Advised to continue current treatment regimen.  Today's BP stable.  Advised to continue to monitor at home along with continued follow-up with PCP for management 4. HLD: Advised to continue current treatment regimen along with continued follow-up with PCP for future prescribing and monitoring of lipid panel 5. Residual  deficits: continue to work with home PT/OT and recommend starting ST for ongoing speech concerns. Possibly transition to outpatient therapy once completed    Follow up in 3 months or call earlier if needed   Greater than 50% of time during this 45 minute visit was spent on counseling, explanation of diagnosis of right MCA stroke,  reviewing risk factor management of PAF, HTN, and HLD, planning of further management along with potential future management, and discussion with patient and family answering all questions.    Frann Rider, AGNP-BC  [facility] [address] [place], [place] [address]  Phone [phone] Fax [phone] Note: This document was prepared with digital dictation and possible smart phrase technology. Any transcriptional errors that result from this process are unintentional.

## 2019-07-15 ENCOUNTER — Encounter: Payer: Self-pay | Admitting: Adult Health

## 2019-07-15 ENCOUNTER — Other Ambulatory Visit: Payer: Self-pay

## 2019-07-15 ENCOUNTER — Ambulatory Visit (INDEPENDENT_AMBULATORY_CARE_PROVIDER_SITE_OTHER): Payer: PPO | Admitting: Adult Health

## 2019-07-15 VITALS — BP 125/80 | HR 81 | Temp 97.6°F | Ht 62.0 in | Wt 106.0 lb

## 2019-07-15 DIAGNOSIS — I639 Cerebral infarction, unspecified: Secondary | ICD-10-CM

## 2019-07-15 DIAGNOSIS — I480 Paroxysmal atrial fibrillation: Secondary | ICD-10-CM | POA: Diagnosis not present

## 2019-07-15 DIAGNOSIS — I10 Essential (primary) hypertension: Secondary | ICD-10-CM | POA: Diagnosis not present

## 2019-07-15 DIAGNOSIS — E782 Mixed hyperlipidemia: Secondary | ICD-10-CM | POA: Diagnosis not present

## 2019-07-15 DIAGNOSIS — I6601 Occlusion and stenosis of right middle cerebral artery: Secondary | ICD-10-CM | POA: Diagnosis not present

## 2019-07-15 NOTE — Patient Instructions (Signed)
Continue to work with physical and occupational home therapies and highly recommend adding speech therapy -we will reach out to advanced home care to assist with this  We will repeat imaging of the vessels in your head to assess for resolution of prior clots  Referral will be placed to physical medicine and rehab for possible benefit of Botox injections -you will be called to schedule a visit  Continue Eliquis (apixaban) daily  and Lipitor for secondary stroke prevention  Schedule follow-up appointment with your cardiologist for ongoing monitoring and management of atrial fibrillation on Eliquis  Continue to follow up with PCP regarding cholesterol and blood pressure management   Continue to monitor blood pressure at home  Maintain strict control of hypertension with blood pressure goal below 130/90, diabetes with hemoglobin A1c goal below 6.5% and cholesterol with LDL cholesterol (bad cholesterol) goal below 70 mg/dL. I also advised the patient to eat a healthy diet with plenty of whole grains, cereals, fruits and vegetables, exercise regularly and maintain ideal body weight.  Followup in the future with me in 3 months or call earlier if needed       Thank you for coming to see [place] at [facility]. I hope we have been able to provide you high quality care today.  You may receive a patient satisfaction survey over the next few weeks. We would appreciate your feedback and comments so that we may continue to improve ourselves and the health of our patients.

## 2019-07-22 NOTE — Progress Notes (Signed)
I agree with the above plan 

## 2019-07-24 ENCOUNTER — Other Ambulatory Visit: Payer: Self-pay

## 2019-07-24 ENCOUNTER — Ambulatory Visit
Admission: RE | Admit: 2019-07-24 | Discharge: 2019-07-24 | Disposition: A | Discharge status: Home / Self Care | Payer: PPO | Source: Ambulatory Visit | Attending: Adult Health | Admitting: Adult Health

## 2019-07-24 DIAGNOSIS — I63511 Cerebral infarction due to unspecified occlusion or stenosis of right middle cerebral artery: Secondary | ICD-10-CM | POA: Diagnosis not present

## 2019-07-24 MED ORDER — IOPAMIDOL (ISOVUE-370) INJECTION 76%
75.0000 mL | Freq: Once | INTRAVENOUS | Status: AC | PRN
  Administered 2019-07-24: 75 mL via INTRAVENOUS

## 2019-07-29 ENCOUNTER — Telehealth: Payer: Self-pay | Admitting: *Deleted

## 2019-07-29 NOTE — Telephone Encounter (Signed)
Called daughter, Ivin Booty on [place] and informed her that the recent imaging of CT angio head is unchanged compared to prior imaging. Janett Billow advised to please continue current regimen of medications to control HTN, lipids, and prevent secondary stroke. Daughter verbalized understanding, appreciation.

## 2019-08-05 DIAGNOSIS — I10 Essential (primary) hypertension: Secondary | ICD-10-CM | POA: Diagnosis not present

## 2019-08-05 DIAGNOSIS — I129 Hypertensive chronic kidney disease with stage 1 through stage 4 chronic kidney disease, or unspecified chronic kidney disease: Secondary | ICD-10-CM | POA: Diagnosis not present

## 2019-08-05 DIAGNOSIS — R600 Localized edema: Secondary | ICD-10-CM | POA: Diagnosis not present

## 2019-08-05 DIAGNOSIS — N183 Chronic kidney disease, stage 3 unspecified: Secondary | ICD-10-CM | POA: Diagnosis not present

## 2019-08-05 DIAGNOSIS — R350 Frequency of micturition: Secondary | ICD-10-CM | POA: Diagnosis not present

## 2019-08-05 DIAGNOSIS — I482 Chronic atrial fibrillation, unspecified: Secondary | ICD-10-CM | POA: Diagnosis not present

## 2019-08-12 DIAGNOSIS — I129 Hypertensive chronic kidney disease with stage 1 through stage 4 chronic kidney disease, or unspecified chronic kidney disease: Secondary | ICD-10-CM | POA: Diagnosis not present

## 2019-08-12 DIAGNOSIS — N183 Chronic kidney disease, stage 3 unspecified: Secondary | ICD-10-CM | POA: Diagnosis not present

## 2019-08-12 DIAGNOSIS — M8949 Other hypertrophic osteoarthropathy, multiple sites: Secondary | ICD-10-CM | POA: Diagnosis not present

## 2019-08-12 DIAGNOSIS — R600 Localized edema: Secondary | ICD-10-CM | POA: Diagnosis not present

## 2019-08-12 DIAGNOSIS — J209 Acute bronchitis, unspecified: Secondary | ICD-10-CM | POA: Diagnosis not present

## 2019-08-12 DIAGNOSIS — I482 Chronic atrial fibrillation, unspecified: Secondary | ICD-10-CM | POA: Diagnosis not present

## 2019-08-26 DIAGNOSIS — I350 Nonrheumatic aortic (valve) stenosis: Secondary | ICD-10-CM | POA: Diagnosis not present

## 2019-08-26 DIAGNOSIS — I4819 Other persistent atrial fibrillation: Secondary | ICD-10-CM | POA: Diagnosis not present

## 2019-08-26 DIAGNOSIS — R05 Cough: Secondary | ICD-10-CM | POA: Diagnosis not present

## 2019-08-26 DIAGNOSIS — E7849 Other hyperlipidemia: Secondary | ICD-10-CM | POA: Diagnosis not present

## 2019-08-26 DIAGNOSIS — I10 Essential (primary) hypertension: Secondary | ICD-10-CM | POA: Diagnosis not present

## 2019-08-26 DIAGNOSIS — I129 Hypertensive chronic kidney disease with stage 1 through stage 4 chronic kidney disease, or unspecified chronic kidney disease: Secondary | ICD-10-CM | POA: Diagnosis not present

## 2019-08-26 DIAGNOSIS — Z7901 Long term (current) use of anticoagulants: Secondary | ICD-10-CM | POA: Diagnosis not present

## 2019-08-26 DIAGNOSIS — N183 Chronic kidney disease, stage 3 unspecified: Secondary | ICD-10-CM | POA: Diagnosis not present

## 2019-08-26 DIAGNOSIS — I471 Supraventricular tachycardia: Secondary | ICD-10-CM | POA: Diagnosis not present

## 2019-08-26 DIAGNOSIS — R600 Localized edema: Secondary | ICD-10-CM | POA: Diagnosis not present

## 2019-08-29 DIAGNOSIS — I4819 Other persistent atrial fibrillation: Secondary | ICD-10-CM | POA: Diagnosis not present

## 2019-08-29 DIAGNOSIS — I4891 Unspecified atrial fibrillation: Secondary | ICD-10-CM | POA: Diagnosis not present

## 2019-09-02 DIAGNOSIS — I639 Cerebral infarction, unspecified: Secondary | ICD-10-CM | POA: Diagnosis not present

## 2019-09-02 DIAGNOSIS — I482 Chronic atrial fibrillation, unspecified: Secondary | ICD-10-CM | POA: Diagnosis not present

## 2019-09-02 DIAGNOSIS — I119 Hypertensive heart disease without heart failure: Secondary | ICD-10-CM | POA: Diagnosis not present

## 2019-09-02 DIAGNOSIS — Z7901 Long term (current) use of anticoagulants: Secondary | ICD-10-CM | POA: Diagnosis not present

## 2019-09-02 DIAGNOSIS — I480 Paroxysmal atrial fibrillation: Secondary | ICD-10-CM | POA: Diagnosis not present

## 2019-09-02 DIAGNOSIS — I350 Nonrheumatic aortic (valve) stenosis: Secondary | ICD-10-CM | POA: Diagnosis not present

## 2019-09-02 DIAGNOSIS — R350 Frequency of micturition: Secondary | ICD-10-CM | POA: Diagnosis not present

## 2019-09-02 DIAGNOSIS — I5189 Other ill-defined heart diseases: Secondary | ICD-10-CM | POA: Diagnosis not present

## 2019-09-02 DIAGNOSIS — I471 Supraventricular tachycardia: Secondary | ICD-10-CM | POA: Diagnosis not present

## 2019-09-02 DIAGNOSIS — E7849 Other hyperlipidemia: Secondary | ICD-10-CM | POA: Diagnosis not present

## 2019-09-09 DIAGNOSIS — N3000 Acute cystitis without hematuria: Secondary | ICD-10-CM | POA: Diagnosis not present

## 2019-09-09 DIAGNOSIS — I129 Hypertensive chronic kidney disease with stage 1 through stage 4 chronic kidney disease, or unspecified chronic kidney disease: Secondary | ICD-10-CM | POA: Diagnosis not present

## 2019-09-09 DIAGNOSIS — M1712 Unilateral primary osteoarthritis, left knee: Secondary | ICD-10-CM | POA: Diagnosis not present

## 2019-09-09 DIAGNOSIS — N183 Chronic kidney disease, stage 3 unspecified: Secondary | ICD-10-CM | POA: Diagnosis not present

## 2019-09-09 DIAGNOSIS — R252 Cramp and spasm: Secondary | ICD-10-CM | POA: Diagnosis not present

## 2019-09-09 DIAGNOSIS — R600 Localized edema: Secondary | ICD-10-CM | POA: Diagnosis not present

## 2019-09-17 DIAGNOSIS — I10 Essential (primary) hypertension: Secondary | ICD-10-CM | POA: Diagnosis not present

## 2019-10-06 DIAGNOSIS — I10 Essential (primary) hypertension: Secondary | ICD-10-CM | POA: Diagnosis not present

## 2019-10-06 DIAGNOSIS — I480 Paroxysmal atrial fibrillation: Secondary | ICD-10-CM | POA: Diagnosis not present

## 2019-10-06 DIAGNOSIS — I350 Nonrheumatic aortic (valve) stenosis: Secondary | ICD-10-CM | POA: Diagnosis not present

## 2019-10-14 ENCOUNTER — Ambulatory Visit: Payer: PPO | Admitting: Adult Health

## 2019-10-14 DIAGNOSIS — N390 Urinary tract infection, site not specified: Secondary | ICD-10-CM | POA: Diagnosis not present

## 2019-10-15 DIAGNOSIS — N183 Chronic kidney disease, stage 3 unspecified: Secondary | ICD-10-CM | POA: Diagnosis not present

## 2019-10-15 DIAGNOSIS — E039 Hypothyroidism, unspecified: Secondary | ICD-10-CM | POA: Diagnosis not present

## 2019-10-15 DIAGNOSIS — I129 Hypertensive chronic kidney disease with stage 1 through stage 4 chronic kidney disease, or unspecified chronic kidney disease: Secondary | ICD-10-CM | POA: Diagnosis not present

## 2019-10-15 DIAGNOSIS — M8949 Other hypertrophic osteoarthropathy, multiple sites: Secondary | ICD-10-CM | POA: Diagnosis not present

## 2019-10-20 DIAGNOSIS — N390 Urinary tract infection, site not specified: Secondary | ICD-10-CM | POA: Diagnosis not present

## 2019-10-22 DIAGNOSIS — N183 Chronic kidney disease, stage 3 unspecified: Secondary | ICD-10-CM | POA: Diagnosis not present

## 2019-10-22 DIAGNOSIS — I129 Hypertensive chronic kidney disease with stage 1 through stage 4 chronic kidney disease, or unspecified chronic kidney disease: Secondary | ICD-10-CM | POA: Diagnosis not present

## 2019-10-28 DIAGNOSIS — H524 Presbyopia: Secondary | ICD-10-CM | POA: Diagnosis not present

## 2019-10-28 DIAGNOSIS — H353131 Nonexudative age-related macular degeneration, bilateral, early dry stage: Secondary | ICD-10-CM | POA: Diagnosis not present

## 2019-10-28 DIAGNOSIS — H43813 Vitreous degeneration, bilateral: Secondary | ICD-10-CM | POA: Diagnosis not present

## 2019-10-28 DIAGNOSIS — H35363 Drusen (degenerative) of macula, bilateral: Secondary | ICD-10-CM | POA: Diagnosis not present

## 2019-10-28 DIAGNOSIS — H5211 Myopia, right eye: Secondary | ICD-10-CM | POA: Diagnosis not present

## 2019-10-28 DIAGNOSIS — H0100B Unspecified blepharitis left eye, upper and lower eyelids: Secondary | ICD-10-CM | POA: Diagnosis not present

## 2019-10-28 DIAGNOSIS — H0100A Unspecified blepharitis right eye, upper and lower eyelids: Secondary | ICD-10-CM | POA: Diagnosis not present

## 2019-10-28 DIAGNOSIS — H04123 Dry eye syndrome of bilateral lacrimal glands: Secondary | ICD-10-CM | POA: Diagnosis not present

## 2019-10-28 DIAGNOSIS — H35372 Puckering of macula, left eye: Secondary | ICD-10-CM | POA: Diagnosis not present

## 2019-10-28 DIAGNOSIS — H5202 Hypermetropia, left eye: Secondary | ICD-10-CM | POA: Diagnosis not present

## 2019-10-28 DIAGNOSIS — H52203 Unspecified astigmatism, bilateral: Secondary | ICD-10-CM | POA: Diagnosis not present

## 2019-10-28 DIAGNOSIS — H47093 Other disorders of optic nerve, not elsewhere classified, bilateral: Secondary | ICD-10-CM | POA: Diagnosis not present

## 2019-10-30 DIAGNOSIS — K219 Gastro-esophageal reflux disease without esophagitis: Secondary | ICD-10-CM | POA: Diagnosis not present

## 2019-10-30 DIAGNOSIS — I129 Hypertensive chronic kidney disease with stage 1 through stage 4 chronic kidney disease, or unspecified chronic kidney disease: Secondary | ICD-10-CM | POA: Diagnosis not present

## 2019-10-30 DIAGNOSIS — E785 Hyperlipidemia, unspecified: Secondary | ICD-10-CM | POA: Diagnosis not present

## 2019-10-30 DIAGNOSIS — N183 Chronic kidney disease, stage 3 unspecified: Secondary | ICD-10-CM | POA: Diagnosis not present

## 2019-10-30 DIAGNOSIS — R300 Dysuria: Secondary | ICD-10-CM | POA: Diagnosis not present

## 2019-11-04 ENCOUNTER — Encounter: Payer: Self-pay | Admitting: Adult Health

## 2019-11-04 ENCOUNTER — Ambulatory Visit: Payer: PPO | Admitting: Adult Health

## 2019-11-04 ENCOUNTER — Other Ambulatory Visit: Payer: Self-pay

## 2019-11-04 VITALS — BP 134/82 | HR 60 | Temp 97.4°F | Ht 62.0 in | Wt 110.0 lb

## 2019-11-04 DIAGNOSIS — I63411 Cerebral infarction due to embolism of right middle cerebral artery: Secondary | ICD-10-CM

## 2019-11-04 DIAGNOSIS — I480 Paroxysmal atrial fibrillation: Secondary | ICD-10-CM | POA: Diagnosis not present

## 2019-11-04 DIAGNOSIS — E782 Mixed hyperlipidemia: Secondary | ICD-10-CM

## 2019-11-04 DIAGNOSIS — I10 Essential (primary) hypertension: Secondary | ICD-10-CM

## 2019-11-04 DIAGNOSIS — I69322 Dysarthria following cerebral infarction: Secondary | ICD-10-CM

## 2019-11-04 NOTE — Progress Notes (Signed)
[facility] [address] [place]. [place] [address] [phone]       STROKE FOLLOW UP NOTE  [name] Date of Birth:  29-Oct-1930 Medical Record Number:  [id number]   Reason for Referral: stroke follow up    CHIEF COMPLAINT:  Chief Complaint  Patient presents with  . Follow-up    rm 9, hx of stroke, with daughter     HPI:  Today, 11/04/2019, [name] returns for follow-up regarding right MCA stroke in 03/2019.  Residual deficits of mild left hemiparesis and mild dysarthria with hypophonia.  Continues to ambulate with Rollator walker and denies any recent falls.  Continues to experience chronic left shoulder pain with limited mobility and weakness which has returned to baseline.  Previously seen by orthopedics but has not had follow-up post stroke.  Continues on Eliquis 2.5 mg twice daily and atorvastatin for secondary stroke prevention.  Blood pressure today 134/82.  Repeat CTA showed interval recanalization of previously occluded right M2 branch vessel with unchanged mild proximal left M2 and severe proximal right P3 stenosis.  No concerns at this time.    History provided for reference purposes only Initial visit 07/15/2019 JM: [name] is a 86 year old female who is being seen today for hospital follow-up accompanied by her daughter.  Residual deficits of left hemiparesis, left facial droop and mild dysarthria.  She was discharged from SNF on 07/06/2019 return to her own home where she is currently receiving 24-hour care as well as home health PT/OT.  She was independent prior to her stroke but currently receiving 24-hour care due to safety concerns.  She is able to ambulate with Rollator walker and denies any recent falls.  History of chronic dislocated left shoulder causing increased pain and interfering with recovery.  Daughter is questioning potential benefit of Botox injections as this was discussed with the family member.  Patient and daughter are  questioning repeating imaging due to concern of prior occlusion seen on imaging during hospitalization.  She has continued on Eliquis 2.5 mg twice daily without bleeding or bruising.  She does have established cardiologist but has not had follow-up at this time.  Continues on atorvastatin 20 mg daily without myalgias.  Blood pressure today satisfactory at 125/80.  No further concerns at this time.  Stroke admission 03/26/2019: Ms.Nancy Mooreis a 86 y.o.femalewith history of HTN, CKD, AV stenosis, PSVT/PVCs, hypothyroidism and peripheral neuropathy presented on 03/26/2019 following a fall with slurred speech, left sided facial droop, rightgazepreferenceand left arm weakness.  Stroke work-up showed right MCA infarct as evidenced on MRI due to right distal M2 occlusion as evidenced on CTA embolic pattern secondary to PAF.  Underwent cerebral angiogram which showed occlusion right MCA M2/M3 junction.  2D echo showed an EF greater than 65% without cardiac source of embolus identified.  LE venous Doppler negative for DVT.  History of SVT with PAF and RVR found on telemetry and recommended initiating Eliquis 2.5 mg twice daily.  HTN stable with transient hypotension with sleep and recommended long-term BP goal normotensive range.  LDL 88 and initiated atorvastatin 20 mg daily.  Other stroke risk factors include advanced age, family history of stroke and nonrheumatic AV stenosis but no prior history of stroke.  Other active problems trop 58, hypothyroidism, osteoporosis, leukocytosis, chest x-ray showed small effusion/atelectasis, left humeral head chronically dislocated, left elbow joint effusion w/p acute bony abnormality, GERD on PPI, constipation and use of Celebrex with patient understanding increased risk of bleeding on DOAC.  Residual  deficits include dysphagia, mild dysarthria, left facial droop, incomplete left gaze, and left hemiparesis in therapies recommended discharge to SNF for ongoing  therapy.     ROS:   14 system review of systems performed and negative with exception of weakness, joint pain, soft speech  PMH: No past medical history on file.  PSH:  Past Surgical History:  Procedure Laterality Date  . IR ANGIO INTRA EXTRACRAN SEL COM CAROTID INNOMINATE UNI R MOD SED  03/26/2019  . IR ANGIO VERTEBRAL SEL SUBCLAVIAN INNOMINATE UNI R MOD SED  03/26/2019  . RADIOLOGY WITH ANESTHESIA Left 03/26/2019   Procedure: RADIOLOGY WITH ANESTHESIA;  Surgeon: Luanne Bras, MD;  Location: [facility];  Service: Radiology;  Laterality: Left;    Social History:  Social History   Socioeconomic History  . Marital status: Widowed    Spouse name: Not on file  . Number of children: Not on file  . Years of education: Not on file  . Highest education level: Not on file  Occupational History  . Not on file  Tobacco Use  . Smoking status: Never Smoker  . Smokeless tobacco: Never Used  Substance and Sexual Activity  . Alcohol use: Not on file  . Drug use: Not on file  . Sexual activity: Not on file  Other Topics Concern  . Not on file  Social History Narrative  . Not on file   Social Determinants of Health   Financial Resource Strain:   . Difficulty of Paying Living Expenses:   Food Insecurity:   . Worried About Charity fundraiser in the Last Year:   . Arboriculturist in the Last Year:   Transportation Needs:   . Film/video editor (Medical):   Marland Kitchen Lack of Transportation (Non-Medical):   Physical Activity:   . Days of Exercise per Week:   . Minutes of Exercise per Session:   Stress:   . Feeling of Stress :   Social Connections:   . Frequency of Communication with Friends and Family:   . Frequency of Social Gatherings with Friends and Family:   . Attends Religious Services:   . Active Member of Clubs or Organizations:   . Attends Archivist Meetings:   Marland Kitchen Marital Status:   Intimate Partner Violence:   . Fear of Current or Ex-Partner:   . Emotionally  Abused:   Marland Kitchen Physically Abused:   . Sexually Abused:     Family History: No family history on file.  Medications:   Current Outpatient Medications on File Prior to Visit  Medication Sig Dispense Refill  . acyclovir (ZOVIRAX) 800 MG tablet Take 800 mg by mouth 3 (three) times daily as needed.    Marland Kitchen apixaban (ELIQUIS) 2.5 MG TABS tablet Take 1 tablet (2.5 mg total) by mouth 2 (two) times daily. 60 tablet   . atorvastatin (LIPITOR) 20 MG tablet Take 1 tablet (20 mg total) by mouth daily at 6 PM.    . calcium-vitamin D (OSCAL WITH D) 500-200 MG-UNIT TABS tablet Take by mouth.    . Carboxymethylcell-Hypromellose 0.25-0.3 % GEL Place 1 drop into both eyes daily as needed (dry eyes).    . flecainide (TAMBOCOR) 50 MG tablet SMARTSIG:1 Tablet(s) By Mouth Every 12 Hours    . hydrocortisone (ANUSOL-HC) 25 MG suppository Place 25 mg rectally 2 (two) times daily. PRN    . ipratropium (ATROVENT) 0.06 % nasal spray Place 2 sprays into both nostrils 4 (four) times daily as needed for rhinitis.    Marland Kitchen  levothyroxine (SYNTHROID) 112 MCG tablet Take 1 tablet (112 mcg total) by mouth daily.    . Maltodextrin-Xanthan Gum (RESOURCE THICKENUP CLEAR) POWD Take 120 g by mouth as needed (dysphagia - for honey thick liquid consistency).    . metoprolol tartrate (LOPRESSOR) 50 MG tablet Take 25 mg by mouth 2 (two) times daily.    . mometasone (NASONEX) 50 MCG/ACT nasal spray Place 2 sprays into the nose daily.    . Multiple Vitamin (MULTIVITAMIN) capsule Take 1 capsule by mouth daily.    Marland Kitchen omeprazole (PRILOSEC) 40 MG capsule Take 40 mg by mouth 2 (two) times daily.    . phenazopyridine (PYRIDIUM) 95 MG tablet Take 95 mg by mouth 3 (three) times daily as needed for pain. PRN    . polyethylene glycol (MIRALAX / GLYCOLAX) 17 g packet Take 17 g by mouth daily as needed for constipation.    . traMADol (ULTRAM) 50 MG tablet Take 50 mg by mouth 2 (two) times daily as needed for pain.    Marland Kitchen triamcinolone cream (KENALOG) 0.1 %  Apply 1 application topically 2 (two) times daily.    . vitamin C (ASCORBIC ACID) 500 MG tablet Take 500 mg by mouth daily.    . Vitamin D, Ergocalciferol, (DRISDOL) 1.25 MG (50000 UT) CAPS capsule Take 50,000 Units by mouth once a week.     No current facility-administered medications on file prior to visit.    Allergies:   Allergies  Allergen Reactions  . Chocolate     unknown  . Gabapentin     unknown  . Penicillins     Unknown  ( not found in DR records sent from [facility])  . Tetracyclines & Related     unknown  . Tape Rash    Tears skin     Physical Exam  Vitals:   11/04/19 0813  BP: 134/82  Pulse: 60  Temp: (!) 97.4 F (36.3 C)  Weight: 110 lb (49.9 kg)  Height: 5\' 2"  (1.575 m)   Body mass index is 20.12 kg/m. No exam data present   General: Frail pleasant elderly Caucasian female, seated, in no evident distress Head: head normocephalic and atraumatic.   Neck: supple with no carotid or supraclavicular bruits Cardiovascular: irregular rate and rhythm, no murmurs Musculoskeletal: no deformity Skin:  no rash/petichiae Vascular:  Normal pulses all extremities   Neurologic Exam Mental Status: Awake and fully alert.   Mild dysarthria with hypophonia.  Oriented to place and time. Recent and remote memory intact. Attention span, concentration and fund of knowledge appropriate. Mood and affect appropriate.  Cranial Nerves: Pupils equal, briskly reactive to light. Extraocular movements full without nystagmus. Visual fields full to confrontation. Hearing intact. Facial sensation intact.  Mild left lower facial weakness Motor:  LUE: 4/5 due to chronic left shoulder injury/pain LLE: 4/5 hip flexor weakness Adequate strength right upper and lower extremity Sensory.: intact to touch , pinprick , position and vibratory sensation.  Coordination: Rapid alternating movements normal in all extremities except mild decreased left finger tapping. Finger-to-nose and heel-to-shin  unable to evaluate due to weakness and pain. Gait and Station: Arises from chair with mild difficulty. Stance is slightly hunched. Gait demonstrates normal stride length and balance with use of Rollator walker Reflexes: 1+ and symmetric. Toes downgoing.         ASSESSMENT: Kiyana Briscoe is a 86 y.o. year old female presented after a fall with slurred speech, left-sided facial droop, right gaze preference and left arm weakness on 03/26/2019  with stroke work-up showing right MCA infarct due to right distal M2 occlusion embolic secondary to new onset PAF.  She was started on Eliquis 2.5 twice daily.  Repeat CTA stable.  Vascular risk factors include new onset PAF with RVR, history of SVT, HTN, HLD, advanced age, family history of stroke and nonrheumatic AV stenosis.  Residual deficits of mild left hemiparesis and mild dysarthria with hypophonia    PLAN:  1. Right MCA stroke:  -Continue Eliquis (apixaban) daily  and atorvastatin for secondary stroke prevention.  -Maintain strict control of hypertension with blood pressure goal below 130/90, diabetes with hemoglobin A1c goal below 6.5% and cholesterol with LDL cholesterol (bad cholesterol) goal below 70 mg/dL.  I also advised the patient to eat a healthy diet with plenty of whole grains, cereals, fruits and vegetables, exercise regularly with at least 30 minutes of continuous activity daily and maintain ideal body weight.  2. PAF: Continue Eliquis 2.5 mg twice daily and ongoing follow-up with cardiology for prescribing, monitoring and management 3. HTN: Ongoing follow-up with PCP for monitoring and management 4. HLD: Continuation of atorvastatin and follow-up with PCP for prescribing, monitoring and management     Follow up in 6 months or call earlier if needed   I spent 29 minutes of face-to-face and non-face-to-face time with patient and daughter.  This included previsit chart review, lab review, study review, order entry, electronic health  record documentation, patient education regarding history of right MCA stroke secondary to new diagnosis of PAF, importance of managing stroke risk factors and answered all questions to patient satisfaction    Frann Rider, [facility]  [facility] [address] [place], [place] [address]  Phone [phone] Fax [phone] Note: This document was prepared with digital dictation and possible smart phrase technology. Any transcriptional errors that result from this process are unintentional.

## 2019-11-04 NOTE — Progress Notes (Signed)
I agree with the above plan 

## 2019-11-04 NOTE — Patient Instructions (Signed)
Continue Eliquis (apixaban) daily  and atorvastatin.  For secondary stroke prevention  Continue to follow up with PCP regarding cholesterol and blood pressure management   Continue to follow with eye doctor for monitoring and management   Maintain strict control of hypertension with blood pressure goal below 130/90, diabetes with hemoglobin A1c goal below 6.5% and cholesterol with LDL cholesterol (bad cholesterol) goal below 70 mg/dL. I also advised the patient to eat a healthy diet with plenty of whole grains, cereals, fruits and vegetables, exercise regularly and maintain ideal body weight.  Followup in the future with me in 6 months or call earlier if needed       Thank you for coming to see [place] at [facility]. I hope we have been able to provide you high quality care today.  You may receive a patient satisfaction survey over the next few weeks. We would appreciate your feedback and comments so that we may continue to improve ourselves and the health of our patients.

## 2019-11-17 DIAGNOSIS — I69354 Hemiplegia and hemiparesis following cerebral infarction affecting left non-dominant side: Secondary | ICD-10-CM | POA: Diagnosis not present

## 2019-11-17 DIAGNOSIS — I69398 Other sequelae of cerebral infarction: Secondary | ICD-10-CM | POA: Diagnosis not present

## 2019-11-17 DIAGNOSIS — R2689 Other abnormalities of gait and mobility: Secondary | ICD-10-CM | POA: Diagnosis not present

## 2019-11-17 DIAGNOSIS — M12812 Other specific arthropathies, not elsewhere classified, left shoulder: Secondary | ICD-10-CM | POA: Diagnosis not present

## 2019-11-17 DIAGNOSIS — M85812 Other specified disorders of bone density and structure, left shoulder: Secondary | ICD-10-CM | POA: Diagnosis not present

## 2019-12-19 DIAGNOSIS — R350 Frequency of micturition: Secondary | ICD-10-CM | POA: Diagnosis not present

## 2019-12-19 DIAGNOSIS — N3001 Acute cystitis with hematuria: Secondary | ICD-10-CM | POA: Diagnosis not present

## 2019-12-26 DIAGNOSIS — N390 Urinary tract infection, site not specified: Secondary | ICD-10-CM | POA: Diagnosis not present

## 2019-12-26 DIAGNOSIS — N183 Chronic kidney disease, stage 3 unspecified: Secondary | ICD-10-CM | POA: Diagnosis not present

## 2019-12-26 DIAGNOSIS — F4321 Adjustment disorder with depressed mood: Secondary | ICD-10-CM | POA: Diagnosis not present

## 2019-12-26 DIAGNOSIS — E039 Hypothyroidism, unspecified: Secondary | ICD-10-CM | POA: Diagnosis not present

## 2019-12-26 DIAGNOSIS — I129 Hypertensive chronic kidney disease with stage 1 through stage 4 chronic kidney disease, or unspecified chronic kidney disease: Secondary | ICD-10-CM | POA: Diagnosis not present

## 2019-12-26 DIAGNOSIS — R410 Disorientation, unspecified: Secondary | ICD-10-CM | POA: Diagnosis not present

## 2020-01-06 DIAGNOSIS — I129 Hypertensive chronic kidney disease with stage 1 through stage 4 chronic kidney disease, or unspecified chronic kidney disease: Secondary | ICD-10-CM | POA: Diagnosis not present

## 2020-01-06 DIAGNOSIS — R413 Other amnesia: Secondary | ICD-10-CM | POA: Diagnosis not present

## 2020-01-06 DIAGNOSIS — N183 Chronic kidney disease, stage 3 unspecified: Secondary | ICD-10-CM | POA: Diagnosis not present

## 2020-01-06 DIAGNOSIS — N390 Urinary tract infection, site not specified: Secondary | ICD-10-CM | POA: Diagnosis not present

## 2020-02-09 DIAGNOSIS — H0100B Unspecified blepharitis left eye, upper and lower eyelids: Secondary | ICD-10-CM | POA: Diagnosis not present

## 2020-02-09 DIAGNOSIS — H35363 Drusen (degenerative) of macula, bilateral: Secondary | ICD-10-CM | POA: Diagnosis not present

## 2020-02-09 DIAGNOSIS — H52203 Unspecified astigmatism, bilateral: Secondary | ICD-10-CM | POA: Diagnosis not present

## 2020-02-09 DIAGNOSIS — H524 Presbyopia: Secondary | ICD-10-CM | POA: Diagnosis not present

## 2020-02-09 DIAGNOSIS — H353131 Nonexudative age-related macular degeneration, bilateral, early dry stage: Secondary | ICD-10-CM | POA: Diagnosis not present

## 2020-02-09 DIAGNOSIS — H5203 Hypermetropia, bilateral: Secondary | ICD-10-CM | POA: Diagnosis not present

## 2020-02-09 DIAGNOSIS — H0100A Unspecified blepharitis right eye, upper and lower eyelids: Secondary | ICD-10-CM | POA: Diagnosis not present

## 2020-02-09 DIAGNOSIS — H04123 Dry eye syndrome of bilateral lacrimal glands: Secondary | ICD-10-CM | POA: Diagnosis not present

## 2020-02-17 DIAGNOSIS — Z881 Allergy status to other antibiotic agents status: Secondary | ICD-10-CM | POA: Diagnosis not present

## 2020-02-17 DIAGNOSIS — M19012 Primary osteoarthritis, left shoulder: Secondary | ICD-10-CM | POA: Diagnosis not present

## 2020-02-17 DIAGNOSIS — Z885 Allergy status to narcotic agent status: Secondary | ICD-10-CM | POA: Diagnosis not present

## 2020-02-17 DIAGNOSIS — M1712 Unilateral primary osteoarthritis, left knee: Secondary | ICD-10-CM | POA: Diagnosis not present

## 2020-02-17 DIAGNOSIS — M12812 Other specific arthropathies, not elsewhere classified, left shoulder: Secondary | ICD-10-CM | POA: Diagnosis not present

## 2020-02-17 DIAGNOSIS — Z888 Allergy status to other drugs, medicaments and biological substances status: Secondary | ICD-10-CM | POA: Diagnosis not present

## 2020-02-17 DIAGNOSIS — M11262 Other chondrocalcinosis, left knee: Secondary | ICD-10-CM | POA: Diagnosis not present

## 2020-02-17 DIAGNOSIS — Z8673 Personal history of transient ischemic attack (TIA), and cerebral infarction without residual deficits: Secondary | ICD-10-CM | POA: Diagnosis not present

## 2020-03-02 DIAGNOSIS — Z8673 Personal history of transient ischemic attack (TIA), and cerebral infarction without residual deficits: Secondary | ICD-10-CM | POA: Diagnosis not present

## 2020-03-02 DIAGNOSIS — G3184 Mild cognitive impairment, so stated: Secondary | ICD-10-CM | POA: Diagnosis not present

## 2020-03-19 DIAGNOSIS — R300 Dysuria: Secondary | ICD-10-CM | POA: Diagnosis not present

## 2020-04-07 DIAGNOSIS — E559 Vitamin D deficiency, unspecified: Secondary | ICD-10-CM | POA: Diagnosis not present

## 2020-04-07 DIAGNOSIS — F4321 Adjustment disorder with depressed mood: Secondary | ICD-10-CM | POA: Diagnosis not present

## 2020-04-07 DIAGNOSIS — R300 Dysuria: Secondary | ICD-10-CM | POA: Diagnosis not present

## 2020-04-07 DIAGNOSIS — E785 Hyperlipidemia, unspecified: Secondary | ICD-10-CM | POA: Diagnosis not present

## 2020-04-07 DIAGNOSIS — Z23 Encounter for immunization: Secondary | ICD-10-CM | POA: Diagnosis not present

## 2020-04-07 DIAGNOSIS — E039 Hypothyroidism, unspecified: Secondary | ICD-10-CM | POA: Diagnosis not present

## 2020-04-07 DIAGNOSIS — N183 Chronic kidney disease, stage 3 unspecified: Secondary | ICD-10-CM | POA: Diagnosis not present

## 2020-04-07 DIAGNOSIS — I129 Hypertensive chronic kidney disease with stage 1 through stage 4 chronic kidney disease, or unspecified chronic kidney disease: Secondary | ICD-10-CM | POA: Diagnosis not present

## 2020-04-13 DIAGNOSIS — K648 Other hemorrhoids: Secondary | ICD-10-CM | POA: Diagnosis not present

## 2020-04-13 DIAGNOSIS — K921 Melena: Secondary | ICD-10-CM | POA: Diagnosis not present

## 2020-04-21 DIAGNOSIS — K921 Melena: Secondary | ICD-10-CM | POA: Diagnosis not present

## 2020-04-21 DIAGNOSIS — D500 Iron deficiency anemia secondary to blood loss (chronic): Secondary | ICD-10-CM | POA: Diagnosis not present

## 2020-04-27 DIAGNOSIS — N183 Chronic kidney disease, stage 3 unspecified: Secondary | ICD-10-CM | POA: Diagnosis not present

## 2020-04-27 DIAGNOSIS — I129 Hypertensive chronic kidney disease with stage 1 through stage 4 chronic kidney disease, or unspecified chronic kidney disease: Secondary | ICD-10-CM | POA: Diagnosis not present

## 2020-04-27 DIAGNOSIS — Z7189 Other specified counseling: Secondary | ICD-10-CM | POA: Diagnosis not present

## 2020-04-27 DIAGNOSIS — K921 Melena: Secondary | ICD-10-CM | POA: Diagnosis not present

## 2020-04-27 DIAGNOSIS — D649 Anemia, unspecified: Secondary | ICD-10-CM | POA: Diagnosis not present

## 2020-04-28 DIAGNOSIS — H353131 Nonexudative age-related macular degeneration, bilateral, early dry stage: Secondary | ICD-10-CM | POA: Diagnosis not present

## 2020-04-28 DIAGNOSIS — H35363 Drusen (degenerative) of macula, bilateral: Secondary | ICD-10-CM | POA: Diagnosis not present

## 2020-04-28 DIAGNOSIS — H0100A Unspecified blepharitis right eye, upper and lower eyelids: Secondary | ICD-10-CM | POA: Diagnosis not present

## 2020-04-28 DIAGNOSIS — H0100B Unspecified blepharitis left eye, upper and lower eyelids: Secondary | ICD-10-CM | POA: Diagnosis not present

## 2020-04-28 DIAGNOSIS — H04123 Dry eye syndrome of bilateral lacrimal glands: Secondary | ICD-10-CM | POA: Diagnosis not present

## 2020-05-04 ENCOUNTER — Encounter: Payer: Self-pay | Admitting: Adult Health

## 2020-05-04 ENCOUNTER — Other Ambulatory Visit: Payer: Self-pay

## 2020-05-04 ENCOUNTER — Ambulatory Visit: Payer: PPO | Admitting: Adult Health

## 2020-05-04 VITALS — BP 126/70 | HR 68 | Ht 61.0 in | Wt 116.0 lb

## 2020-05-04 DIAGNOSIS — I480 Paroxysmal atrial fibrillation: Secondary | ICD-10-CM

## 2020-05-04 DIAGNOSIS — E782 Mixed hyperlipidemia: Secondary | ICD-10-CM | POA: Diagnosis not present

## 2020-05-04 DIAGNOSIS — I10 Essential (primary) hypertension: Secondary | ICD-10-CM

## 2020-05-04 DIAGNOSIS — I63411 Cerebral infarction due to embolism of right middle cerebral artery: Secondary | ICD-10-CM

## 2020-05-04 NOTE — Progress Notes (Signed)
[facility] [address] [place]. [place] [address] [phone]       STROKE FOLLOW UP NOTE  [name] Date of Birth:  15-Jun-1931 Medical Record Number:  [id number]   Reason for Referral: stroke follow up    CHIEF COMPLAINT:  Chief Complaint  Patient presents with  . Follow-up    rm 9  . Cerebrovascular Accident    pt is having no new sx. Pt is here with her son.    HPI:  Today, 05/04/2020, Nancy Zamora is being seen for stroke follow-up accompanied by her son.  Reports continued left-sided weakness which has been stable without worsening.  She continues to be followed closely by orthopedics for chronic left shoulder pain.  Continues to use Rollator walker at all times and denies any recent falls.  Denies residual dysarthria but is concerned regarding continued hypophonia.  Continues to receive 24/7 care at home for assistance with ADLs and IADLs.  Denies new or worsening stroke/TIA symptoms.  Remains on Eliquis 2.5 mg twice daily without bruising or bleeding.  Recently evaluated by GI for 2 days of hematochezia in September and mild anemia asymptomatic.  No recent hematochezia or evidence of bleeding.  Continues on atorvastatin 20 mg daily without myalgias.  Blood pressure today 126/70.  Monitors at home which has been stable.  No further concerns at this time.    History provided for reference purposes only Update 11/04/2019 JM:, Nancy Zamora returns for follow-up regarding right MCA stroke in 03/2019.  Residual deficits of mild left hemiparesis and mild dysarthria with hypophonia.  Continues to ambulate with Rollator walker and denies any recent falls.  Continues to experience chronic left shoulder pain with limited mobility and weakness which has returned to baseline.  Previously seen by orthopedics but has not had follow-up post stroke.  Continues on Eliquis 2.5 mg twice daily and atorvastatin for secondary stroke prevention.  Blood pressure today 134/82.   Repeat CTA showed interval recanalization of previously occluded right M2 branch vessel with unchanged mild proximal left M2 and severe proximal right P3 stenosis.  No concerns at this time.  Initial visit 07/15/2019 JM: Nancy Zamora is a 86 year old female who is being seen today for hospital follow-up accompanied by her daughter.  Residual deficits of left hemiparesis, left facial droop and mild dysarthria.  She was discharged from SNF on 07/06/2019 return to her own home where she is currently receiving 24-hour care as well as home health PT/OT.  She was independent prior to her stroke but currently receiving 24-hour care due to safety concerns.  She is able to ambulate with Rollator walker and denies any recent falls.  History of chronic dislocated left shoulder causing increased pain and interfering with recovery.  Daughter is questioning potential benefit of Botox injections as this was discussed with the family member.  Patient and daughter are questioning repeating imaging due to concern of prior occlusion seen on imaging during hospitalization.  She has continued on Eliquis 2.5 mg twice daily without bleeding or bruising.  She does have established cardiologist but has not had follow-up at this time.  Continues on atorvastatin 20 mg daily without myalgias.  Blood pressure today satisfactory at 125/80.  No further concerns at this time.  Stroke admission 03/26/2019: NancyNancy Mooreis a 86 y.o.femalewith history of HTN, CKD, AV stenosis, PSVT/PVCs, hypothyroidism and peripheral neuropathy presented on 03/26/2019 following a fall with slurred speech, left sided facial droop, rightgazepreferenceand left arm weakness.  Stroke work-up showed right MCA  infarct as evidenced on MRI due to right distal M2 occlusion as evidenced on CTA embolic pattern secondary to PAF.  Underwent cerebral angiogram which showed occlusion right MCA M2/M3 junction.  2D echo showed an EF greater than 65% without cardiac source of  embolus identified.  LE venous Doppler negative for DVT.  History of SVT with PAF and RVR found on telemetry and recommended initiating Eliquis 2.5 mg twice daily.  HTN stable with transient hypotension with sleep and recommended long-term BP goal normotensive range.  LDL 88 and initiated atorvastatin 20 mg daily.  Other stroke risk factors include advanced age, family history of stroke and nonrheumatic AV stenosis but no prior history of stroke.  Other active problems trop 58, hypothyroidism, osteoporosis, leukocytosis, chest x-ray showed small effusion/atelectasis, left humeral head chronically dislocated, left elbow joint effusion w/p acute bony abnormality, GERD on PPI, constipation and use of Celebrex with patient understanding increased risk of bleeding on DOAC.  Residual deficits include dysphagia, mild dysarthria, left facial droop, incomplete left gaze, and left hemiparesis in therapies recommended discharge to SNF for ongoing therapy.     ROS:   14 system review of systems performed and negative with exception of weakness, joint pain, soft speech  PMH: No past medical history on file.  PSH:  Past Surgical History:  Procedure Laterality Date  . IR ANGIO INTRA EXTRACRAN SEL COM CAROTID INNOMINATE UNI R MOD SED  03/26/2019  . IR ANGIO VERTEBRAL SEL SUBCLAVIAN INNOMINATE UNI R MOD SED  03/26/2019  . RADIOLOGY WITH ANESTHESIA Left 03/26/2019   Procedure: RADIOLOGY WITH ANESTHESIA;  Surgeon: Luanne Bras, MD;  Location: [facility];  Service: Radiology;  Laterality: Left;    Social History:  Social History   Socioeconomic History  . Marital status: Widowed    Spouse name: Not on file  . Number of children: Not on file  . Years of education: Not on file  . Highest education level: Not on file  Occupational History  . Not on file  Tobacco Use  . Smoking status: Never Smoker  . Smokeless tobacco: Never Used  Substance and Sexual Activity  . Alcohol use: Not on file  . Drug use: Not on  file  . Sexual activity: Not on file  Other Topics Concern  . Not on file  Social History Narrative  . Not on file   Social Determinants of Health   Financial Resource Strain:   . Difficulty of Paying Living Expenses: Not on file  Food Insecurity:   . Worried About Charity fundraiser in the Last Year: Not on file  . Ran Out of Food in the Last Year: Not on file  Transportation Needs:   . Lack of Transportation (Medical): Not on file  . Lack of Transportation (Non-Medical): Not on file  Physical Activity:   . Days of Exercise per Week: Not on file  . Minutes of Exercise per Session: Not on file  Stress:   . Feeling of Stress : Not on file  Social Connections:   . Frequency of Communication with Friends and Family: Not on file  . Frequency of Social Gatherings with Friends and Family: Not on file  . Attends Religious Services: Not on file  . Active Member of Clubs or Organizations: Not on file  . Attends Archivist Meetings: Not on file  . Marital Status: Not on file  Intimate Partner Violence:   . Fear of Current or Ex-Partner: Not on file  . Emotionally Abused:  Not on file  . Physically Abused: Not on file  . Sexually Abused: Not on file    Family History: No family history on file.  Medications:   Current Outpatient Medications on File Prior to Visit  Medication Sig Dispense Refill  . acyclovir (ZOVIRAX) 800 MG tablet Take 800 mg by mouth 3 (three) times daily as needed.    Marland Kitchen apixaban (ELIQUIS) 2.5 MG TABS tablet Take 1 tablet (2.5 mg total) by mouth 2 (two) times daily. 60 tablet   . atorvastatin (LIPITOR) 20 MG tablet Take 1 tablet (20 mg total) by mouth daily at 6 PM.    . calcium-vitamin D (OSCAL WITH D) 500-200 MG-UNIT TABS tablet Take by mouth.    . Carboxymethylcell-Hypromellose 0.25-0.3 % GEL Place 1 drop into both eyes daily as needed (dry eyes).    . flecainide (TAMBOCOR) 50 MG tablet SMARTSIG:1 Tablet(s) By Mouth Every 12 Hours    . hydrocortisone  (ANUSOL-HC) 25 MG suppository Place 25 mg rectally 2 (two) times daily. PRN    . ipratropium (ATROVENT) 0.06 % nasal spray Place 2 sprays into both nostrils 4 (four) times daily as needed for rhinitis.    Marland Kitchen levothyroxine (SYNTHROID) 112 MCG tablet Take 1 tablet (112 mcg total) by mouth daily.    . metoprolol tartrate (LOPRESSOR) 50 MG tablet Take 25 mg by mouth 2 (two) times daily.    . mometasone (NASONEX) 50 MCG/ACT nasal spray Place 2 sprays into the nose daily.    . Multiple Vitamin (MULTIVITAMIN) capsule Take 1 capsule by mouth daily.    Marland Kitchen omeprazole (PRILOSEC) 40 MG capsule Take 40 mg by mouth 2 (two) times daily.    . phenazopyridine (PYRIDIUM) 95 MG tablet Take 95 mg by mouth 3 (three) times daily as needed for pain. PRN    . polyethylene glycol (MIRALAX / GLYCOLAX) 17 g packet Take 17 g by mouth daily as needed for constipation.    . traMADol (ULTRAM) 50 MG tablet Take 50 mg by mouth 2 (two) times daily as needed for pain.    Marland Kitchen triamcinolone cream (KENALOG) 0.1 % Apply 1 application topically 2 (two) times daily.    . vitamin C (ASCORBIC ACID) 500 MG tablet Take 500 mg by mouth daily.     No current facility-administered medications on file prior to visit.    Allergies:   Allergies  Allergen Reactions  . Chocolate     unknown  . Gabapentin     unknown  . Penicillins     Unknown  ( not found in DR records [facility] from [facility])  . Tetracyclines & Related     unknown  . Tape Rash    Tears skin     Physical Exam  Vitals:   05/04/20 0937  BP: 126/70  Pulse: 68  Weight: 116 lb (52.6 kg)  Height: 5\' 1"  (1.549 m)   Body mass index is 21.92 kg/m. No exam data present   General: Frail pleasant elderly Caucasian female, seated, in no evident distress Head: head normocephalic and atraumatic.   Neck: supple with no carotid or supraclavicular bruits Cardiovascular: irregular rate and rhythm, no murmurs Musculoskeletal: no deformity Skin:  no rash/petichiae Vascular:   Normal pulses all extremities   Neurologic Exam Mental Status: Awake and fully alert.   Mild hypophonia.  No evidence of slurring or word finding difficulty.  Oriented to place and time. Recent and remote memory intact. Attention span, concentration and fund of knowledge appropriate. Mood and affect appropriate.  Cranial Nerves: Pupils equal, briskly reactive to light. Extraocular movements full without nystagmus. Visual fields full to confrontation. Hearing intact. Facial sensation intact.  Mild left lower facial weakness Motor: Mild left hemiparesis but difficulty fully evaluating due to left shoulder and left knee pain.  Full strength right upper and lower extremity. Sensory.: intact to touch , pinprick , position and vibratory sensation.  Coordination: Rapid alternating movements normal in all extremities except mild decreased left finger tapping. Finger-to-nose and heel-to-shin performed adequately right side and unable to evaluate left side due to pain Gait and Station: Arises from chair with mild difficulty. Stance is slightly hunched. Gait demonstrates normal stride length and balance with use of Rollator walker Reflexes: 1+ and symmetric. Toes downgoing.         ASSESSMENT: Cherith Tewell is a 86 y.o. year old female presented after a fall with slurred speech, left-sided facial droop, right gaze preference and left arm weakness on 03/26/2019 with stroke work-up showing right MCA infarct due to right distal M2 occlusion embolic secondary to new onset PAF.  She was started on Eliquis 2.5 twice daily.  Repeat CTA stable.  Vascular risk factors include new onset PAF with RVR, history of SVT, HTN, HLD, advanced age, family history of stroke and nonrheumatic AV stenosis.     PLAN:  1. Right MCA stroke:  a. Residual mild left hemiparesis and hypophonia.  Discussed importance of use of rolling walker at all times for fall prevention.  Offered evaluation by speech therapy as she is concerned  regarding hypophonia -she will further discuss with her family at home and call office if interested in pursuing.   b. Continue Eliquis (apixaban) daily  and atorvastatin for secondary stroke prevention.  c. Discussed secondary stroke prevention measures and importance of close PCP follow-up for aggressive stroke risk factor management 2. PAF:  a. Continue Eliquis 2.5 mg twice daily  b. Managed and monitored by cardiology 3. HTN: BP goal<130/90.  Stable.  Managed by cardiology/PCP 4. HLD: LDL goal<70.  Continue atorvastatin and routine monitoring and rx management by PCP 5. Mild anemia: Continue to follow with PCP and GI for routine monitoring.  Currently asymptomatic and stable.     Follow up in 6 months or call earlier if needed   I spent 30 minutes of face-to-face and non-face-to-face time with patient and daughter.  This included previsit chart review, lab review, study review, order entry, electronic health record documentation, patient education regarding history of right MCA stroke secondary to new diagnosis of PAF, residual deficits, recent diagnosis of mild anemia, importance of managing stroke risk factors and answered all questions to patient and son's satisfaction   Frann Rider, AGNP-BC  [facility] [address] [place], [place] [address]  Phone [phone] Fax [phone] Note: This document was prepared with digital dictation and possible smart phrase technology. Any transcriptional errors that result from this process are unintentional.

## 2020-05-04 NOTE — Patient Instructions (Signed)
Continue Eliquis (apixaban) daily  and atorvastatin for secondary stroke prevention  Continue to follow with cardiology for atrial fibrillation and Eliquis monitoring and management  Continue to follow with PCP/GI regarding anemia  Please call office if interested in further evaluation by speech therapy  Continue to follow up with PCP regarding cholesterol and blood pressure management  Maintain strict control of hypertension with blood pressure goal below 130/90 and cholesterol with LDL cholesterol (bad cholesterol) goal below 70 mg/dL.     Followup in the future with me in 6 months or call earlier if needed       Thank you for coming to see [place] at [facility]. I hope we have been able to provide you high quality care today.  You may receive a patient satisfaction survey over the next few weeks. We would appreciate your feedback and comments so that we may continue to improve ourselves and the health of our patients.

## 2020-05-07 NOTE — Progress Notes (Signed)
I agree with the above plan 

## 2020-05-18 DIAGNOSIS — M19012 Primary osteoarthritis, left shoulder: Secondary | ICD-10-CM | POA: Diagnosis not present

## 2020-05-18 DIAGNOSIS — M1711 Unilateral primary osteoarthritis, right knee: Secondary | ICD-10-CM | POA: Diagnosis not present

## 2020-05-18 DIAGNOSIS — M12812 Other specific arthropathies, not elsewhere classified, left shoulder: Secondary | ICD-10-CM | POA: Diagnosis not present

## 2020-06-04 DIAGNOSIS — F0151 Vascular dementia with behavioral disturbance: Secondary | ICD-10-CM | POA: Diagnosis not present

## 2020-06-04 DIAGNOSIS — S81801A Unspecified open wound, right lower leg, initial encounter: Secondary | ICD-10-CM | POA: Diagnosis not present

## 2020-06-04 DIAGNOSIS — E785 Hyperlipidemia, unspecified: Secondary | ICD-10-CM | POA: Diagnosis not present

## 2020-06-04 DIAGNOSIS — D649 Anemia, unspecified: Secondary | ICD-10-CM | POA: Diagnosis not present

## 2020-06-04 DIAGNOSIS — R350 Frequency of micturition: Secondary | ICD-10-CM | POA: Diagnosis not present

## 2020-06-06 IMAGING — MR MR HEAD W/O CM
12 of 13 series · 43 of 48 positions shown · non-contrast
Comparison: Head CT 03/26/2019

CLINICAL DATA: Slurred speech with left-sided facial droop

EXAM:
MRI HEAD WITHOUT CONTRAST
TECHNIQUE: Multiplanar, multiecho pulse sequences of the brain and surrounding
structures were obtained without intravenous contrast.

[Series 5: DWI · axial · 3.0mm · 0.88mm/px · z∈[-74,+61]mm · 7 of 94 slices shown (1 of 4)]
[im 1/94]
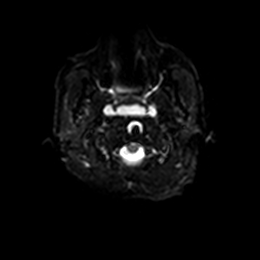
[im 16/94]
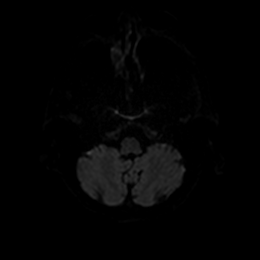
[im 32/94]
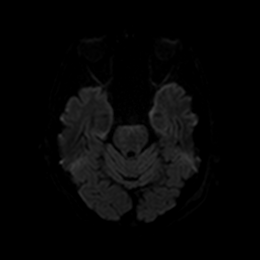
[im 47/94]
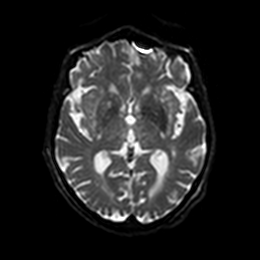
[im 63/94]
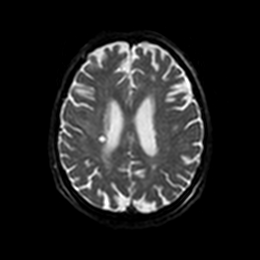
[im 78/94]
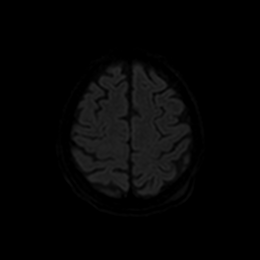
[im 94/94]
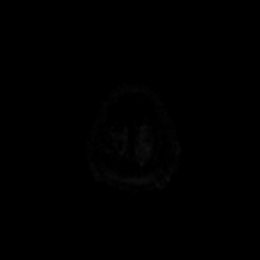

[Series 6: DWI · axial · 3.0mm · 0.88mm/px · z∈[-74,+61]mm · 3 of 45 slices shown (2 of 4)]
[im 1/45]
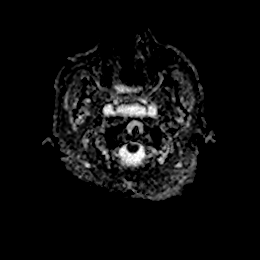
[im 23/45]
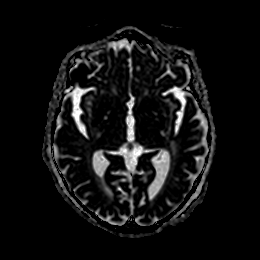
[im 45/45]
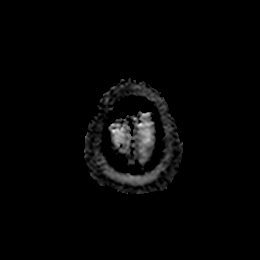

[Series 7: DWI · coronal · 4.0mm · 0.88mm/px · 5 of 68 slices shown (3 of 4)]
[im 1/68]
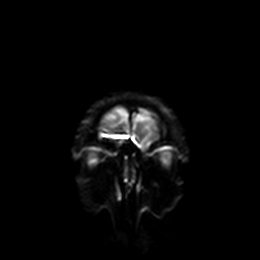
[im 17/68]
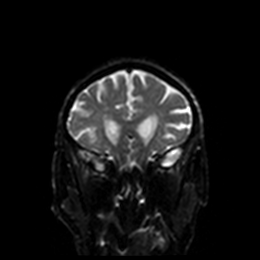
[im 34/68]
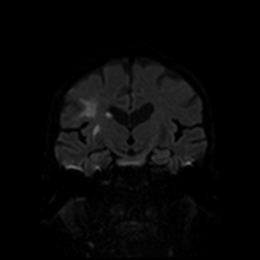
[im 51/68]
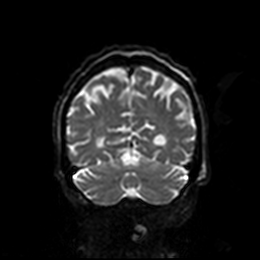
[im 68/68]
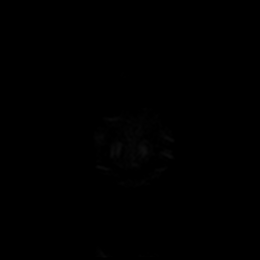

[Series 8: DWI · coronal · 4.0mm · 0.88mm/px · 2 of 32 slices shown (4 of 4)]
[im 1/32]
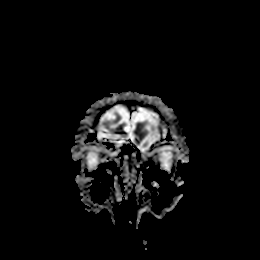
[im 32/32]
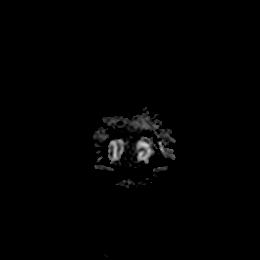

[Series 9: T1 · sagittal · 5.0mm · 0.75mm/px · 2 of 23 slices shown]
[im 1/23]
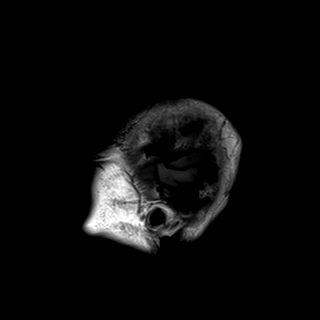
[im 23/23]
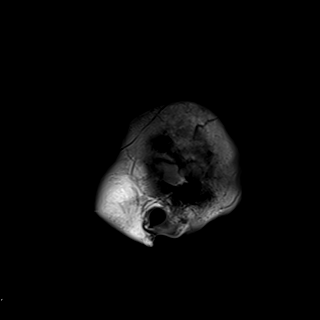

[Series 10: T2 · axial · 5.0mm · 0.72mm/px · z∈[-77,+64]mm · 2 of 25 slices shown (1 of 2)]
[im 1/25]
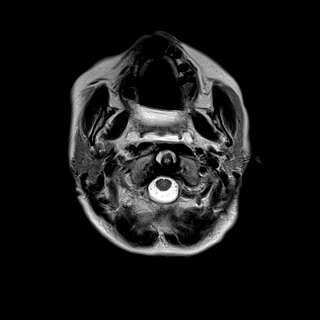
[im 25/25]
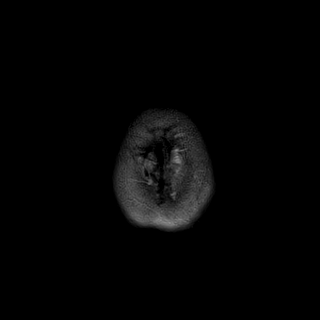

[Series 11: FLAIR · axial · 5.0mm · 0.45mm/px · z∈[-76,+65]mm · 2 of 25 slices shown]
[im 1/25]
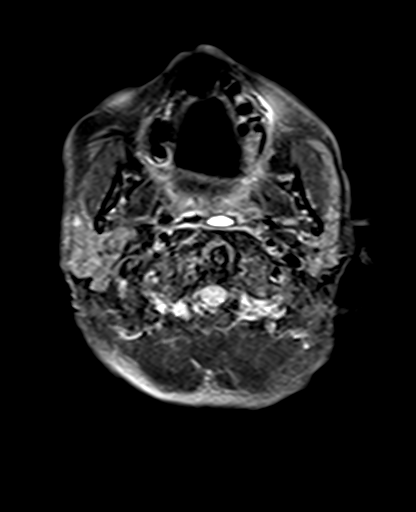
[im 25/25]
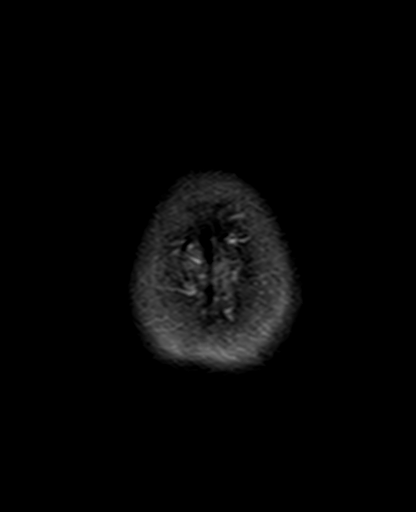

[Series 12: mag_images · axial · 3.0mm · 0.90mm/px · z∈[-87,+86]mm · 5 of 60 slices shown]
[im 1/60]
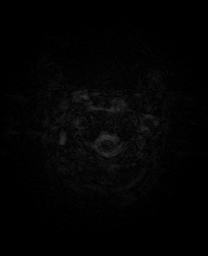
[im 15/60]
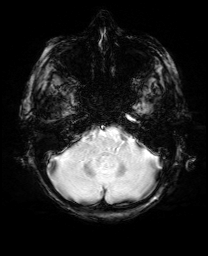
[im 30/60]
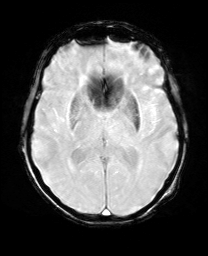
[im 45/60]
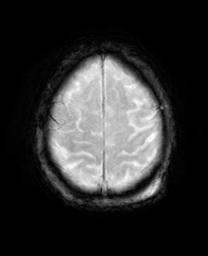
[im 60/60]
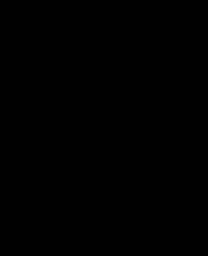

[Series 13: pha_images · axial · 3.0mm · 0.90mm/px · z∈[-87,+69]mm · 4 of 54 slices shown]
[im 1/54]
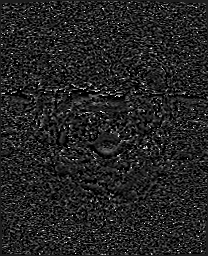
[im 18/54]
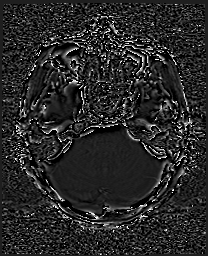
[im 36/54]
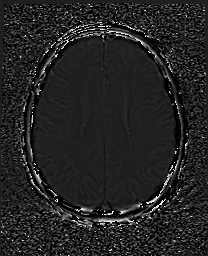
[im 54/54]
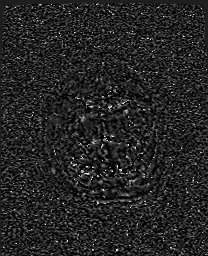

[Series 14: swi_images · axial · 3.0mm · 0.90mm/px · z∈[-87,+86]mm · 5 of 60 slices shown]
[im 1/60]
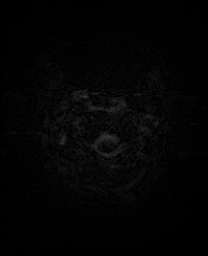
[im 15/60]
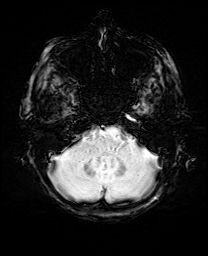
[im 30/60]
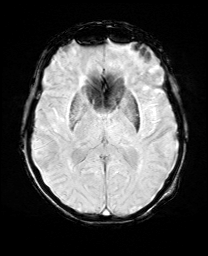
[im 45/60]
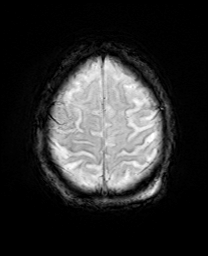
[im 60/60]
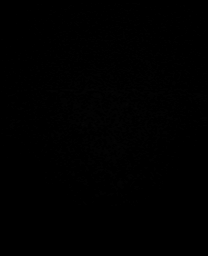

[Series 15: mip_images(sw) · axial · 24.0mm · 0.90mm/px · z∈[-76,+76]mm · 4 of 53 slices shown]
[im 1/53]
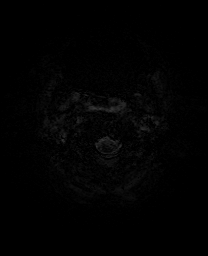
[im 18/53]
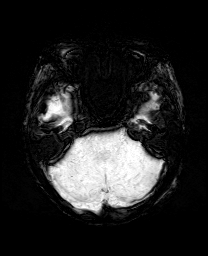
[im 35/53]
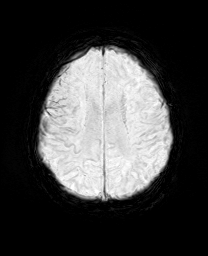
[im 53/53]
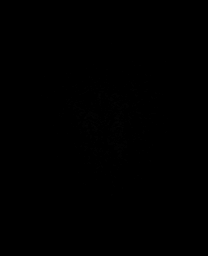

[Series 17: T2 · coronal · 5.0mm · 0.34mm/px · 2 of 29 slices shown (2 of 2)]
[im 1/29]
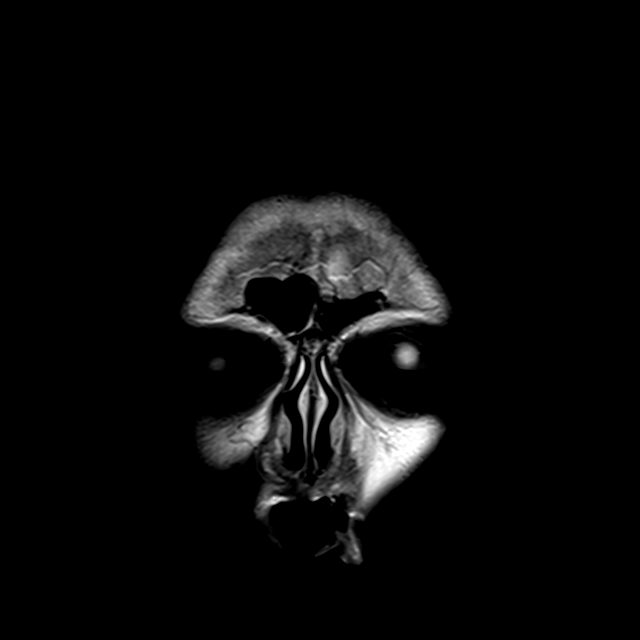
[im 29/29]
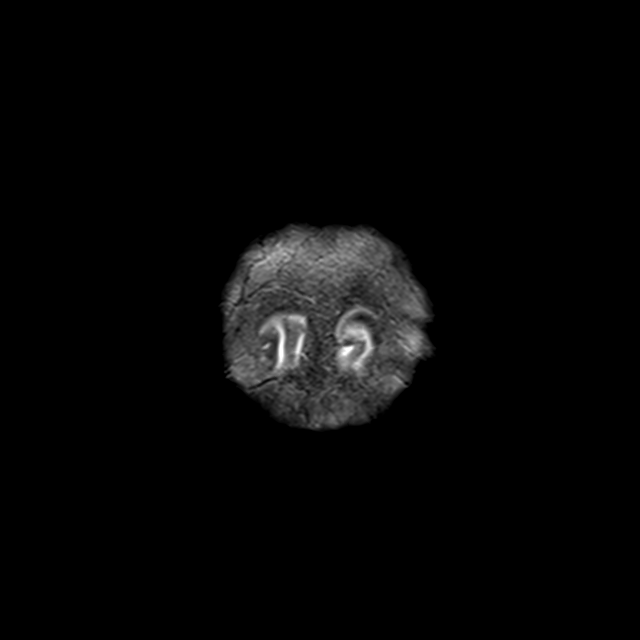

[43 of 48 positions shown; findings below may reference images not displayed]

FINDINGS: BRAIN: Multifocal acute ischemia throughout the right MCA territory,
greatest along the right precentral gyrus and within the inferior
lateral right frontal lobe. Moderate ischemia the right basal
ganglia. Multifocal white matter hyperintensity, most commonly due
to chronic ischemic microangiopathy. The cerebral and cerebellar
volume are age-appropriate. There is no hydrocephalus. The midline
structures are normal. No acute hemorrhage.

VASCULAR: The major intracranial arterial and venous sinus flow
voids are normal. Susceptibility-sensitive sequences show no chronic
microhemorrhage or superficial siderosis.

SKULL AND UPPER CERVICAL SPINE: Calvarial bone marrow signal is
normal. There is no skull base mass. The visualized upper cervical
spine and soft tissues are normal.

SINUSES/ORBITS: There are no fluid levels or advanced mucosal
thickening. The mastoid air cells and middle ear cavities are free
of fluid. The orbits are normal.
IMPRESSION: Multifocal ischemic infarction of the right MCA territory without
acute hemorrhage or mass effect.

## 2020-07-16 ENCOUNTER — Encounter (HOSPITAL_BASED_OUTPATIENT_CLINIC_OR_DEPARTMENT_OTHER): Payer: Self-pay | Admitting: Emergency Medicine

## 2020-07-16 ENCOUNTER — Emergency Department (HOSPITAL_BASED_OUTPATIENT_CLINIC_OR_DEPARTMENT_OTHER): Payer: PPO

## 2020-07-16 ENCOUNTER — Other Ambulatory Visit: Payer: Self-pay

## 2020-07-16 ENCOUNTER — Observation Stay (HOSPITAL_COMMUNITY): Payer: PPO

## 2020-07-16 ENCOUNTER — Inpatient Hospital Stay (HOSPITAL_BASED_OUTPATIENT_CLINIC_OR_DEPARTMENT_OTHER)
Admission: EM | Admit: 2020-07-16 | Discharge: 2020-07-29 | DRG: 086 | Disposition: A | Discharge status: Skilled Nursing Facility | Payer: PPO | Attending: Neurological Surgery | Admitting: Neurological Surgery

## 2020-07-16 DIAGNOSIS — M25512 Pain in left shoulder: Secondary | ICD-10-CM | POA: Diagnosis not present

## 2020-07-16 DIAGNOSIS — S0003XA Contusion of scalp, initial encounter: Secondary | ICD-10-CM | POA: Diagnosis not present

## 2020-07-16 DIAGNOSIS — S199XXA Unspecified injury of neck, initial encounter: Secondary | ICD-10-CM | POA: Diagnosis not present

## 2020-07-16 DIAGNOSIS — S0990XA Unspecified injury of head, initial encounter: Secondary | ICD-10-CM

## 2020-07-16 DIAGNOSIS — I618 Other nontraumatic intracerebral hemorrhage: Secondary | ICD-10-CM | POA: Diagnosis not present

## 2020-07-16 DIAGNOSIS — Z7401 Bed confinement status: Secondary | ICD-10-CM | POA: Diagnosis not present

## 2020-07-16 DIAGNOSIS — S066X9A Traumatic subarachnoid hemorrhage with loss of consciousness of unspecified duration, initial encounter: Secondary | ICD-10-CM | POA: Diagnosis not present

## 2020-07-16 DIAGNOSIS — Z880 Allergy status to penicillin: Secondary | ICD-10-CM | POA: Diagnosis not present

## 2020-07-16 DIAGNOSIS — I619 Nontraumatic intracerebral hemorrhage, unspecified: Secondary | ICD-10-CM | POA: Diagnosis present

## 2020-07-16 DIAGNOSIS — Z881 Allergy status to other antibiotic agents status: Secondary | ICD-10-CM | POA: Diagnosis not present

## 2020-07-16 DIAGNOSIS — Z888 Allergy status to other drugs, medicaments and biological substances status: Secondary | ICD-10-CM | POA: Diagnosis not present

## 2020-07-16 DIAGNOSIS — W06XXXA Fall from bed, initial encounter: Secondary | ICD-10-CM | POA: Diagnosis present

## 2020-07-16 DIAGNOSIS — R1310 Dysphagia, unspecified: Secondary | ICD-10-CM | POA: Diagnosis not present

## 2020-07-16 DIAGNOSIS — E039 Hypothyroidism, unspecified: Secondary | ICD-10-CM | POA: Diagnosis present

## 2020-07-16 DIAGNOSIS — Y92122 Bedroom in nursing home as the place of occurrence of the external cause: Secondary | ICD-10-CM

## 2020-07-16 DIAGNOSIS — I69398 Other sequelae of cerebral infarction: Secondary | ICD-10-CM | POA: Diagnosis not present

## 2020-07-16 DIAGNOSIS — Z79890 Hormone replacement therapy (postmenopausal): Secondary | ICD-10-CM | POA: Diagnosis not present

## 2020-07-16 DIAGNOSIS — S066XAA Traumatic subarachnoid hemorrhage with loss of consciousness status unknown, initial encounter: Secondary | ICD-10-CM | POA: Diagnosis present

## 2020-07-16 DIAGNOSIS — M810 Age-related osteoporosis without current pathological fracture: Secondary | ICD-10-CM | POA: Diagnosis present

## 2020-07-16 DIAGNOSIS — Z79899 Other long term (current) drug therapy: Secondary | ICD-10-CM | POA: Diagnosis not present

## 2020-07-16 DIAGNOSIS — R569 Unspecified convulsions: Secondary | ICD-10-CM | POA: Diagnosis not present

## 2020-07-16 DIAGNOSIS — S50312A Abrasion of left elbow, initial encounter: Secondary | ICD-10-CM | POA: Diagnosis not present

## 2020-07-16 DIAGNOSIS — I10 Essential (primary) hypertension: Secondary | ICD-10-CM | POA: Diagnosis present

## 2020-07-16 DIAGNOSIS — Z789 Other specified health status: Secondary | ICD-10-CM | POA: Diagnosis not present

## 2020-07-16 DIAGNOSIS — Z20822 Contact with and (suspected) exposure to covid-19: Secondary | ICD-10-CM | POA: Diagnosis present

## 2020-07-16 DIAGNOSIS — Z8673 Personal history of transient ischemic attack (TIA), and cerebral infarction without residual deficits: Secondary | ICD-10-CM | POA: Diagnosis not present

## 2020-07-16 DIAGNOSIS — R220 Localized swelling, mass and lump, head: Secondary | ICD-10-CM | POA: Diagnosis not present

## 2020-07-16 DIAGNOSIS — I480 Paroxysmal atrial fibrillation: Secondary | ICD-10-CM | POA: Diagnosis present

## 2020-07-16 DIAGNOSIS — I69391 Dysphagia following cerebral infarction: Secondary | ICD-10-CM

## 2020-07-16 DIAGNOSIS — Z66 Do not resuscitate: Secondary | ICD-10-CM | POA: Diagnosis not present

## 2020-07-16 DIAGNOSIS — S06340A Traumatic hemorrhage of right cerebrum without loss of consciousness, initial encounter: Secondary | ICD-10-CM

## 2020-07-16 DIAGNOSIS — S06369D Traumatic hemorrhage of cerebrum, unspecified, with loss of consciousness of unspecified duration, subsequent encounter: Secondary | ICD-10-CM | POA: Diagnosis not present

## 2020-07-16 DIAGNOSIS — S06360A Traumatic hemorrhage of cerebrum, unspecified, without loss of consciousness, initial encounter: Secondary | ICD-10-CM | POA: Diagnosis not present

## 2020-07-16 DIAGNOSIS — M2550 Pain in unspecified joint: Secondary | ICD-10-CM | POA: Diagnosis not present

## 2020-07-16 DIAGNOSIS — S40212A Abrasion of left shoulder, initial encounter: Secondary | ICD-10-CM | POA: Diagnosis not present

## 2020-07-16 DIAGNOSIS — G8929 Other chronic pain: Secondary | ICD-10-CM | POA: Diagnosis not present

## 2020-07-16 DIAGNOSIS — R58 Hemorrhage, not elsewhere classified: Secondary | ICD-10-CM | POA: Diagnosis not present

## 2020-07-16 DIAGNOSIS — S066X0A Traumatic subarachnoid hemorrhage without loss of consciousness, initial encounter: Secondary | ICD-10-CM | POA: Diagnosis not present

## 2020-07-16 DIAGNOSIS — I69354 Hemiplegia and hemiparesis following cerebral infarction affecting left non-dominant side: Secondary | ICD-10-CM | POA: Diagnosis not present

## 2020-07-16 DIAGNOSIS — Z7409 Other reduced mobility: Secondary | ICD-10-CM | POA: Diagnosis not present

## 2020-07-16 DIAGNOSIS — Z7901 Long term (current) use of anticoagulants: Secondary | ICD-10-CM | POA: Diagnosis not present

## 2020-07-16 DIAGNOSIS — I609 Nontraumatic subarachnoid hemorrhage, unspecified: Secondary | ICD-10-CM | POA: Diagnosis not present

## 2020-07-16 DIAGNOSIS — S065X0A Traumatic subdural hemorrhage without loss of consciousness, initial encounter: Secondary | ICD-10-CM | POA: Diagnosis not present

## 2020-07-16 DIAGNOSIS — M6281 Muscle weakness (generalized): Secondary | ICD-10-CM | POA: Diagnosis not present

## 2020-07-16 DIAGNOSIS — W19XXXA Unspecified fall, initial encounter: Secondary | ICD-10-CM

## 2020-07-16 DIAGNOSIS — S066X9D Traumatic subarachnoid hemorrhage with loss of consciousness of unspecified duration, subsequent encounter: Secondary | ICD-10-CM | POA: Diagnosis not present

## 2020-07-16 DIAGNOSIS — G44319 Acute post-traumatic headache, not intractable: Secondary | ICD-10-CM | POA: Diagnosis not present

## 2020-07-16 DIAGNOSIS — Z91018 Allergy to other foods: Secondary | ICD-10-CM | POA: Diagnosis not present

## 2020-07-16 DIAGNOSIS — K219 Gastro-esophageal reflux disease without esophagitis: Secondary | ICD-10-CM | POA: Diagnosis not present

## 2020-07-16 DIAGNOSIS — R0902 Hypoxemia: Secondary | ICD-10-CM | POA: Diagnosis not present

## 2020-07-16 DIAGNOSIS — Z9181 History of falling: Secondary | ICD-10-CM | POA: Diagnosis not present

## 2020-07-16 DIAGNOSIS — M47812 Spondylosis without myelopathy or radiculopathy, cervical region: Secondary | ICD-10-CM | POA: Diagnosis not present

## 2020-07-16 HISTORY — DX: Supraventricular tachycardia, unspecified: I47.10

## 2020-07-16 HISTORY — DX: Age-related osteoporosis without current pathological fracture: M81.0

## 2020-07-16 HISTORY — DX: Cerebral infarction, unspecified: I63.9

## 2020-07-16 HISTORY — DX: Dysphagia, unspecified: R13.10

## 2020-07-16 HISTORY — DX: Supraventricular tachycardia: I47.1

## 2020-07-16 HISTORY — DX: Essential (primary) hypertension: I10

## 2020-07-16 HISTORY — DX: Unspecified atrial fibrillation: I48.91

## 2020-07-16 LAB — COMPREHENSIVE METABOLIC PANEL
ALT: 18 U/L (ref 0–44)
AST: 28 U/L (ref 15–41)
Albumin: 3.6 g/dL (ref 3.5–5.0)
Alkaline Phosphatase: 64 U/L (ref 38–126)
Anion gap: 11 (ref 5–15)
BUN: 20 mg/dL (ref 8–23)
CO2: 28 mmol/L (ref 22–32)
Calcium: 9 mg/dL (ref 8.9–10.3)
Chloride: 98 mmol/L (ref 98–111)
Creatinine, Ser: 1.03 mg/dL — ABNORMAL HIGH (ref 0.44–1.00)
GFR, Estimated: 53 mL/min — ABNORMAL LOW (ref >60)
Glucose, Bld: 95 mg/dL (ref 70–99)
Potassium: 3.8 mmol/L (ref 3.5–5.1)
Sodium: 137 mmol/L (ref 135–145)
Total Bilirubin: 0.5 mg/dL (ref 0.3–1.2)
Total Protein: 6.3 g/dL — ABNORMAL LOW (ref 6.5–8.1)

## 2020-07-16 LAB — CBC WITH DIFFERENTIAL/PLATELET
Abs Immature Granulocytes: 0.12 10*3/uL — ABNORMAL HIGH (ref 0.00–0.07)
Basophils Absolute: 0 10*3/uL (ref 0.0–0.1)
Basophils Relative: 0 %
Eosinophils Absolute: 0.1 10*3/uL (ref 0.0–0.5)
Eosinophils Relative: 1 %
HCT: 41 % (ref 36.0–46.0)
Hemoglobin: 13.4 g/dL (ref 12.0–15.0)
Immature Granulocytes: 1 %
Lymphocytes Relative: 18 %
Lymphs Abs: 2.6 10*3/uL (ref 0.7–4.0)
MCH: 30.4 pg (ref 26.0–34.0)
MCHC: 32.7 g/dL (ref 30.0–36.0)
MCV: 93 fL (ref 80.0–100.0)
Monocytes Absolute: 0.9 10*3/uL (ref 0.1–1.0)
Monocytes Relative: 7 %
Neutro Abs: 10.2 10*3/uL — ABNORMAL HIGH (ref 1.7–7.7)
Neutrophils Relative %: 73 %
Platelets: 214 10*3/uL (ref 150–400)
RBC: 4.41 MIL/uL (ref 3.87–5.11)
RDW: 13.2 % (ref 11.5–15.5)
WBC: 13.9 10*3/uL — ABNORMAL HIGH (ref 4.0–10.5)
nRBC: 0 % (ref 0.0–0.2)

## 2020-07-16 LAB — PROTIME-INR
INR: 1.1 (ref 0.8–1.2)
Prothrombin Time: 13.6 seconds (ref 11.4–15.2)

## 2020-07-16 LAB — RESP PANEL BY RT-PCR (FLU A&B, COVID) ARPGX2
Influenza A by PCR: NEGATIVE
Influenza B by PCR: NEGATIVE
SARS Coronavirus 2 by RT PCR: NEGATIVE

## 2020-07-16 MED ORDER — METOPROLOL TARTRATE 25 MG PO TABS
25.0000 mg | ORAL_TABLET | Freq: Once | ORAL | Status: AC
  Administered 2020-07-16: 25 mg via ORAL
  Filled 2020-07-16: qty 1

## 2020-07-16 MED ORDER — LOSARTAN POTASSIUM 50 MG PO TABS
25.0000 mg | ORAL_TABLET | Freq: Every day | ORAL | Status: DC
  Administered 2020-07-16 – 2020-07-18 (×3): 25 mg via ORAL
  Filled 2020-07-16 (×3): qty 1

## 2020-07-16 MED ORDER — SODIUM CHLORIDE 0.9% FLUSH
3.0000 mL | Freq: Two times a day (BID) | INTRAVENOUS | Status: DC
  Administered 2020-07-16 – 2020-07-27 (×23): 3 mL via INTRAVENOUS

## 2020-07-16 MED ORDER — ACETAMINOPHEN 325 MG PO TABS
650.0000 mg | ORAL_TABLET | Freq: Four times a day (QID) | ORAL | Status: DC | PRN
  Administered 2020-07-16 – 2020-07-28 (×15): 650 mg via ORAL
  Filled 2020-07-16 (×15): qty 2

## 2020-07-16 MED ORDER — PROTHROMBIN COMPLEX CONC HUMAN 500 UNITS IV KIT
2622.0000 [IU] | PACK | Status: AC
  Administered 2020-07-16: 2622 [IU] via INTRAVENOUS
  Filled 2020-07-16: qty 622

## 2020-07-16 MED ORDER — SODIUM CHLORIDE 0.9 % IV SOLN: 250.0000 mL | INTRAVENOUS | Status: DC | PRN

## 2020-07-16 MED ORDER — LEVETIRACETAM 500 MG PO TABS
500.0000 mg | ORAL_TABLET | Freq: Once | ORAL | Status: AC
  Administered 2020-07-16: 500 mg via ORAL

## 2020-07-16 MED ORDER — HYDRALAZINE HCL 20 MG/ML IJ SOLN
5.0000 mg | INTRAMUSCULAR | Status: DC | PRN
  Administered 2020-07-17 – 2020-07-20 (×4): 5 mg via INTRAVENOUS
  Filled 2020-07-16 (×4): qty 1

## 2020-07-16 MED ORDER — LEVETIRACETAM 500 MG PO TABS
500.0000 mg | ORAL_TABLET | Freq: Two times a day (BID) | ORAL | Status: DC
  Administered 2020-07-16 – 2020-07-28 (×24): 500 mg via ORAL
  Filled 2020-07-16 (×26): qty 1

## 2020-07-16 MED ORDER — SODIUM CHLORIDE 0.9% FLUSH: 3.0000 mL | INTRAVENOUS | Status: DC | PRN

## 2020-07-16 MED ORDER — LEVETIRACETAM 500 MG PO TABS: 500.0000 mg | ORAL_TABLET | Freq: Once | ORAL | Status: DC

## 2020-07-16 MED ORDER — ACETAMINOPHEN 650 MG RE SUPP: 650.0000 mg | Freq: Four times a day (QID) | RECTAL | Status: DC | PRN

## 2020-07-16 MED ORDER — METHOCARBAMOL 1000 MG/10ML IJ SOLN
500.0000 mg | Freq: Four times a day (QID) | INTRAVENOUS | Status: DC | PRN
  Filled 2020-07-16: qty 5

## 2020-07-16 MED ORDER — METOPROLOL TARTRATE 50 MG PO TABS
50.0000 mg | ORAL_TABLET | Freq: Every day | ORAL | Status: DC
Start: 2020-07-16 — End: 2020-07-17
  Administered 2020-07-16 – 2020-07-17 (×2): 50 mg via ORAL
  Filled 2020-07-16: qty 1
  Filled 2020-07-16: qty 2

## 2020-07-16 MED ORDER — ACETAMINOPHEN 325 MG PO TABS
650.0000 mg | ORAL_TABLET | Freq: Once | ORAL | Status: AC
  Administered 2020-07-16: 650 mg via ORAL
  Filled 2020-07-16: qty 2

## 2020-07-16 NOTE — ED Notes (Signed)
Report given to Sierra,RN

## 2020-07-16 NOTE — ED Notes (Signed)
Pt has weakness to left side due to a previous CVA.

## 2020-07-16 NOTE — ED Triage Notes (Signed)
Fall this morning at 0600, tried to get up on her own. Hx stroke, left arm fx, 24hr home care. Posterior headache

## 2020-07-16 NOTE — ED Notes (Signed)
Pt assisted to room 10 in w/c with daughter. Assisted into bed x 2 staff @. Pt declines ER gown, placed on 5 lead monitor.

## 2020-07-16 NOTE — ED Notes (Signed)
Dr. Jake Samples notified of pt episode of twitching. New orders received for Keppra and to repeat CT scan stat.

## 2020-07-16 NOTE — ED Notes (Signed)
I have jus given report to [place], Charity fundraiser, CN at [facility] [employer] [facility]. Dr. Madilyn Hook has told me she has spoken with Dr. Denton Lank at [facility], who has accepted the pt. I am awaiting the call from [facility]. Pt. Remains awake, alert and in no distress. Her daughter remains with her and is aware of dx and plan to transfer to [facility], with which they agree.

## 2020-07-16 NOTE — Progress Notes (Signed)
   Providing Compassionate, Quality Care - Together  NEUROSURGERY PROGRESS NOTE   S: pt s/e, multiple episodes of twitching and shaking on leftside, keppra given  O: EXAM:  BP 124/67 (BP Location: Right Arm)   Pulse 74   Temp 99.6 F (37.6 C) (Axillary)   Resp 17   SpO2 96%   Awake, alert, oriented x2 Speech fluent, appropriate  CNs grossly intact  Face symmetric Chronic LUE weakness, otherwise full strength   ASSESSMENT:  86 y.o. female with  1. Right IPH  -significant blossom on repeat CT  PLAN: -will order kcentra stat -transfer to ICU for q1h neuro checks -pain control -SBP goal <160 -repeat CT in am -d/w daughter extensively about reversal of eliquis, given tiny hemorrhage before we elected to monitor her closely as history of embolic stroke 1 year ago however now given significant increase of hemorrhage, reversal is ordered -daughter states she is POA and patient is DNR -d/w daughter that if this continues to expand, the patient is not a surgical candidate -will continue to monitor closely    Thank you for allowing me to participate in this patient's care.  Please do not hesitate to call with questions or concerns.   Monia Pouch, DO Neurosurgeon [facility] Cell: [phone]

## 2020-07-16 NOTE — ED Notes (Signed)
Ems here to drop pt off. Pt a&OX3 her for neuro consult. Per EMS pt had a fall at home hit back of head. History of left side weakness from previous stroke.

## 2020-07-16 NOTE — ED Provider Notes (Addendum)
[facility] EMERGENCY DEPARTMENT Provider Note   CSN: [id number] Arrival date & time: 07/16/20  0102     History Chief Complaint  Patient presents with  . Fall    Nancy Zamora is a 86 y.o. female.  The history is provided by the patient, a relative and medical records.  Fall   Nancy Zamora is a 86 y.o. female who presents to the Emergency Department complaining of fall.  Level V caveat due to confusion. History is provided by the patient and her daughter. She presents to the ED for evaluation of injuries following a fall out of bed at 6am.  She tried to get out of bed by herself (normally needs assistance with ambulation).  She got up because she needed to go to the bathroom.  No LOC.  Has headache and mild neck pain. Takes eliquis for hx/o stroke.  No recent illnesses.  Normally walks with a walker and assistance.  She has a hx/o CVA and has left sided deficits at baseline.      History reviewed. No pertinent past medical history.  Patient Active Problem List   Diagnosis Date Noted  . Intraparenchymal hemorrhage of brain (HCC) 07/16/2020  . Atrial fibrillation with RVR (HCC) 03/31/2019  . SVT (supraventricular tachycardia) (HCC) 03/31/2019  . Essential hypertension 03/31/2019  . Hyperlipidemia 03/31/2019  . Dysphagia due to recent cerebral infarction 03/31/2019  . Fall 03/31/2019  . Hypothyroidism 03/31/2019  . Osteoporosis 03/31/2019  . Malnutrition of moderate degree 03/29/2019  . Airway intubation performed without difficulty   . Acute embolic stroke (HCC) 03/26/2019  . Middle cerebral artery embolism, right 03/26/2019    Past Surgical History:  Procedure Laterality Date  . IR ANGIO INTRA EXTRACRAN SEL COM CAROTID INNOMINATE UNI R MOD SED  03/26/2019  . IR ANGIO VERTEBRAL SEL SUBCLAVIAN INNOMINATE UNI R MOD SED  03/26/2019  . RADIOLOGY WITH ANESTHESIA Left 03/26/2019   Procedure: RADIOLOGY WITH ANESTHESIA;  Surgeon: Julieanne Cotton, MD;  Location: [facility]  OR;  Service: Radiology;  Laterality: Left;     OB History   No obstetric history on file.     History reviewed. No pertinent family history.  Social History   Tobacco Use  . Smoking status: Never Smoker  . Smokeless tobacco: Never Used    Home Medications Prior to Admission medications   Medication Sig Start Date End Date Taking? Authorizing Provider  acyclovir (ZOVIRAX) 800 MG tablet Take 800 mg by mouth 3 (three) times daily as needed.    [provider]  apixaban (ELIQUIS) 2.5 MG TABS tablet Take 1 tablet (2.5 mg total) by mouth 2 (two) times daily. 03/31/19   Layne Benton, NP  atorvastatin (LIPITOR) 20 MG tablet Take 1 tablet (20 mg total) by mouth daily at 6 PM. 03/31/19   Layne Benton, NP  calcium-vitamin D (OSCAL WITH D) 500-200 MG-UNIT TABS tablet Take by mouth.    [provider]  Carboxymethylcell-Hypromellose 0.25-0.3 % GEL Place 1 drop into both eyes daily as needed (dry eyes).    [provider]  flecainide (TAMBOCOR) 50 MG tablet SMARTSIG:1 Tablet(s) By Mouth Every 12 Hours 09/26/19   [provider]  hydrocortisone (ANUSOL-HC) 25 MG suppository Place 25 mg rectally 2 (two) times daily. PRN    [provider]  ipratropium (ATROVENT) 0.06 % nasal spray Place 2 sprays into both nostrils 4 (four) times daily as needed for rhinitis.    [provider]  levothyroxine (SYNTHROID) 112 MCG tablet Take  1 tablet (112 mcg total) by mouth daily. 03/31/19   Donzetta Starch, NP  metoprolol tartrate (LOPRESSOR) 50 MG tablet Take 25 mg by mouth 2 (two) times daily. 09/26/19   [provider]  mometasone (NASONEX) 50 MCG/ACT nasal spray Place 2 sprays into the nose daily.    [provider]  Multiple Vitamin (MULTIVITAMIN) capsule Take 1 capsule by mouth daily.    [provider]  omeprazole (PRILOSEC) 40 MG capsule Take 40 mg by mouth 2 (two) times daily. 03/10/19   [provider]  phenazopyridine  (PYRIDIUM) 95 MG tablet Take 95 mg by mouth 3 (three) times daily as needed for pain. PRN    [provider]  polyethylene glycol (MIRALAX / GLYCOLAX) 17 g packet Take 17 g by mouth daily as needed for constipation.    [provider]  traMADol (ULTRAM) 50 MG tablet Take 50 mg by mouth 2 (two) times daily as needed for pain. 12/17/18   [provider]  triamcinolone cream (KENALOG) 0.1 % Apply 1 application topically 2 (two) times daily.    [provider]  vitamin C (ASCORBIC ACID) 500 MG tablet Take 500 mg by mouth daily.    [provider]    Allergies    Chocolate, Gabapentin, Penicillins, Tetracyclines & related, and Tape  Review of Systems   Review of Systems  All other systems reviewed and are negative.   Physical Exam Updated Vital Signs BP (!) 154/92 (BP Location: Right Arm)   Pulse (!) 59   Temp 97.9 F (36.6 C)   Resp 14   SpO2 95%   Physical Exam Vitals and nursing note reviewed.  Constitutional:      Appearance: She is well-developed and well-nourished.  HENT:     Head: Normocephalic.     Comments: Swelling and tenderness to occipital scalp Neck:     Comments: No midline cervical spine tenderness Cardiovascular:     Rate and Rhythm: Normal rate and regular rhythm.     Heart sounds: No murmur heard.   Pulmonary:     Effort: Pulmonary effort is normal. No respiratory distress.     Breath sounds: Normal breath sounds.  Chest:     Chest wall: No tenderness.  Abdominal:     Palpations: Abdomen is soft.     Tenderness: There is no abdominal tenderness. There is no guarding or rebound.  Musculoskeletal:        General: No edema.     Comments: Skin tear to left elbow without focal tenderness.  TTP over the left shoulder with decreased ROM (chronic per pt and daughter.   Skin:    General: Skin is warm and dry.  Neurological:     Mental Status: She is alert and oriented to person, place, and time.     Comments:  Oriented to person, place and recent events.  4/5 strength in LUE/LLE (baseline per patient and daughter).   Psychiatric:        Mood and Affect: Mood and affect normal.        Behavior: Behavior normal.     ED Results / Procedures / Treatments   Labs (all labs ordered are listed, but only abnormal results are displayed) Labs Reviewed  RESP PANEL BY RT-PCR (FLU A&B, COVID) [id number]  COMPREHENSIVE METABOLIC PANEL  CBC WITH DIFFERENTIAL/PLATELET  PROTIME-INR    EKG None  Radiology DG Elbow Complete Left  Result Date: 07/16/2020 CLINICAL DATA:  Fall this morning. History of a  left shoulder fracture. Elbow abrasion. EXAM: LEFT ELBOW - COMPLETE 3+ VIEW COMPARISON:  03/30/2019 FINDINGS: No fracture.  No bone lesion. Elbow joint normally spaced and aligned. No significant arthropathic change. No joint effusion. Soft tissues are unremarkable. IMPRESSION: Negative. Electronically Signed   By: Lajean Manes M.D.   On: 07/16/2020 08:39   CT Head Wo Contrast  Result Date: 07/16/2020 CLINICAL DATA:  Status post fall this morning, hit the back the head. Patient is on Eloquist. History of stroke. EXAM: CT HEAD WITHOUT CONTRAST CT CERVICAL SPINE WITHOUT CONTRAST TECHNIQUE: Multidetector CT imaging of the head and cervical spine was performed following the standard protocol without intravenous contrast. Multiplanar CT image reconstructions of the cervical spine were also generated. COMPARISON:  July 24, 2019 FINDINGS: CT HEAD FINDINGS Brain: There is acute extra-axial hemorrhage and acute intraparenchymal hemorrhage at the right inferior frontal lobe. The extra-axial component measures 7 mm. There is no midline shift or hydrocephalus. There is moderate-sized old right MCA infarct involving the basal ganglia, insula and frontal lobe. Vascular: No hyperdense vessel or unexpected calcification. Skull: Normal. Negative for fracture or focal lesion. Sinuses/Orbits: No acute finding. Other: Posterior occipital  extracranial soft tissue swelling and hematoma is identified. CT CERVICAL SPINE FINDINGS Alignment: There is straightening of the cervical spine. Skull base and vertebrae: No acute fracture. No primary bone lesion or focal pathologic process. Soft tissues and spinal canal: No prevertebral fluid or swelling. No visible canal hematoma. Disc levels: Degenerative joint changes are identified throughout the cervical spine with narrowed joint space and osteophyte formation most prominently involving the C5-6 and C6-7 levels. Upper chest: Negative. Other: None. IMPRESSION: 1. Acute extra-axial hemorrhage and acute intraparenchymal hemorrhage at the right inferior frontal lobe. The extra-axial component measures 7 mm. 2. No acute fracture or dislocation of cervical spine. 3. Degenerative joint changes of the cervical spine. These results were called by telephone at the time of interpretation on 07/16/2020 at 8:43 am to provider [facility] , who verbally acknowledged these results. Electronically Signed   By: Abelardo Diesel M.D.   On: 07/16/2020 08:46   CT Cervical Spine Wo Contrast  Result Date: 07/16/2020 CLINICAL DATA:  Status post fall this morning, hit the back the head. Patient is on Eloquist. History of stroke. EXAM: CT HEAD WITHOUT CONTRAST CT CERVICAL SPINE WITHOUT CONTRAST TECHNIQUE: Multidetector CT imaging of the head and cervical spine was performed following the standard protocol without intravenous contrast. Multiplanar CT image reconstructions of the cervical spine were also generated. COMPARISON:  July 24, 2019 FINDINGS: CT HEAD FINDINGS Brain: There is acute extra-axial hemorrhage and acute intraparenchymal hemorrhage at the right inferior frontal lobe. The extra-axial component measures 7 mm. There is no midline shift or hydrocephalus. There is moderate-sized old right MCA infarct involving the basal ganglia, insula and frontal lobe. Vascular: No hyperdense vessel or unexpected calcification.  Skull: Normal. Negative for fracture or focal lesion. Sinuses/Orbits: No acute finding. Other: Posterior occipital extracranial soft tissue swelling and hematoma is identified. CT CERVICAL SPINE FINDINGS Alignment: There is straightening of the cervical spine. Skull base and vertebrae: No acute fracture. No primary bone lesion or focal pathologic process. Soft tissues and spinal canal: No prevertebral fluid or swelling. No visible canal hematoma. Disc levels: Degenerative joint changes are identified throughout the cervical spine with narrowed joint space and osteophyte formation most prominently involving the C5-6 and C6-7 levels. Upper chest: Negative. Other: None. IMPRESSION: 1. Acute extra-axial hemorrhage and acute intraparenchymal hemorrhage at the right inferior frontal  lobe. The extra-axial component measures 7 mm. 2. No acute fracture or dislocation of cervical spine. 3. Degenerative joint changes of the cervical spine. These results were called by telephone at the time of interpretation on 07/16/2020 at 8:43 am to provider [facility] , who verbally acknowledged these results. Electronically Signed   By: Abelardo Diesel M.D.   On: 07/16/2020 08:46   DG Shoulder Left  Result Date: 07/16/2020 CLINICAL DATA:  Fall this morning. History of a left shoulder fracture. Elbow abrasion. EXAM: LEFT SHOULDER - 2+ VIEW COMPARISON:  03/30/2019 FINDINGS: No acute fracture.  No dislocation. Marked narrowing of the glenohumeral joint. Mild subchondral sclerosis along the glenoid with remottling/widening of the glenoid and mild flattening of the humeral head articular surface. There is also narrowing of the distal clavicle at the [facility] joint. AC joint normally aligned. Skeletal structures are diffusely demineralized. IMPRESSION: 1. No acute fracture or dislocation. 2. Advanced glenohumeral joint arthropathic changes chronic degenerative/arthropathic changes of the AC joint. Electronically Signed   By: Lajean Manes M.D.    On: 07/16/2020 08:38    Procedures Procedures (including critical care time) CRITICAL CARE Performed by: Quintella Reichert   Total critical care time: 35 minutes  Critical care time was exclusive of separately billable procedures and treating other patients.  Critical care was necessary to treat or prevent imminent or life-threatening deterioration.  Critical care was time spent personally by me on the following activities: development of treatment plan with patient and/or surrogate as well as nursing, discussions with consultants, evaluation of patient's response to treatment, examination of patient, obtaining history from patient or surrogate, ordering and performing treatments and interventions, ordering and review of laboratory studies, ordering and review of radiographic studies, pulse oximetry and re-evaluation of patient's condition.  Medications Ordered in ED Medications  acetaminophen (TYLENOL) tablet 650 mg (650 mg Oral Given 07/16/20 0744)  metoprolol tartrate (LOPRESSOR) tablet 25 mg (25 mg Oral Given 07/16/20 0744)    ED Course  I have reviewed the triage vital signs and the nursing notes.  Pertinent labs & imaging results that were available during my care of the patient were reviewed by me and considered in my medical decision making (see chart for details).    MDM Rules/Calculators/A&P                          Pt here for evaluation following a fall. Last dose of Eliquis was last night. CT head with intraparenchymal hemorrhage. She is at her neurologic baseline on ED assessment. Discussed with Dr. Reatha Armour with neurosurgery. He does not recommend reversal of her Eliquis at this time. He recommends admission for observation and repeat head CT.    Given potential delay in obtaining bed plan to transfer ED to ED so patient can be assessed by neurosurgery directly. Discussed with Dr. Ashok Cordia, in the [facility] ED. He agrees with transfer. Patient and daughter updated findings of  studies recommendation for admission and they are in agreement with plan.  Final Clinical Impression(s) / ED Diagnoses Final diagnoses:  Fall, initial encounter  Injury of head, initial encounter  Traumatic hemorrhage of right cerebrum without loss of consciousness, initial encounter [facility])    Rx / DC Orders ED Discharge Orders    None       Quintella Reichert, MD 07/16/20 1311    Quintella Reichert, MD 07/16/20 1454

## 2020-07-16 NOTE — ED Notes (Signed)
Dinner Tray Ordered @ 1704. 

## 2020-07-16 NOTE — ED Notes (Signed)
At this time, [facility] loads her onto their stretcher without incident. She remains awake, alert and in no distress. Her daughter remains with her and is given instructions from [facility] r/t [facility].

## 2020-07-16 NOTE — H&P (Signed)
Providing Compassionate, Quality Care - Together  NEUROSURGERY HISTORY & PHYSICAL   Nancy Zamora is an 86 y.o. female.   Chief Complaint: Fall out of bed HPI: This is a 86 year old female with a history of embolic stroke, A. fib, PVCs, frail-appearing, that is not supposed to get out of her bed without assistance and needed to go to the restroom.  When she got out of bed without assistance she fell and hit her head on her nightstand.  Denies LOC.  Denies seizure-like activity.  Denies any focal weakness, numbness or tingling.  Does have a history of a embolic stroke last year leaving her with residual left-sided weakness.  Currently is on Eliquis for paroxysmal A. fib and embolic stroke.  She complains of minor headache at this time.  Her daughter is at bedside   Past Surgical History:  Procedure Laterality Date  . IR ANGIO INTRA EXTRACRAN SEL COM CAROTID INNOMINATE UNI R MOD SED  03/26/2019  . IR ANGIO VERTEBRAL SEL SUBCLAVIAN INNOMINATE UNI R MOD SED  03/26/2019  . RADIOLOGY WITH ANESTHESIA Left 03/26/2019   Procedure: RADIOLOGY WITH ANESTHESIA;  Surgeon: Luanne Bras, MD;  Location: [facility];  Service: Radiology;  Laterality: Left;    History reviewed. No pertinent family history. Social History:  reports that she has never smoked. She has never used smokeless tobacco. No history on file for alcohol use and drug use.  Allergies:  Allergies  Allergen Reactions  . Penicillins Hives  . Tetracyclines & Related Hives  . Chocolate Other (See Comments)    Mouth ulcers  . Gabapentin Nausea And Vomiting  . Tape Rash    Tears skin    (Not in a hospital admission)   Results for orders placed or performed during the hospital encounter of 07/16/20 (from the past 48 hour(s))  Comprehensive metabolic panel     Status: Abnormal   Collection Time: 07/16/20  9:22 AM  Result Value Ref Range   Sodium 137 135 - 145 mmol/L   Potassium 3.8 3.5 - 5.1 mmol/L   Chloride 98 98 - 111 mmol/L    CO2 28 22 - 32 mmol/L   Glucose, Bld 95 70 - 99 mg/dL    Comment: Glucose reference range applies only to samples taken after fasting for at least 8 hours.   BUN 20 8 - 23 mg/dL   Creatinine, Ser 1.03 (H) 0.44 - 1.00 mg/dL   Calcium 9.0 8.9 - 10.3 mg/dL   Total Protein 6.3 (L) 6.5 - 8.1 g/dL   Albumin 3.6 3.5 - 5.0 g/dL   AST 28 15 - 41 U/L   ALT 18 0 - 44 U/L   Alkaline Phosphatase 64 38 - 126 U/L   Total Bilirubin 0.5 0.3 - 1.2 mg/dL   GFR, Estimated 53 (L) >60 mL/min    Comment: (NOTE) Calculated using the CKD-EPI Creatinine Equation (2021)    Anion gap 11 5 - 15    Comment: Performed at [facility], [address] [place], [place] [address]  CBC with Differential     Status: Abnormal   Collection Time: 07/16/20  9:22 AM  Result Value Ref Range   WBC 13.9 (H) 4.0 - 10.5 K/uL   RBC 4.41 3.87 - 5.11 MIL/uL   Hemoglobin 13.4 12.0 - 15.0 g/dL   HCT 41.0 36.0 - 46.0 %   MCV 93.0 80.0 - 100.0 fL   MCH 30.4 26.0 - 34.0 pg   MCHC 32.7 30.0 -  36.0 g/dL   RDW 13.2 11.5 - 15.5 %   Platelets 214 150 - 400 K/uL   nRBC 0.0 0.0 - 0.2 %   Neutrophils Relative % 73 %   Neutro Abs 10.2 (H) 1.7 - 7.7 K/uL   Lymphocytes Relative 18 %   Lymphs Abs 2.6 0.7 - 4.0 K/uL   Monocytes Relative 7 %   Monocytes Absolute 0.9 0.1 - 1.0 K/uL   Eosinophils Relative 1 %   Eosinophils Absolute 0.1 0.0 - 0.5 K/uL   Basophils Relative 0 %   Basophils Absolute 0.0 0.0 - 0.1 K/uL   Immature Granulocytes 1 %   Abs Immature Granulocytes 0.12 (H) 0.00 - 0.07 K/uL    Comment: Performed at [facility], [address] [place], [place] [address]  Protime-INR     Status: None   Collection Time: 07/16/20  9:22 AM  Result Value Ref Range   Prothrombin Time 13.6 11.4 - 15.2 seconds   INR 1.1 0.8 - 1.2    Comment: (NOTE) INR goal varies based on device and disease states. Performed at [facility], [address] [place], [place] [address]   Resp Panel by RT-PCR (Flu A&B,  Covid) Nasopharyngeal Swab     Status: None   Collection Time: 07/16/20  9:23 AM   Specimen: Nasopharyngeal Swab; Nasopharyngeal(NP) swabs in vial transport medium  Result Value Ref Range   SARS Coronavirus 2 by RT PCR NEGATIVE NEGATIVE    Comment: (NOTE) SARS-CoV-2 target nucleic acids are NOT DETECTED.  The SARS-CoV-2 RNA is generally detectable in upper respiratory specimens during the acute phase of infection. The lowest concentration of SARS-CoV-2 viral copies this assay can detect is 138 copies/mL. A negative result does not preclude SARS-Cov-2 infection and should not be used as the sole basis for treatment or other patient management decisions. A negative result may occur with  improper specimen collection/handling, submission of specimen other than nasopharyngeal swab, presence of viral mutation(s) within the areas targeted by this assay, and inadequate number of viral copies(<138 copies/mL). A negative result must be combined with clinical observations, patient history, and epidemiological information. The expected result is Negative.  Fact Sheet for Patients:  EntrepreneurPulse.com.au  Fact Sheet for Healthcare Providers:  IncredibleEmployment.be  This test is no t yet approved or cleared by the [place] FDA and  has been authorized for detection and/or diagnosis of SARS-CoV-2 by FDA under an Emergency Use Authorization (EUA). This EUA will remain  in effect (meaning this test can be used) for the duration of the COVID-19 declaration under Section 564(b)(1) of the Act, 21 U.S.C.section 360bbb-3(b)(1), unless the authorization is terminated  or revoked sooner.       Influenza A by PCR NEGATIVE NEGATIVE   Influenza B by PCR NEGATIVE NEGATIVE    Comment: (NOTE) The Xpert Xpress SARS-CoV-2/FLU/RSV plus assay is intended as an aid in the diagnosis of influenza from Nasopharyngeal swab specimens and should not be used as a sole  basis for treatment. Nasal washings and aspirates are unacceptable for Xpert Xpress SARS-CoV-2/FLU/RSV testing.  Fact Sheet for Patients: EntrepreneurPulse.com.au  Fact Sheet for Healthcare Providers: IncredibleEmployment.be  This test is not yet approved or cleared by the [place] FDA and has been authorized for detection and/or diagnosis of SARS-CoV-2 by FDA under an Emergency Use Authorization (EUA). This EUA will remain in effect (meaning this test can be used) for the duration of the COVID-19 declaration under Section 564(b)(1) of the  Act, 21 U.S.C. section 360bbb-3(b)(1), unless the authorization is terminated or revoked.  Performed at [facility], [address] [place], [place] [address]    DG Elbow Complete Left  Result Date: 07/16/2020 CLINICAL DATA:  Fall this morning. History of a left shoulder fracture. Elbow abrasion. EXAM: LEFT ELBOW - COMPLETE 3+ VIEW COMPARISON:  03/30/2019 FINDINGS: No fracture.  No bone lesion. Elbow joint normally spaced and aligned. No significant arthropathic change. No joint effusion. Soft tissues are unremarkable. IMPRESSION: Negative. Electronically Signed   By: Amie Portland M.D.   On: 07/16/2020 08:39   CT Head Wo Contrast  Result Date: 07/16/2020 CLINICAL DATA:  Status post fall this morning, hit the back the head. Patient is on Eloquist. History of stroke. EXAM: CT HEAD WITHOUT CONTRAST CT CERVICAL SPINE WITHOUT CONTRAST TECHNIQUE: Multidetector CT imaging of the head and cervical spine was performed following the standard protocol without intravenous contrast. Multiplanar CT image reconstructions of the cervical spine were also generated. COMPARISON:  July 24, 2019 FINDINGS: CT HEAD FINDINGS Brain: There is acute extra-axial hemorrhage and acute intraparenchymal hemorrhage at the right inferior frontal lobe. The extra-axial component measures 7 mm. There is no midline shift or  hydrocephalus. There is moderate-sized old right MCA infarct involving the basal ganglia, insula and frontal lobe. Vascular: No hyperdense vessel or unexpected calcification. Skull: Normal. Negative for fracture or focal lesion. Sinuses/Orbits: No acute finding. Other: Posterior occipital extracranial soft tissue swelling and hematoma is identified. CT CERVICAL SPINE FINDINGS Alignment: There is straightening of the cervical spine. Skull base and vertebrae: No acute fracture. No primary bone lesion or focal pathologic process. Soft tissues and spinal canal: No prevertebral fluid or swelling. No visible canal hematoma. Disc levels: Degenerative joint changes are identified throughout the cervical spine with narrowed joint space and osteophyte formation most prominently involving the C5-6 and C6-7 levels. Upper chest: Negative. Other: None. IMPRESSION: 1. Acute extra-axial hemorrhage and acute intraparenchymal hemorrhage at the right inferior frontal lobe. The extra-axial component measures 7 mm. 2. No acute fracture or dislocation of cervical spine. 3. Degenerative joint changes of the cervical spine. These results were called by telephone at the time of interpretation on 07/16/2020 at 8:43 am to provider [facility] , who verbally acknowledged these results. Electronically Signed   By: Sherian Rein M.D.   On: 07/16/2020 08:46   CT Cervical Spine Wo Contrast  Result Date: 07/16/2020 CLINICAL DATA:  Status post fall this morning, hit the back the head. Patient is on Eloquist. History of stroke. EXAM: CT HEAD WITHOUT CONTRAST CT CERVICAL SPINE WITHOUT CONTRAST TECHNIQUE: Multidetector CT imaging of the head and cervical spine was performed following the standard protocol without intravenous contrast. Multiplanar CT image reconstructions of the cervical spine were also generated. COMPARISON:  July 24, 2019 FINDINGS: CT HEAD FINDINGS Brain: There is acute extra-axial hemorrhage and acute intraparenchymal  hemorrhage at the right inferior frontal lobe. The extra-axial component measures 7 mm. There is no midline shift or hydrocephalus. There is moderate-sized old right MCA infarct involving the basal ganglia, insula and frontal lobe. Vascular: No hyperdense vessel or unexpected calcification. Skull: Normal. Negative for fracture or focal lesion. Sinuses/Orbits: No acute finding. Other: Posterior occipital extracranial soft tissue swelling and hematoma is identified. CT CERVICAL SPINE FINDINGS Alignment: There is straightening of the cervical spine. Skull base and vertebrae: No acute fracture. No primary bone lesion or focal pathologic process. Soft tissues and spinal canal: No prevertebral fluid or swelling. No visible  canal hematoma. Disc levels: Degenerative joint changes are identified throughout the cervical spine with narrowed joint space and osteophyte formation most prominently involving the C5-6 and C6-7 levels. Upper chest: Negative. Other: None. IMPRESSION: 1. Acute extra-axial hemorrhage and acute intraparenchymal hemorrhage at the right inferior frontal lobe. The extra-axial component measures 7 mm. 2. No acute fracture or dislocation of cervical spine. 3. Degenerative joint changes of the cervical spine. These results were called by telephone at the time of interpretation on 07/16/2020 at 8:43 am to provider [facility] , who verbally acknowledged these results. Electronically Signed   By: Abelardo Diesel M.D.   On: 07/16/2020 08:46   DG Shoulder Left  Result Date: 07/16/2020 CLINICAL DATA:  Fall this morning. History of a left shoulder fracture. Elbow abrasion. EXAM: LEFT SHOULDER - 2+ VIEW COMPARISON:  03/30/2019 FINDINGS: No acute fracture.  No dislocation. Marked narrowing of the glenohumeral joint. Mild subchondral sclerosis along the glenoid with remottling/widening of the glenoid and mild flattening of the humeral head articular surface. There is also narrowing of the distal clavicle at the  [facility] joint. AC joint normally aligned. Skeletal structures are diffusely demineralized. IMPRESSION: 1. No acute fracture or dislocation. 2. Advanced glenohumeral joint arthropathic changes chronic degenerative/arthropathic changes of the AC joint. Electronically Signed   By: Lajean Manes M.D.   On: 07/16/2020 08:38    ROS 14 point review of systems was obtained which all pertinent positives and negatives are listed in HPI above Blood pressure (!) 197/86, pulse 64, temperature 98.1 F (36.7 C), temperature source Oral, resp. rate (!) 22, SpO2 96 %.   Physical exam: Awake alert oriented x2, thought it was 2020 PERRLA EOMI Right eye ecchymoses Left upper extremity chronic weakness otherwise full strength throughout Head is normocephalic, subgaleal hematoma over posterior scalp Sensory intact light touch Follows commands x4 Speech is appropriate and fluent  Assessment/Plan 86 year old female with  1.  Small right frontal traumatic subarachnoid hemorrhage/subdural hematoma  -Admit for observation -We will repeat CT in a.m. -No need for reversal of Eliquis as the traumatic subarachnoid hemorrhage is very small however will need to be off Eliquis until hemorrhage is resolved -Neurochecks -Pain control -Discussed extensively with daughter at bedside and answered all the questions. -No acute neurosurgical intervention at this time    Thank you for allowing me to participate in this patient's care.  Please do not hesitate to call with questions or concerns.   Elwin Sleight, [facility] Cell: [phone]

## 2020-07-16 NOTE — ED Notes (Signed)
Daughter came to nurse's station stating that her mother was having a seizure. Nurse went in to reassess patient and pt was noted to have twitching to left eye. Able to speak in full sentences. Daughter reported that this episode was a lot milder than the last.

## 2020-07-16 NOTE — ED Notes (Signed)
Report given to floor - Hoby, RN. 

## 2020-07-16 NOTE — ED Notes (Signed)
Daughter came out stating her mother was "twitching" upon assessment pt was having some twitching noted to L eye and arm and trouble getting her words out. Lasted about 2 mins and then stopped and pt was able to form full sentences.  MD notified.

## 2020-07-17 ENCOUNTER — Observation Stay (HOSPITAL_COMMUNITY): Payer: PPO

## 2020-07-17 DIAGNOSIS — Z79890 Hormone replacement therapy (postmenopausal): Secondary | ICD-10-CM | POA: Diagnosis not present

## 2020-07-17 DIAGNOSIS — I619 Nontraumatic intracerebral hemorrhage, unspecified: Secondary | ICD-10-CM | POA: Diagnosis not present

## 2020-07-17 DIAGNOSIS — S066X0A Traumatic subarachnoid hemorrhage without loss of consciousness, initial encounter: Secondary | ICD-10-CM | POA: Diagnosis present

## 2020-07-17 DIAGNOSIS — M810 Age-related osteoporosis without current pathological fracture: Secondary | ICD-10-CM | POA: Diagnosis present

## 2020-07-17 DIAGNOSIS — Z880 Allergy status to penicillin: Secondary | ICD-10-CM | POA: Diagnosis not present

## 2020-07-17 DIAGNOSIS — Z20822 Contact with and (suspected) exposure to covid-19: Secondary | ICD-10-CM | POA: Diagnosis present

## 2020-07-17 DIAGNOSIS — I480 Paroxysmal atrial fibrillation: Secondary | ICD-10-CM | POA: Diagnosis present

## 2020-07-17 DIAGNOSIS — S0003XA Contusion of scalp, initial encounter: Secondary | ICD-10-CM | POA: Diagnosis not present

## 2020-07-17 DIAGNOSIS — Y92122 Bedroom in nursing home as the place of occurrence of the external cause: Secondary | ICD-10-CM | POA: Diagnosis not present

## 2020-07-17 DIAGNOSIS — Z91018 Allergy to other foods: Secondary | ICD-10-CM | POA: Diagnosis not present

## 2020-07-17 DIAGNOSIS — E039 Hypothyroidism, unspecified: Secondary | ICD-10-CM | POA: Diagnosis present

## 2020-07-17 DIAGNOSIS — I10 Essential (primary) hypertension: Secondary | ICD-10-CM | POA: Diagnosis present

## 2020-07-17 DIAGNOSIS — Z66 Do not resuscitate: Secondary | ICD-10-CM | POA: Diagnosis present

## 2020-07-17 DIAGNOSIS — Z7901 Long term (current) use of anticoagulants: Secondary | ICD-10-CM | POA: Diagnosis not present

## 2020-07-17 DIAGNOSIS — W06XXXA Fall from bed, initial encounter: Secondary | ICD-10-CM | POA: Diagnosis present

## 2020-07-17 DIAGNOSIS — R1310 Dysphagia, unspecified: Secondary | ICD-10-CM | POA: Diagnosis present

## 2020-07-17 DIAGNOSIS — Z79899 Other long term (current) drug therapy: Secondary | ICD-10-CM | POA: Diagnosis not present

## 2020-07-17 DIAGNOSIS — I609 Nontraumatic subarachnoid hemorrhage, unspecified: Secondary | ICD-10-CM | POA: Diagnosis not present

## 2020-07-17 DIAGNOSIS — Z881 Allergy status to other antibiotic agents status: Secondary | ICD-10-CM | POA: Diagnosis not present

## 2020-07-17 DIAGNOSIS — S065X0A Traumatic subdural hemorrhage without loss of consciousness, initial encounter: Secondary | ICD-10-CM | POA: Diagnosis not present

## 2020-07-17 DIAGNOSIS — I69391 Dysphagia following cerebral infarction: Secondary | ICD-10-CM | POA: Diagnosis not present

## 2020-07-17 DIAGNOSIS — I69354 Hemiplegia and hemiparesis following cerebral infarction affecting left non-dominant side: Secondary | ICD-10-CM | POA: Diagnosis not present

## 2020-07-17 DIAGNOSIS — I618 Other nontraumatic intracerebral hemorrhage: Secondary | ICD-10-CM | POA: Diagnosis not present

## 2020-07-17 DIAGNOSIS — S066X9A Traumatic subarachnoid hemorrhage with loss of consciousness of unspecified duration, initial encounter: Secondary | ICD-10-CM | POA: Diagnosis present

## 2020-07-17 DIAGNOSIS — Z888 Allergy status to other drugs, medicaments and biological substances status: Secondary | ICD-10-CM | POA: Diagnosis not present

## 2020-07-17 LAB — CBC
HCT: 37.4 % (ref 36.0–46.0)
Hemoglobin: 12.4 g/dL (ref 12.0–15.0)
MCH: 30.5 pg (ref 26.0–34.0)
MCHC: 33.2 g/dL (ref 30.0–36.0)
MCV: 91.9 fL (ref 80.0–100.0)
Platelets: 214 10*3/uL (ref 150–400)
RBC: 4.07 MIL/uL (ref 3.87–5.11)
RDW: 13.3 % (ref 11.5–15.5)
WBC: 13.5 10*3/uL — ABNORMAL HIGH (ref 4.0–10.5)
nRBC: 0 % (ref 0.0–0.2)

## 2020-07-17 LAB — BASIC METABOLIC PANEL
Anion gap: 12 (ref 5–15)
BUN: 14 mg/dL (ref 8–23)
CO2: 24 mmol/L (ref 22–32)
Calcium: 9.4 mg/dL (ref 8.9–10.3)
Chloride: 103 mmol/L (ref 98–111)
Creatinine, Ser: 0.78 mg/dL (ref 0.44–1.00)
GFR, Estimated: >60 mL/min (ref >60)
Glucose, Bld: 110 mg/dL — ABNORMAL HIGH (ref 70–99)
Potassium: 4 mmol/L (ref 3.5–5.1)
Sodium: 139 mmol/L (ref 135–145)

## 2020-07-17 LAB — MRSA PCR SCREENING: MRSA by PCR: NEGATIVE

## 2020-07-17 MED ORDER — CHLORHEXIDINE GLUCONATE CLOTH 2 % EX PADS
6.0000 | MEDICATED_PAD | Freq: Every day | CUTANEOUS | Status: DC
  Administered 2020-07-17 – 2020-07-26 (×10): 6 via TOPICAL

## 2020-07-17 MED ORDER — OXYCODONE HCL 5 MG PO TABS
5.0000 mg | ORAL_TABLET | Freq: Four times a day (QID) | ORAL | Status: DC | PRN
  Administered 2020-07-18 – 2020-07-20 (×3): 5 mg via ORAL
  Filled 2020-07-17 (×3): qty 1

## 2020-07-17 MED ORDER — LEVETIRACETAM IN NACL 500 MG/100ML IV SOLN
500.0000 mg | Freq: Once | INTRAVENOUS | Status: AC
  Administered 2020-07-17: 500 mg via INTRAVENOUS
  Filled 2020-07-17: qty 100

## 2020-07-17 MED ORDER — ORAL CARE MOUTH RINSE
15.0000 mL | Freq: Two times a day (BID) | OROMUCOSAL | Status: DC
  Administered 2020-07-17 – 2020-07-27 (×19): 15 mL via OROMUCOSAL

## 2020-07-17 MED ORDER — ONDANSETRON HCL 4 MG/2ML IJ SOLN
4.0000 mg | Freq: Four times a day (QID) | INTRAMUSCULAR | Status: DC | PRN
  Administered 2020-07-18 – 2020-07-20 (×4): 4 mg via INTRAVENOUS
  Filled 2020-07-17 (×4): qty 2

## 2020-07-17 MED ORDER — METOPROLOL TARTRATE 25 MG PO TABS
25.0000 mg | ORAL_TABLET | Freq: Every day | ORAL | Status: DC
  Administered 2020-07-18 – 2020-07-28 (×11): 25 mg via ORAL
  Filled 2020-07-17 (×11): qty 1

## 2020-07-17 NOTE — Progress Notes (Signed)
Patient's SBP's 150-160's this AM. SBP now soft, 90's to low 100's. Patient asymptomatic, sitting up in bedside chair talking with son. Patient received one PRN hydralazine this AM and scheduled PO meds- Losartan 25mg  and Lopressor 50mg .  PMD aware, orders modified.

## 2020-07-17 NOTE — Progress Notes (Signed)
Subjective: Patient reports no acute events overnight.   Objective: Vital signs in last 24 hours: Temp:  [98.1 F (36.7 C)-99.6 F (37.6 C)] 98.3 F (36.8 C) (01/01 0400) Pulse Rate:  [55-74] 57 (01/01 0700) Resp:  [12-22] 16 (01/01 0700) BP: (124-197)/(58-147) 128/58 (01/01 0700) SpO2:  [94 %-99 %] 97 % (01/01 0700) FiO2 (%):  [21 %] 21 % (12/31 1311)  Intake/Output from previous day: 12/31 0701 - 01/01 0700 In: -  Out: 250 [Urine:250] Intake/Output this shift: No intake/output data recorded.  Physical Exam: Patient is awake, A/O x2. Speech is fluent and appropriate. CNs grossly intact. Face is symmetric. Chronic LUE weakness, otherwise full strength.  Lab Results: Recent Labs    07/16/20 0922 07/17/20 0548  WBC 13.9* 13.5*  HGB 13.4 12.4  HCT 41.0 37.4  PLT 214 214   BMET Recent Labs    07/16/20 0922 07/17/20 0548  NA 137 139  K 3.8 4.0  CL 98 103  CO2 28 24  GLUCOSE 95 110*  BUN 20 14  CREATININE 1.03* 0.78  CALCIUM 9.0 9.4    Studies/Results: DG Elbow Complete Left  Result Date: 07/16/2020 CLINICAL DATA:  Fall this morning. History of a left shoulder fracture. Elbow abrasion. EXAM: LEFT ELBOW - COMPLETE 3+ VIEW COMPARISON:  03/30/2019 FINDINGS: No fracture.  No bone lesion. Elbow joint normally spaced and aligned. No significant arthropathic change. No joint effusion. Soft tissues are unremarkable. IMPRESSION: Negative. Electronically Signed   By: Amie Portland M.D.   On: 07/16/2020 08:39   CT HEAD WO CONTRAST  Result Date: 07/17/2020 CLINICAL DATA:  Subarachnoid hemorrhage follow up EXAM: CT HEAD WITHOUT CONTRAST TECHNIQUE: Contiguous axial images were obtained from the base of the skull through the vertex without intravenous contrast. COMPARISON:  None. FINDINGS: Brain: Unchanged intraparenchymal hematoma in the right frontal lobe with small amount of nearby subarachnoid blood. No midline shift or hydrocephalus. Unchanged appearance of right frontal  infarct. Vascular: No abnormal hyperdensity of the major intracranial arteries or dural venous sinuses. No intracranial atherosclerosis. Skull: Posterior midline scalp hematoma. Sinuses/Orbits: No fluid levels or advanced mucosal thickening of the visualized paranasal sinuses. No mastoid or middle ear effusion. The orbits are normal. IMPRESSION: 1. Unchanged right frontal lobe intraparenchymal hematoma with small amount of nearby subarachnoid blood. 2. Posterior midline scalp hematoma without underlying fracture. Electronically Signed   By: Deatra Robinson M.D.   On: 07/17/2020 04:53   CT Head Wo Contrast  Result Date: 07/16/2020 CLINICAL DATA:  Fall.  On Eliquis.  Seizure. EXAM: CT HEAD WITHOUT CONTRAST TECHNIQUE: Contiguous axial images were obtained from the base of the skull through the vertex without intravenous contrast. COMPARISON:  CT head earlier today FINDINGS: Brain: Large right frontal hematoma has enlarged significantly since the prior study. Hematoma now measures 4.9 x 3.8 x 3.8 cm. Hematoma volume approximately 35 mL. Mild amount of adjacent subarachnoid hemorrhage. There is a chronic infarct in the right frontal lobe adjacent to the hematoma. There is a new small area of acute hemorrhage within the chronic infarct. There is mass-effect on the right frontal horn. Mild atrophy without hydrocephalus. Chronic ischemic changes in the white matter. Vascular: Negative for hyperdense vessel Skull: Negative for skull fracture.  Posterior scalp hematoma. Sinuses/Orbits: Paranasal sinuses clear. Bilateral cataract extraction. Other: None IMPRESSION: Large right frontal parenchymal hematoma shows marked progression from earlier today. Hematoma volume 35 mL. Small adjacent subarachnoid hemorrhage. Local mass-effect in the right frontal lobe on the right frontal horn. Chronic infarct  right frontal lobe which shows a small amount of acute hemorrhage, new from earlier today. These results were called by telephone  at the time of interpretation on 07/16/2020 at 9:38 pm to provider TROY DAWLEY , who verbally acknowledged these results. Electronically Signed   By: Franchot Gallo M.D.   On: 07/16/2020 21:38   CT Head Wo Contrast  Result Date: 07/16/2020 CLINICAL DATA:  Status post fall this morning, hit the back the head. Patient is on Eloquist. History of stroke. EXAM: CT HEAD WITHOUT CONTRAST CT CERVICAL SPINE WITHOUT CONTRAST TECHNIQUE: Multidetector CT imaging of the head and cervical spine was performed following the standard protocol without intravenous contrast. Multiplanar CT image reconstructions of the cervical spine were also generated. COMPARISON:  July 24, 2019 FINDINGS: CT HEAD FINDINGS Brain: There is acute extra-axial hemorrhage and acute intraparenchymal hemorrhage at the right inferior frontal lobe. The extra-axial component measures 7 mm. There is no midline shift or hydrocephalus. There is moderate-sized old right MCA infarct involving the basal ganglia, insula and frontal lobe. Vascular: No hyperdense vessel or unexpected calcification. Skull: Normal. Negative for fracture or focal lesion. Sinuses/Orbits: No acute finding. Other: Posterior occipital extracranial soft tissue swelling and hematoma is identified. CT CERVICAL SPINE FINDINGS Alignment: There is straightening of the cervical spine. Skull base and vertebrae: No acute fracture. No primary bone lesion or focal pathologic process. Soft tissues and spinal canal: No prevertebral fluid or swelling. No visible canal hematoma. Disc levels: Degenerative joint changes are identified throughout the cervical spine with narrowed joint space and osteophyte formation most prominently involving the C5-6 and C6-7 levels. Upper chest: Negative. Other: None. IMPRESSION: 1. Acute extra-axial hemorrhage and acute intraparenchymal hemorrhage at the right inferior frontal lobe. The extra-axial component measures 7 mm. 2. No acute fracture or dislocation of cervical  spine. 3. Degenerative joint changes of the cervical spine. These results were called by telephone at the time of interpretation on 07/16/2020 at 8:43 am to provider [facility] , who verbally acknowledged these results. Electronically Signed   By: Abelardo Diesel M.D.   On: 07/16/2020 08:46   CT Cervical Spine Wo Contrast  Result Date: 07/16/2020 CLINICAL DATA:  Status post fall this morning, hit the back the head. Patient is on Eloquist. History of stroke. EXAM: CT HEAD WITHOUT CONTRAST CT CERVICAL SPINE WITHOUT CONTRAST TECHNIQUE: Multidetector CT imaging of the head and cervical spine was performed following the standard protocol without intravenous contrast. Multiplanar CT image reconstructions of the cervical spine were also generated. COMPARISON:  July 24, 2019 FINDINGS: CT HEAD FINDINGS Brain: There is acute extra-axial hemorrhage and acute intraparenchymal hemorrhage at the right inferior frontal lobe. The extra-axial component measures 7 mm. There is no midline shift or hydrocephalus. There is moderate-sized old right MCA infarct involving the basal ganglia, insula and frontal lobe. Vascular: No hyperdense vessel or unexpected calcification. Skull: Normal. Negative for fracture or focal lesion. Sinuses/Orbits: No acute finding. Other: Posterior occipital extracranial soft tissue swelling and hematoma is identified. CT CERVICAL SPINE FINDINGS Alignment: There is straightening of the cervical spine. Skull base and vertebrae: No acute fracture. No primary bone lesion or focal pathologic process. Soft tissues and spinal canal: No prevertebral fluid or swelling. No visible canal hematoma. Disc levels: Degenerative joint changes are identified throughout the cervical spine with narrowed joint space and osteophyte formation most prominently involving the C5-6 and C6-7 levels. Upper chest: Negative. Other: None. IMPRESSION: 1. Acute extra-axial hemorrhage and acute intraparenchymal hemorrhage at the right  inferior  frontal lobe. The extra-axial component measures 7 mm. 2. No acute fracture or dislocation of cervical spine. 3. Degenerative joint changes of the cervical spine. These results were called by telephone at the time of interpretation on 07/16/2020 at 8:43 am to provider [facility] , who verbally acknowledged these results. Electronically Signed   By: Abelardo Diesel M.D.   On: 07/16/2020 08:46   DG Shoulder Left  Result Date: 07/16/2020 CLINICAL DATA:  Fall this morning. History of a left shoulder fracture. Elbow abrasion. EXAM: LEFT SHOULDER - 2+ VIEW COMPARISON:  03/30/2019 FINDINGS: No acute fracture.  No dislocation. Marked narrowing of the glenohumeral joint. Mild subchondral sclerosis along the glenoid with remottling/widening of the glenoid and mild flattening of the humeral head articular surface. There is also narrowing of the distal clavicle at the [facility] joint. AC joint normally aligned. Skeletal structures are diffusely demineralized. IMPRESSION: 1. No acute fracture or dislocation. 2. Advanced glenohumeral joint arthropathic changes chronic degenerative/arthropathic changes of the AC joint. Electronically Signed   By: Lajean Manes M.D.   On: 07/16/2020 08:38    Assessment/Plan: 88 y.o. female with right IPH. Significant blossom on repeat CT last night. This mornings follow up CT scan showed a stable appearence. Continue supportive care and frequent neuro checks. Maintain SBP < 160.    LOS: 0 days     Marvis Moeller, DNP, NP-C 07/17/2020, 8:16 AM

## 2020-07-17 NOTE — Progress Notes (Signed)
OT Cancellation Note  Patient Details Name: Renlee Floor MRN: [id number] DOB: 1930/12/12   Cancelled Treatment:    Reason Eval/Treat Not Completed: Patient at procedure or test/ unavailable.  Pt with PT.  Will reattempt.  Eber Jones., OTR/L Acute Rehabilitation Services Pager [phone] Office [phone]   Jeani Hawking M 07/17/2020, 2:02 PM

## 2020-07-17 NOTE — Evaluation (Addendum)
Occupational Therapy Evaluation Patient Details Name: Nancy Zamora MRN: [id number] DOB: 05/13/1931 Today's Date: 07/17/2020    History of Present Illness This 87 y.o. female admitted after sustaining a fall.  CT of head showed intraparenchymal hemorrhage Rt inferior frontal lobe.  Repeat CT scan showed marked progression of the bleed and underwent reversal of Eliquis.  PMH includes:  h/o CVA with residual Lt sided weakness, SVT, osteoporosis, HTN, AFib with rapid ventricular response   Clinical Impression   Pt admitted with above. She demonstrates the below listed deficits and will benefit from continued OT to maximize safety and independence with BADLs.  Pt seen in conjunction with PT.  She demonstrates Lt sided weakness which is residual from previous CVA, impaired balance, generalized weakness, decreased activity tolerance, impaired cognition, as well as motor perseveration.  She currently requires min A for grooming and mod - total A for ADLs, and mod A +2 for functional mobility.  She lives with her family, who along with hired caregivers, provide 24 hour assist.   Recommend CIR level rehab at discharge to increase her independence prior to returning home.       Follow Up Recommendations  CIR   Equipment Recommendations  None recommended by OT    Recommendations for Other Services       Precautions / Restrictions Precautions Precautions: Fall Restrictions Weight Bearing Restrictions: No      Mobility Bed Mobility Overal bed mobility: Needs Assistance Bed Mobility: Supine to Sit     Supine to sit: Mod assist;+2 for physical assistance     General bed mobility comments: max directional verbal cues to stay on task    Transfers Overall transfer level: Needs assistance Equipment used: 2 person hand held assist Transfers: Sit to/from Stand;Stand Pivot Transfers Sit to Stand: +2 physical assistance;+2 safety/equipment;Mod assist Stand pivot transfers: Mod assist;+2  physical assistance;+2 safety/equipment       General transfer comment: Pt requires assist to boost into standing and assist for balance    Balance Overall balance assessment: Needs assistance Sitting-balance support: Feet supported;No upper extremity supported Sitting balance-Leahy Scale: Poor Sitting balance - Comments: Pt initially required min A, progressed to min guard assist quickly   Standing balance support: Single extremity supported Standing balance-Leahy Scale: Poor Standing balance comment: requires UE support and mod A                           ADL either performed or assessed with clinical judgement   ADL Overall ADL's : Needs assistance/impaired Eating/Feeding: Moderate assistance;Sitting Eating/Feeding Details (indicate cue type and reason): Pt with poor appetite Grooming: Wash/dry hands;Wash/dry face;Brushing hair;Minimal assistance;Sitting Grooming Details (indicate cue type and reason): assist for thoroughness and perseveration noted Upper Body Bathing: Moderate assistance;Sitting   Lower Body Bathing: Maximal assistance;Sit to/from stand   Upper Body Dressing : Total assistance;Sitting   Lower Body Dressing: Total assistance;Sit to/from stand   Toilet Transfer: Moderate assistance;+2 for physical assistance;+2 for safety/equipment;Stand-pivot;BSC   Toileting- Clothing Manipulation and Hygiene: Total assistance;+2 for physical assistance;+2 for safety/equipment;Sit to/from stand       Functional mobility during ADLs: Moderate assistance;+2 for physical assistance;+2 for safety/equipment       Vision Baseline Vision/History: Wears glasses Wears Glasses: At all times Patient Visual Report: No change from baseline Additional Comments: Pt able to read clock independently.  EOMs full     Perception Perception Perception Tested?: Yes   Praxis Praxis Praxis tested?: Within functional limits  Pertinent Vitals/Pain       Hand Dominance  Right   Extremity/Trunk Assessment Upper Extremity Assessment Upper Extremity Assessment: RUE deficits/detail;LUE deficits/detail;Generalized weakness RUE Deficits / Details: arthritic deformity noted.  Shoulder flexion actively limited to ~80* (baseline) LUE Deficits / Details: Pt with h/o residual weakness from prior CVA as well as fx of shoulder (per son), resulting in limited shoulder ROM to ~45* LUE Coordination: decreased gross motor;decreased fine motor   Lower Extremity Assessment Lower Extremity Assessment: Defer to PT evaluation   Cervical / Trunk Assessment Cervical / Trunk Assessment: Normal   Communication Communication Communication: HOH   Cognition Arousal/Alertness: Awake/alert Behavior During Therapy: WFL for tasks assessed/performed Overall Cognitive Status: Impaired/Different from baseline Area of Impairment: Memory;Safety/judgement;Problem solving                     Memory: Decreased short-term memory   Safety/Judgement: Decreased awareness of safety;Decreased awareness of deficits   Problem Solving: Difficulty sequencing;Requires verbal cues;Requires tactile cues General Comments: pt HOH making some command following difficulty, per son family starting to notice decline cognitively. pt also perseverative   General Comments  VSS. Son present throughout    Exercises     Shoulder Instructions      Home Living Family/patient expects to be discharged to:: Private residence Living Arrangements: Alone (has 24/7) Available Help at Discharge: Family;Personal care attendant;Available 24 hours/day Type of Home: House Home Access: Stairs to enter [employer] of Steps: 3 Entrance Stairs-Rails: Right;Left Home Layout: Two level;Able to live on main level with bedroom/bathroom     Bathroom Shower/Tub: Tub/shower unit (with mechanical lift)   Bathroom Toilet: Standard Bathroom Accessibility: Yes   Home Equipment: Walker - 2 wheels;Walker -  4 wheels;Shower seat;Hand held shower head   Additional Comments: Pt has 24 hour care from family as well as hired caregivers      Prior Functioning/Environment Level of Independence: Needs Financial planner / Transfers Assistance Needed: used rollator ADL's / Homemaking Assistance Needed: assist from family/hired help            OT Problem List: Decreased strength;Decreased range of motion;Decreased activity tolerance;Impaired balance (sitting and/or standing);Decreased coordination;Decreased cognition;Decreased safety awareness;Decreased knowledge of use of DME or AE;Impaired UE functional use      OT Treatment/Interventions: Self-care/ADL training;Neuromuscular education;DME and/or AE instruction;Therapeutic activities;Cognitive remediation/compensation;Patient/family education;Balance training    OT Goals(Current goals can be found in the care plan section) Acute Rehab OT Goals Patient Stated Goal: Pt did not state OT Goal Formulation: With patient/family Time For Goal Achievement: 07/31/20 Potential to Achieve Goals: Good  OT Frequency: Min 2X/week   Barriers to D/C:            Co-evaluation PT/OT/SLP Co-Evaluation/Treatment: Yes Reason for Co-Treatment: Necessary to address cognition/behavior during functional activity;For patient/therapist safety;To address functional/ADL transfers PT goals addressed during session: Mobility/safety with mobility OT goals addressed during session: ADL's and self-care      AM-PAC OT "6 Clicks" Daily Activity     Outcome Measure Help from another person eating meals?: A Lot Help from another person taking care of personal grooming?: A Little Help from another person toileting, which includes using toliet, bedpan, or urinal?: A Lot Help from another person bathing (including washing, rinsing, drying)?: A Lot Help from another person to put on and taking off regular upper body clothing?: Total Help from another person to put on and  taking off regular lower body clothing?: Total 6 Click Score: 11   End of Session  Nurse Communication: Mobility status  Activity Tolerance: Patient tolerated treatment well Patient left: in chair;with call bell/phone within reach;with chair alarm set  OT Visit Diagnosis: Unsteadiness on feet (R26.81);Cognitive communication deficit (R41.841)                Time: [id number] OT Time Calculation (min): 51 min Charges:  OT General Charges $OT Visit: 1 Visit OT Evaluation $OT Eval Moderate Complexity: 1 Mod OT Treatments $Self Care/Home Management : 8-22 mins  Nilsa Nutting., OTR/L Acute Rehabilitation Services Pager [phone] Office [place], [place] 07/17/2020, 3:59 PM

## 2020-07-17 NOTE — Evaluation (Signed)
Physical Therapy Evaluation Patient Details Name: Nancy Zamora MRN: [id number] DOB: 12-02-1930 Today's Date: 07/17/2020   History of Present Illness  This 87 y.o. female admitted after sustaining a fall.  CT of head showed intraparenchymal hemorrhage Rt inferior frontal lobe.  Repeat CT scan showed marked progression of the bleed and underwent reversal of Eliquis.  PMH includes:  h/o CVA with residual Lt sided weakness, SVT, osteoporosis, HTN, AFib with rapid ventricular response  Clinical Impression  Pt admitted with above. Pt was able to ambulate with rollator and received assist for ADLs PTA. Pt with residual L sided weakness from previous stroke. Pt alert and oriented but easily distracted which leads to perseveration and tangential speech. Pt with poor short term memory but able to recall past events and jobs accurately. Pt requiring modAX3 for safe mobility at this time. Pt with good home set up and 24/7 support. Recommend CIR upon d/c to allow for maximal functional recovery for safe transition home with support who can provide min A. Acute PT to cont to follow.    Follow Up Recommendations CIR    Equipment Recommendations   (has DME at home)    Recommendations for Other Services       Precautions / Restrictions Precautions Precautions: Fall Restrictions Weight Bearing Restrictions: No      Mobility  Bed Mobility Overal bed mobility: Needs Assistance Bed Mobility: Supine to Sit     Supine to sit: Mod assist;+2 for physical assistance     General bed mobility comments: max directional verbal cues to stay on task    Transfers Overall transfer level: Needs assistance Equipment used: 2 person hand held assist Transfers: Sit to/from Stand;Stand Pivot Transfers Sit to Stand: +2 physical assistance;+2 safety/equipment;Mod assist Stand pivot transfers: Mod assist;+2 physical assistance;+2 safety/equipment       General transfer comment: Pt requires assist to boost into  standing and assist for balance  Ambulation/Gait Ambulation/Gait assistance: Mod assist;Max assist;+2 physical assistance Gait Distance (Feet): 2 Feet Assistive device: 2 person hand held assist Gait Pattern/deviations: Step-through pattern;Decreased stride length;Shuffle;Narrow base of support Gait velocity: slow Gait velocity interpretation: 1.31 - 2.62 ft/sec, indicative of limited community ambulator General Gait Details: pt with posterior bias, pt reports "i'm going to pee my pants" assisted to [place]            Wheelchair Mobility    Modified Rankin (Stroke Patients Only) Modified Rankin (Stroke Patients Only) Pre-Morbid Rankin Score: Moderately severe disability Modified Rankin: Severe disability     Balance Overall balance assessment: Needs assistance Sitting-balance support: Feet supported;No upper extremity supported Sitting balance-Leahy Scale: Poor Sitting balance - Comments: Pt initially required min A, progressed to min guard assist quickly   Standing balance support: Single extremity supported Standing balance-Leahy Scale: Poor Standing balance comment: requires UE support and mod A to maintain standing for pericare                             Pertinent Vitals/Pain      Home Living Family/patient expects to be discharged to:: Private residence Living Arrangements: Alone (has 24/7) Available Help at Discharge: Family;Personal care attendant;Available 24 hours/day Type of Home: House Home Access: Stairs to enter Entrance Stairs-Rails: Psychiatric nurse of Steps: 3 Home Layout: Two level;Able to live on main level with bedroom/bathroom Home Equipment: Gilford Rile - 2 wheels;Walker - 4 wheels;Shower seat;Hand held shower head Additional Comments: Pt has 24 hour care from family  as well as hired caregivers    Prior Function Level of Independence: Needs assistance   Gait / Transfers Assistance Needed: used rollator  ADL's /  Homemaking Assistance Needed: assist from family/hired help        Hand Dominance   Dominant Hand: Right    Extremity/Trunk Assessment   Upper Extremity Assessment Upper Extremity Assessment: Defer to OT evaluation (pt with known L UE fracture however no-op) RUE Deficits / Details: arthritic deformity noted.  Shoulder flexion actively limited to ~80* (baseline) LUE Deficits / Details: Pt with h/o residual weakness from prior CVA as well as fx of shoulder (per son), resulting in limited shoulder ROM to ~45* LUE Coordination: decreased gross motor;decreased fine motor    Lower Extremity Assessment Lower Extremity Assessment: Generalized weakness (L weaker than R from previous stroke)    Cervical / Trunk Assessment Cervical / Trunk Assessment: Normal  Communication   Communication: HOH  Cognition Arousal/Alertness: Awake/alert Behavior During Therapy: WFL for tasks assessed/performed Overall Cognitive Status: Impaired/Different from baseline Area of Impairment: Memory;Safety/judgement;Problem solving                     Memory: Decreased short-term memory   Safety/Judgement: Decreased awareness of safety;Decreased awareness of deficits   Problem Solving: Difficulty sequencing;Requires verbal cues;Requires tactile cues General Comments: pt HOH making some command following difficulty, per son family starting to notice decline cognitively. pt also perseverative      General Comments General comments (skin integrity, edema, etc.): VSS, SOn present, assisted to [facility], dependent for hygiene    Exercises     Assessment/Plan    PT Assessment Patient needs continued PT services  PT Problem List Decreased strength;Decreased activity tolerance;Decreased balance;Decreased mobility;Decreased coordination;Decreased cognition;Decreased knowledge of use of DME;Decreased safety awareness       PT Treatment Interventions DME instruction;Gait training;Stair training;Functional  mobility training;Therapeutic activities;Therapeutic exercise;Balance training;Neuromuscular re-education    PT Goals (Current goals can be found in the Care Plan section)  Acute Rehab PT Goals Patient Stated Goal: Pt did not state PT Goal Formulation: With patient/family Time For Goal Achievement: 07/31/20 Potential to Achieve Goals: Good    Frequency Min 4X/week   Barriers to discharge        Co-evaluation PT/OT/SLP Co-Evaluation/Treatment: Yes Reason for Co-Treatment: Necessary to address cognition/behavior during functional activity PT goals addressed during session: Mobility/safety with mobility OT goals addressed during session: ADL's and self-care       AM-PAC PT "6 Clicks" Mobility  Outcome Measure Help needed turning from your back to your side while in a flat bed without using bedrails?: A Lot Help needed moving from lying on your back to sitting on the side of a flat bed without using bedrails?: A Lot Help needed moving to and from a bed to a chair (including a wheelchair)?: A Lot Help needed standing up from a chair using your arms (e.g., wheelchair or bedside chair)?: A Lot Help needed to walk in hospital room?: A Lot Help needed climbing 3-5 steps with a railing? : Total 6 Click Score: 11    End of Session   Activity Tolerance: Patient tolerated treatment well Patient left: in chair;with call bell/phone within reach;with chair alarm set;with family/visitor present Nurse Communication: Mobility status PT Visit Diagnosis: Unsteadiness on feet (R26.81);Difficulty in walking, not elsewhere classified (R26.2)    Time: [id number] PT Time Calculation (min) (ACUTE ONLY): 52 min   Charges:   PT Evaluation $PT Eval Moderate Complexity: 1 Mod PT Treatments $Therapeutic Activity:  8-22 mins        Lewis Shock, PT, DPT Acute Rehabilitation Services Pager #: [phone] Office #: [phone]   Iona Hansen 07/17/2020, 4:06 PM

## 2020-07-17 NOTE — Progress Notes (Signed)
Patient refused nighttime medication stating "My mother doesn't want me to take it." Attempts by nurse to educate and reorient patient failed.

## 2020-07-18 MED ORDER — LOSARTAN POTASSIUM 50 MG PO TABS
25.0000 mg | ORAL_TABLET | ORAL | Status: DC
  Administered 2020-07-20 – 2020-07-28 (×5): 25 mg via ORAL
  Filled 2020-07-18 (×5): qty 1

## 2020-07-18 MED ORDER — SODIUM CHLORIDE 0.9 % IV BOLUS
500.0000 mL | Freq: Once | INTRAVENOUS | Status: AC
  Administered 2020-07-18: 500 mL via INTRAVENOUS

## 2020-07-18 MED ORDER — SODIUM CHLORIDE 0.9 % IV SOLN: INTRAVENOUS | Status: DC

## 2020-07-18 MED ORDER — POLYETHYLENE GLYCOL 3350 17 G PO PACK
17.0000 g | PACK | Freq: Every day | ORAL | Status: DC
  Administered 2020-07-19 – 2020-07-27 (×9): 17 g via ORAL
  Filled 2020-07-18 (×9): qty 1

## 2020-07-18 MED ORDER — IPRATROPIUM BROMIDE 0.06 % NA SOLN
2.0000 | Freq: Four times a day (QID) | NASAL | Status: DC | PRN
  Administered 2020-07-20: 2 via NASAL
  Filled 2020-07-18: qty 15

## 2020-07-18 NOTE — Progress Notes (Signed)
BP's consistently soft. Last BP 78/55 MAP 64 HR 70 Patient asymptomatic, sitting up in chair with daughter at bedside. PO meds adjusted yesterday and this AM.  PO intake poor despite staff assistance and encouragement/cues.  NP Kim aware- continuous IVF and bolus ordered. Patient and daughter educated with good understanding verbalized.

## 2020-07-18 NOTE — Progress Notes (Signed)
NEUROSURGERY PROGRESS NOTE  S/p right intraparenchymal hemorrhage and sah. CT yesterday and patients anticoagulant was reversed. She is stable today, resting comfortably in bed. Not a surgical candidate. Continue supportive care.    Temp:  [98 F (36.7 C)-98.5 F (36.9 C)] 98.5 F (36.9 C) (01/02 0830) Pulse Rate:  [58-77] 72 (01/02 0900) Resp:  [14-23] 18 (01/02 0900) BP: (91-169)/(54-110) 126/71 (01/02 0900) SpO2:  [92 %-98 %] 96 % (01/02 0900)   Sherryl Manges, NP 07/18/2020 10:21 AM

## 2020-07-18 NOTE — Plan of Care (Signed)
  Problem: Elimination: Goal: Will not experience complications related to bowel motility Outcome: Progressing     Patient's daughter states she normally takes Miralax QD for bowel regularity. Neurosx on rounds and aware, order obtained. Patient with +BM yesterday and today.

## 2020-07-19 DIAGNOSIS — I619 Nontraumatic intracerebral hemorrhage, unspecified: Secondary | ICD-10-CM

## 2020-07-19 NOTE — Progress Notes (Signed)
   Providing Compassionate, Quality Care - Together  NEUROSURGERY PROGRESS NOTE   S: No issues overnight. No new complaints, no seizure activity  O: EXAM:  BP 113/78   Pulse 68   Temp 98 F (36.7 C) (Axillary)   Resp 17   SpO2 96%   Awake, alert, oriented x2 Speech fluent, appropriate  CNs grossly intact  MAE Face symmetric R periorbital ecchymoses  ASSESSMENT:  87 y.o. female with  1. Right frontal traumatic IPH, on eliquis  PLAN: - pt/ot eval for cir -transfer to floor -AEDs -contin supportive care    Thank you for allowing me to participate in this patient's care.  Please do not hesitate to call with questions or concerns.   Monia Pouch, DO Neurosurgeon [facility] Cell: [phone]

## 2020-07-19 NOTE — Consult Note (Addendum)
Physical Medicine and Rehabilitation Consult Reason for Consult: Altered mental status after a fall Referring Physician: Dr. Reatha Armour   HPI: Nancy Zamora is a 87 y.o. 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  Per chart review patient lives alone.  Two-level home bed and bath main level 3 steps to entry.  She has a 24-hour caregiver as well as family support.  She needed assistance for ADLs and mobility using a Rollator prior to admission.  Presented 07/16/2020 after a fall getting out of bed denied loss of consciousness.  Cranial CT scan showed acute extra-axial hemorrhage and acute intraparenchymal hemorrhage at the right inferior frontal lobe.  No acute fracture or dislocation.  Admission chemistries unremarkable except creatinine 1.03.  Her Eliquis was held due to [facility] and she received Kcentra.  Follow-up cranial CT scan 07/16/2020 and 07/17/2020 showed marked progression from earlier study of right frontal parenchymal hematoma small adjacent subarachnoid hemorrhage local mass-effect in the right frontal lobe on the right frontal horn.  Chronic infarct right frontal lobe which shows a small amount of acute hemorrhage new from earlier comparison.  Follow-up neurosurgery Dr. Reatha Armour advised conservative care.  Therapy evaluations completed with recommendations of physical medicine rehab consult.  Patient has family as well as 24/7 hired caregivers since her stroke in 2020  Review of Systems  Constitutional: Negative for chills and fever.  HENT: Negative for hearing loss.   Eyes: Negative for blurred vision and double vision.  Respiratory: Negative for cough and shortness of breath.   Cardiovascular: Positive for palpitations and leg swelling. Negative for chest pain.  Gastrointestinal: Positive for constipation. Negative for heartburn, nausea and vomiting.  Genitourinary: Negative  for hematuria.  Skin: Negative for rash.  Neurological: Positive for weakness.  All other systems reviewed and are negative.  Past Medical History:  Diagnosis Date  . Atrial fibrillation with rapid ventricular response ([place])   . CVA (cerebral vascular accident) ([place])   . Dysphagia   . Hypertension   . Osteoporosis   . SVT (supraventricular tachycardia) ([facility])    Past Surgical History:  Procedure Laterality Date  . IR ANGIO INTRA EXTRACRAN SEL COM CAROTID INNOMINATE UNI R MOD SED  03/26/2019  . IR ANGIO VERTEBRAL SEL SUBCLAVIAN INNOMINATE UNI R MOD SED  03/26/2019  . RADIOLOGY WITH ANESTHESIA Left 03/26/2019   Procedure: RADIOLOGY WITH ANESTHESIA;  Surgeon: Luanne Bras, MD;  Location: [place];  Service: Radiology;  Laterality: Left;   History reviewed. No pertinent family history. Social History:  reports that she has never smoked. She has never used smokeless tobacco. No history on file for alcohol use and drug use. Allergies:  Allergies  Allergen Reactions  . Penicillins Hives  . Tetracyclines & Related Hives  . Chocolate Other (See Comments)    Mouth ulcers  . Gabapentin Nausea And Vomiting  . Tape Rash    Tears skin   Medications Prior to Admission  Medication Sig Dispense Refill  . acetaminophen (TYLENOL) 500 MG tablet Take 500 mg by mouth every 8 (eight) hours as needed for mild pain or fever.    Marland Kitchen acyclovir (ZOVIRAX) 800 MG tablet Take 800 mg by mouth 3 (three) times daily as needed (flare up).    Marland Kitchen apixaban (ELIQUIS) 2.5 MG TABS tablet Take 1 tablet (2.5 mg total) by mouth 2 (two) times daily. 60 tablet   . atorvastatin (LIPITOR) 20 MG tablet Take 1 tablet (20  mg total) by mouth daily at 6 PM.    . calcium-vitamin D (OSCAL WITH D) 500-200 MG-UNIT TABS tablet Take 1 tablet by mouth daily.    . Carboxymethylcell-Hypromellose 0.25-0.3 % GEL Place 1 drop into both eyes daily.    . Cholecalciferol 25 MCG (1000 UT) tablet Take 1,000 Units by mouth daily.    . diclofenac  Sodium (VOLTAREN) 1 % GEL Apply 2 g topically 3 (three) times daily. Shoulders and knees    . escitalopram (LEXAPRO) 5 MG tablet Take 5 mg by mouth daily.    . fexofenadine (ALLEGRA) 180 MG tablet Take 180 mg by mouth daily.    . flecainide (TAMBOCOR) 50 MG tablet Take 50 mg by mouth 2 (two) times daily.    . furosemide (LASIX) 20 MG tablet Take 20 mg by mouth daily.    . hydrocortisone (ANUSOL-HC) 25 MG suppository Place 25 mg rectally 2 (two) times daily as needed for hemorrhoids.    Marland Kitchen ipratropium (ATROVENT) 0.06 % nasal spray Place 2 sprays into both nostrils 4 (four) times daily as needed for rhinitis.    Marland Kitchen levothyroxine (SYNTHROID) 112 MCG tablet Take 1 tablet (112 mcg total) by mouth daily. (Patient taking differently: Take 112-168 mcg by mouth See admin instructions. Takes 1.5 tablets (168 mcg totally) by mouth on Mon and Tues; takes 1 tablet (112 mcg totally) by mouth on other days)    . losartan (COZAAR) 25 MG tablet Take 25 mg by mouth every other day.    . metoprolol tartrate (LOPRESSOR) 50 MG tablet Take 25 mg by mouth 2 (two) times daily.    . Multiple Vitamin (MULTIVITAMIN) capsule Take 1 capsule by mouth daily.    Marland Kitchen MYRBETRIQ 25 MG TB24 tablet Take 25 mg by mouth daily.    Marland Kitchen omeprazole (PRILOSEC) 40 MG capsule Take 40 mg by mouth 2 (two) times daily.    . polyethylene glycol (MIRALAX / GLYCOLAX) 17 g packet Take 17 g by mouth daily.    . traMADol (ULTRAM) 50 MG tablet Take 50 mg by mouth 2 (two) times daily.    . vitamin C (ASCORBIC ACID) 500 MG tablet Take 500 mg by mouth daily.      Home: Home Living Family/patient expects to be discharged to:: Private residence Living Arrangements: Alone (has 24/7) Available Help at Discharge: Family,Personal care attendant,Available 24 hours/day Type of Home: House Home Access: Stairs to enter [employer] of Steps: 3 Entrance Stairs-Rails: [place]: Two level,Able to live on main level with  bedroom/bathroom Bathroom Shower/Tub: Tub/shower unit (with mechanical lift) Bathroom Toilet: Standard Bathroom Accessibility: Yes Home Equipment: Walker - 2 wheels,Walker - 4 wheels,Shower seat,Hand held shower head Additional Comments: Pt has 24 hour care from family as well as hired caregivers  Functional History: Prior Function Level of Independence: Needs Licensed conveyancer / Transfers Assistance Needed: used rollator ADL's / Homemaking Assistance Needed: assist from family/hired help Functional Status:  Mobility: Bed Mobility Overal bed mobility: Needs Assistance Bed Mobility: Supine to Sit Supine to sit: Mod assist,+2 for physical assistance General bed mobility comments: max directional verbal cues to stay on task Transfers Overall transfer level: Needs assistance Equipment used: 2 person hand held assist Transfers: Sit to/from [employer] Sit to Stand: +2 physical assistance,+2 safety/equipment,Mod assist Stand pivot transfers: Mod assist,+2 physical assistance,+2 safety/equipment General transfer comment: Pt requires assist to boost into standing and assist for balance Ambulation/Gait Ambulation/Gait assistance: Mod assist,Max assist,+2 physical assistance Gait Distance (Feet): 2 Feet Assistive  device: 2 person hand held assist Gait Pattern/deviations: Step-through pattern,Decreased stride length,Shuffle,Narrow base of support General Gait Details: pt with posterior bias, pt reports "i'm going to pee my pants" assisted to [facility] Gait velocity: slow Gait velocity interpretation: 1.31 - 2.62 ft/sec, indicative of limited community ambulator    ADL: ADL Overall ADL's : Needs assistance/impaired Eating/Feeding: Moderate assistance,Sitting Eating/Feeding Details (indicate cue type and reason): Pt with poor appetite Grooming: Wash/dry hands,Wash/dry face,Brushing hair,Minimal assistance,Sitting Grooming Details (indicate cue type and reason): assist for  thoroughness and perseveration noted Upper Body Bathing: Moderate assistance,Sitting Lower Body Bathing: Maximal assistance,Sit to/from stand Upper Body Dressing : Total assistance,Sitting Lower Body Dressing: Total assistance,Sit to/from stand Toilet Transfer: Moderate assistance,+2 for physical assistance,+2 for safety/equipment,Stand-pivot,BSC Toileting- Clothing Manipulation and Hygiene: Total assistance,+2 for physical assistance,+2 for safety/equipment,Sit to/from stand Functional mobility during ADLs: Moderate assistance,+2 for physical assistance,+2 for safety/equipment  Cognition: Cognition Overall Cognitive Status: Impaired/Different from baseline Orientation Level: Oriented X4 Cognition Arousal/Alertness: Awake/alert Behavior During Therapy: WFL for tasks assessed/performed Overall Cognitive Status: Impaired/Different from baseline Area of Impairment: Memory,Safety/judgement,Problem solving Memory: Decreased short-term memory Safety/Judgement: Decreased awareness of safety,Decreased awareness of deficits Problem Solving: Difficulty sequencing,Requires verbal cues,Requires tactile cues General Comments: pt HOH making some command following difficulty, per son family starting to notice decline cognitively. pt also perseverative  Blood pressure 126/61, pulse 72, temperature 98.2 F (36.8 C), temperature source Oral, resp. rate 15, SpO2 93 %. Physical Exam Neurological:     Comments: Patient is alert but easily distracted.  Provides her name and age with some delay in processing.  Follows simple commands.    General: No acute distress Mood and affect are appropriate Heart: Regular rate and rhythm no rubs murmurs or extra sounds Lungs: Clear to auscultation, breathing unlabored, no rales or wheezes Abdomen: Positive bowel sounds, soft nontender to palpation, nondistended Extremities: No clubbing, cyanosis, or edema Skin: No evidence of breakdown, no evidence of  rash Neurologic: 4 - left deltoid bicep tricep grip, 4 at the left hip flexor knee extensor ankle dorsiflexor 5/5 in the right deltoid , biceps, triceps, grip,  4+ in the right hip flexor knee extensor ankle dorsiflexor  Musculoskeletal: Reduced range of motion left shoulder, mild pain at end range shoulder flexion and abduction.   No results found for this or any previous visit (from the past 24 hour(s)). No results found.   Assessment/Plan: Diagnosis: Right frontal traumatic intraparenchymal hemorrhage 1. Does the need for close, 24 hr/day medical supervision in concert with the patient's rehab needs make it unreasonable for this patient to be served in a less intensive setting? Yes 2. Co-Morbidities requiring supervision/potential complications: Prior CVA with left hemiparesis, history of atrial fibrillation off anticoagulation, severe osteoarthritis left shoulder 3. Due to bladder management, bowel management, safety, skin/wound care, disease management, medication administration, pain management and patient education, does the patient require 24 hr/day rehab nursing? Yes 4. Does the patient require coordinated care of a physician, rehab nurse, therapy disciplines of PT, OT, speech to address physical and functional deficits in the context of the above medical diagnosis(es)? Yes Addressing deficits in the following areas: balance, endurance, locomotion, strength, transferring, bowel/bladder control, bathing, dressing, toileting, cognition and psychosocial support 5. Can the patient actively participate in an intensive therapy program of at least 3 hrs of therapy per day at least 5 days per week? Yes 6. The potential for patient to make measurable gains while on inpatient rehab is fair 7. Anticipated functional outcomes upon discharge from inpatient rehab are  min assist and mod assist  with PT, min assist and mod assist with OT, supervision with SLP. 8. Estimated rehab length of stay to reach  the above functional goals is: 10 to 14 days 9. Anticipated discharge destination: Home 10. Overall Rehab/Functional Prognosis: fair  RECOMMENDATIONS: This patient's condition is appropriate for continued rehabilitative care in the following setting: CIR Patient has agreed to participate in recommended program. Yes Note that insurance prior authorization may be required for reimbursement for recommended care.  Comment: 2 person mod assist currently, 1 person physical assist at home, goals would be to improve to 1 person physical assistance   Cathlyn Parsons, PA-C 07/19/2020   "I have personally performed a face to face diagnostic evaluation of this patient.  Additionally, I have reviewed and concur with the physician assistant's documentation above." Charlett Blake M.D. [facility] FAAPM&R (Neuromuscular Med) Diplomate Am Board of Electrodiagnostic Med Fellow Am Board of Interventional Pain

## 2020-07-19 NOTE — Progress Notes (Signed)
Inpatient Rehabilitation-Admissions Coordinator   Met with pt bedside as follow up from PM&R MD consult. Please see formal consult by Dr. Letta Pate for details. Discussed recommended rehab program with pt; she appears interested but asked me to follow up with her sons and daughter. Discussed program details, anticipated LOS, and expected functional outcomes with pt's daughter. She would like to speak with her brothers this evening before making a determination. Will follow up tomorrow for final decision and then begin insurance auth process for CIR if that is the preferred venue.   Raechel Ache, OTR/L  Rehab Admissions Coordinator  [phone] 07/19/2020 3:40 PM

## 2020-07-19 NOTE — Progress Notes (Signed)
Physical Therapy Treatment Patient Details Name: Nancy Zamora MRN: [id number] DOB: 1931-04-30 Today's Date: 07/19/2020    History of Present Illness This 87 y.o. female admitted after sustaining a fall.  CT of head showed intraparenchymal hemorrhage Rt inferior frontal lobe.  Repeat CT scan showed marked progression of the bleed and underwent reversal of Eliquis.  PMH includes:  h/o CVA with residual Lt sided weakness, SVT, osteoporosis, HTN, AFib with rapid ventricular response    PT Comments    The pt continues to make good progress with mobility and PT goals at this time. She was able to complete multiple sit-stand transfers from recliner, but continues to benefit from [facility] of 2 to power up and steady with maxA at times due to strong posterior lean initially. The pt then completed a series of standing exercises such as standing marches and reaches with RUE in effort to improve standing position and balance, and achieved slight reduction in posterior lean. The pt was then able to complete short ambulation in her room with chair follow and modA of 2. The pt requires significant assist and UE support, but was able to advance BLE without assist and had slight improvement in posterior lean with forward movement. The pt will continue to benefit from skilled PT to progress functional strength, coordination, stability, and endurance. Continue to recommend CIR.    Follow Up Recommendations  CIR     Equipment Recommendations   (defer to post acute)    Recommendations for Other Services       Precautions / Restrictions Precautions Precautions: Fall Precaution Comments: residual L-sided weakness from prior stroke Restrictions Weight Bearing Restrictions: No Other Position/Activity Restrictions: pt with very limited ROM to L shoulder (due to pain)    Mobility  Bed Mobility Overal bed mobility: Needs Assistance             General bed mobility comments: pt sitting up in recliner at start  and end of session  Transfers Overall transfer level: Needs assistance Equipment used: 2 person hand held assist Transfers: Sit to/from Stand Sit to Stand: Mod assist;+2 physical assistance;+2 safety/equipment         General transfer comment: strong posterior lean, modA to power up and steady  Ambulation/Gait Ambulation/Gait assistance: Mod assist;+2 physical assistance;+2 safety/equipment Gait Distance (Feet): 8 Feet Assistive device: 2 person hand held assist Gait Pattern/deviations: Decreased stride length;Shuffle;Narrow base of support;Step-to pattern;Leaning posteriorly Gait velocity: slow   General Gait Details: leaning posteriorly with initial stand, less severe with gait, but still with some lean. requires modA of 2 and benefits from chair follow for safety. no assist to advance legs but minimal clearance    Modified Rankin (Stroke Patients Only) Modified Rankin (Stroke Patients Only) Pre-Morbid Rankin Score: Moderately severe disability Modified Rankin: Severe disability     Balance Overall balance assessment: Needs assistance Sitting-balance support: Feet supported;No upper extremity supported Sitting balance-Leahy Scale: Poor Sitting balance - Comments: pt requiring minG to pull forward and sit unsupported Postural control: Posterior lean Standing balance support: Single extremity supported Standing balance-Leahy Scale: Poor Standing balance comment: UE support and modA of 2 to maintain static and dynamic stance.                            Cognition Arousal/Alertness: Awake/alert Behavior During Therapy: WFL for tasks assessed/performed Overall Cognitive Status: Impaired/Different from baseline Area of Impairment: Memory;Safety/judgement;Problem solving  Memory: Decreased short-term memory   Safety/Judgement: Decreased awareness of safety;Decreased awareness of deficits   Problem Solving: Difficulty  sequencing;Requires verbal cues;Requires tactile cues General Comments: pt HOH and needs repeated cues/commands at times for that reason. Able to follow commands she does hear well. making conersation with son, decreased initiation, especially in regards to safety with movement      Exercises Other Exercises Other Exercises: standing marches x10 , maxA of 2 to maintain upright due to posterior lean Other Exercises: standing reaching with RUE, modA of 2 to stand and maintain balance, slight improvement in posterior lean Other Exercises: PROM LUE, pain limited    General Comments General comments (skin integrity, edema, etc.): VSS      Pertinent Vitals/Pain Pain Assessment: Faces Faces Pain Scale: Hurts even more Pain Location: L shoudler with PROM > 10-15 degrees flex or ABD Pain Descriptors / Indicators: Grimacing;Sore Pain Intervention(s): Monitored during session;Limited activity within patient's tolerance;Repositioned           PT Goals (current goals can now be found in the care plan section) Acute Rehab PT Goals Patient Stated Goal: Pt did not state PT Goal Formulation: With patient/family Time For Goal Achievement: 07/31/20 Potential to Achieve Goals: Good Progress towards PT goals: Progressing toward goals    Frequency    Min 4X/week      PT Plan Current plan remains appropriate       AM-PAC PT "6 Clicks" Mobility   Outcome Measure  Help needed turning from your back to your side while in a flat bed without using bedrails?: A Lot Help needed moving from lying on your back to sitting on the side of a flat bed without using bedrails?: A Lot Help needed moving to and from a bed to a chair (including a wheelchair)?: A Lot Help needed standing up from a chair using your arms (e.g., wheelchair or bedside chair)?: A Lot Help needed to walk in hospital room?: A Lot Help needed climbing 3-5 steps with a railing? : Total 6 Click Score: 11    End of Session  Equipment Utilized During Treatment: Gait belt Activity Tolerance: Patient tolerated treatment well Patient left: in chair;with call bell/phone within reach;with chair alarm set;with family/visitor present Nurse Communication: Mobility status PT Visit Diagnosis: Unsteadiness on feet (R26.81);Difficulty in walking, not elsewhere classified (R26.2)     Time: [id number] PT Time Calculation (min) (ACUTE ONLY): 29 min  Charges:  $Gait Training: 8-22 mins $Therapeutic Activity: 8-22 mins                     Karma Ganja, PT, DPT   Acute Rehabilitation Department Pager #: [phone]   Otho Bellows 07/19/2020, 5:16 PM

## 2020-07-20 MED ORDER — HEPARIN SODIUM (PORCINE) 5000 UNIT/ML IJ SOLN
5000.0000 [IU] | Freq: Two times a day (BID) | INTRAMUSCULAR | Status: DC
  Administered 2020-07-20 – 2020-07-28 (×18): 5000 [IU] via SUBCUTANEOUS
  Filled 2020-07-20 (×18): qty 1

## 2020-07-20 MED ORDER — DEXAMETHASONE SODIUM PHOSPHATE 4 MG/ML IJ SOLN: 2.0000 mg | Freq: Three times a day (TID) | INTRAMUSCULAR | Status: DC

## 2020-07-20 MED ORDER — DEXAMETHASONE SODIUM PHOSPHATE 4 MG/ML IJ SOLN
2.0000 mg | Freq: Three times a day (TID) | INTRAMUSCULAR | Status: DC
  Administered 2020-07-20 – 2020-07-25 (×14): 2 mg via INTRAVENOUS
  Filled 2020-07-20 (×14): qty 1

## 2020-07-20 MED ORDER — ACYCLOVIR 200 MG PO CAPS
800.0000 mg | ORAL_CAPSULE | Freq: Three times a day (TID) | ORAL | Status: DC
  Administered 2020-07-20 – 2020-07-28 (×27): 800 mg via ORAL
  Filled 2020-07-20 (×29): qty 4

## 2020-07-20 MED ORDER — TRAMADOL HCL 50 MG PO TABS
50.0000 mg | ORAL_TABLET | Freq: Two times a day (BID) | ORAL | Status: DC | PRN
  Administered 2020-07-20 – 2020-07-26 (×8): 50 mg via ORAL
  Filled 2020-07-20 (×8): qty 1

## 2020-07-20 MED ORDER — SODIUM CHLORIDE 0.9 % IV BOLUS
250.0000 mL | Freq: Once | INTRAVENOUS | Status: AC
  Administered 2020-07-20: 250 mL via INTRAVENOUS

## 2020-07-20 NOTE — Progress Notes (Signed)
Notified Dr. Jake Samples that BP is 78/44. Received new orders for 250 ml NS bolus.

## 2020-07-20 NOTE — TOC CAGE-AID Note (Signed)
Transition of Care [facility]) - CAGE-AID Screening   Patient Details  Name: Nancy Zamora MRN: [id number] Date of Birth: 1931/01/21   Clinical Narrative: Patient denies current alcohol and drug use.   CAGE-AID Screening:    Have You Ever Felt You Ought to Cut Down on Your Drinking or Drug Use?: No Have People Annoyed You By Critizing Your Drinking Or Drug Use?: No Have You Felt Bad Or Guilty About Your Drinking Or Drug Use?: No Have You Ever Had a Drink or Used Drugs First Thing In The Morning to Steady Your Nerves or to Get Rid of a Hangover?: No CAGE-AID Score: 0  Substance Abuse Education Offered: No

## 2020-07-20 NOTE — Progress Notes (Signed)
Physical Therapy Treatment Patient Details Name: Nancy Zamora MRN: [id number] DOB: [birth year] Today's Date: 07/20/2020    History of Present Illness This 87 y.o. female admitted after sustaining a fall.  CT of head showed intraparenchymal hemorrhage Rt inferior frontal lobe.  Repeat CT scan showed marked progression of the bleed and underwent reversal of Eliquis.  PMH includes:  h/o CVA with residual Lt sided weakness, SVT, osteoporosis, HTN, AFib with rapid ventricular response    PT Comments    Pt with c/o 10/10 headache and more lethargy today. Pt seen with OT to progress ambulation and OOB mobility. Pt continues with posterior bias/retropulsion requiring mod/maxA to correct and promote anterior stepping pattern. Continue to recommend CIR Upon d/c for maximal functional recovery. Acute PT to cont to follow.    Follow Up Recommendations  CIR     Equipment Recommendations   (defer to post acute)    Recommendations for Other Services       Precautions / Restrictions Precautions Precautions: Fall Precaution Comments: residual L-sided weakness from prior stroke/ hx L shoulder fx with pending workup outpatient per family Restrictions Other Position/Activity Restrictions: pt with very limited ROM to L shoulder (due to pain)    Mobility  Bed Mobility               General bed mobility comments: in chair upon therapy arrival  Transfers Overall transfer level: Needs assistance Equipment used: 2 person hand held assist Transfers: Sit to/from Stand Sit to Stand: +2 physical assistance;+2 safety/equipment;Max assist Stand pivot transfers: +2 physical assistance;Mod assist       General transfer comment: assist to place bilat hands on RW, assist to bring weight forward onto walker to prevent posterior bias  Ambulation/Gait Ambulation/Gait assistance: Mod assist;+2 physical assistance;+2 safety/equipment Gait Distance (Feet): 10 Feet (x2, to/from bathroom) Assistive device:  2 person hand held assist;Rolling walker (2 wheeled) Gait Pattern/deviations: Decreased stride length;Shuffle;Narrow base of support;Step-to pattern;Leaning posteriorly Gait velocity: slow   General Gait Details: pt requiring modAx2 to bring weight anterior onto walker to prevent posterior bias, modA to propel walker forward to promote stepping pattern   Stairs             Wheelchair Mobility    Modified Rankin (Stroke Patients Only) Modified Rankin (Stroke Patients Only) Pre-Morbid Rankin Score: Moderately severe disability Modified Rankin: Severe disability     Balance Overall balance assessment: Needs assistance Sitting-balance support: Bilateral upper extremity supported;Feet supported Sitting balance-Leahy Scale: Poor Sitting balance - Comments: pt requiring minG to pull forward and sit unsupported Postural control: Posterior lean Standing balance support: Bilateral upper extremity supported;During functional activity Standing balance-Leahy Scale: Poor Standing balance comment: reliant on therapist and posterior bias. therapist has to help pt reach with bil LE for RW                            Cognition Arousal/Alertness: Awake/alert Behavior During Therapy: [facility] for tasks assessed/performed Overall Cognitive Status: Impaired/Different from baseline Area of Impairment: Memory;Safety/judgement;Problem solving;Orientation;Awareness                 Orientation Level: Disoriented to;Place;Time;Situation (requesting granddaughter by name)   Memory: Decreased short-term memory   Safety/Judgement: Decreased awareness of safety;Decreased awareness of deficits Awareness: Intellectual Problem Solving: Difficulty sequencing;Requires verbal cues;Requires tactile cues General Comments: HOH and following 1 step commands. pt asking for granddaughter that normally sits with her daily byname. daughter telling patient she is not at hospital an  dpt wants to know  WhY      Exercises Other Exercises Other Exercises: AROM digits L UE- elevated on pillows    General Comments General comments (skin integrity, edema, etc.): VSS      Pertinent Vitals/Pain Pain Assessment: 0-10 Pain Score: 10-Worst pain ever Faces Pain Scale: Hurts worst Pain Location: headache Pain Descriptors / Indicators: Discomfort Pain Intervention(s): Monitored during session    Home Living                      Prior Function            PT Goals (current goals can now be found in the care plan section) Acute Rehab PT Goals Patient Stated Goal: family requesting to d/c CIR at this time PT Goal Formulation: With patient/family Time For Goal Achievement: 07/31/20 Potential to Achieve Goals: Good    Frequency    Min 4X/week      PT Plan Current plan remains appropriate    Co-evaluation PT/OT/SLP Co-Evaluation/Treatment: Yes Reason for Co-Treatment: Complexity of the patient's impairments (multi-system involvement) PT goals addressed during session: Mobility/safety with mobility OT goals addressed during session: ADL's and self-care;Proper use of Adaptive equipment and DME;Strengthening/ROM      AM-PAC PT "6 Clicks" Mobility   Outcome Measure  Help needed turning from your back to your side while in a flat bed without using bedrails?: A Lot Help needed moving from lying on your back to sitting on the side of a flat bed without using bedrails?: A Lot Help needed moving to and from a bed to a chair (including a wheelchair)?: A Lot Help needed standing up from a chair using your arms (e.g., wheelchair or bedside chair)?: A Lot Help needed to walk in hospital room?: A Lot Help needed climbing 3-5 steps with a railing? : Total 6 Click Score: 11    End of Session Equipment Utilized During Treatment: Gait belt Activity Tolerance: Patient tolerated treatment well Patient left: in chair;with call bell/phone within reach;with chair alarm set;with  family/visitor present Nurse Communication: Mobility status PT Visit Diagnosis: Unsteadiness on feet (R26.81);Difficulty in walking, not elsewhere classified (R26.2)     Time: 2111-5520 PT Time Calculation (min) (ACUTE ONLY): 29 min  Charges:  $Gait Training: 8-22 mins                     Nancy Zamora, PT, DPT Acute Rehabilitation Services Pager #: [phone] Office #: [phone]    Nancy Zamora 07/20/2020, 2:22 PM

## 2020-07-20 NOTE — PMR Pre-admission (Shared)
PMR Admission Coordinator Pre-Admission Assessment  Patient: Nancy Zamora is an 87 y.o., female MRN: [id number] DOB: [birth year] Height:   Weight:                Insurance Information HMO: ***    PPO: ***     PCP: ***     IPA: ***     80/20: ***     OTHER: *** PRIMARY: ***      Policy#: ***      Subscriber: *** CM Name: ***      Phone#: ***     Fax#: *** Pre-Cert#: ***      Employer: *** Benefits:  Phone #: ***     Name: *** Eff. Date: ***     Deduct: ***      Out of Pocket Max: ***      Life Max: ***  CIR: ***      SNF: *** Outpatient: ***     Co-Pay: *** Home Health: ***      Co-Pay: *** DME: ***     Co-Pay: *** Providers: *** SECONDARY: ***      Policy#: ***      Phone#: ***  Financial Counselor: ***      Phone#: ***  The "Data Collection Information Summary" for patients in Inpatient Rehabilitation Facilities with attached "Privacy Act [place] Records" was provided and verbally reviewed with: {CHL IP Patient Family TW:[id number]  Emergency Contact Information Contact Information    Name Relation Home Work Austwell, Crellin Son   [id number]   Duane Lope Daughter   [phone]     Current Medical History  Patient Admitting Diagnosis: Right frontal traumatic intraparenchymal hemorrhage  History of Present Illness: Nancy Zamora is a 87 y.o. 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  Per chart review patient lives alone.  Two-level home bed and bath main level 3 steps to entry.  She has a 24-hour caregiver as well as family support.  She needed assistance for ADLs and mobility using a Rollator prior to admission.  Presented 07/16/2020 after a fall getting out of bed denied loss of consciousness.  Cranial CT scan showed acute extra-axial hemorrhage and acute intraparenchymal hemorrhage at the right inferior frontal lobe.  No acute fracture or  dislocation.  Admission chemistries unremarkable except creatinine 1.03.  Her Eliquis was held due to [facility] and she received Kcentra.  Follow-up cranial CT scan 07/16/2020 and 07/17/2020 showed marked progression from earlier study of right frontal parenchymal hematoma small adjacent subarachnoid hemorrhage local mass-effect in the right frontal lobe on the right frontal horn.  Chronic infarct right frontal lobe which shows a small amount of acute hemorrhage new from earlier comparison.  Follow-up neurosurgery Dr. Reatha Armour advised conservative care.  Therapy evaluations completed with recommendations for a physical medicine rehab consult. Patient has family as well as 24/7 hired caregivers since her stroke in 2020. Pt is to admit to CIR on ***.   Complete NIHSS TOTAL: 9 Glasgow Coma Scale Score: 14  Past Medical History  Past Medical History:  Diagnosis Date  . Atrial fibrillation with rapid ventricular response ([place])   . CVA (cerebral vascular accident) ([place])   . Dysphagia   . Hypertension   . Osteoporosis   . SVT (supraventricular tachycardia) ([facility])     Family History  family history is not on file.  Prior Rehab/Hospitalizations:  Has the patient had prior rehab or hospitalizations  prior to admission? No  Has the patient had major surgery during 100 days prior to admission? No  Current Medications   Current Facility-Administered Medications:  .  0.9 %  sodium chloride infusion, 250 mL, Intravenous, PRN, Dawley, Troy C, DO .  0.9 %  sodium chloride infusion, , Intravenous, Continuous, Meyran, Ocie Cornfield, NP, Last Rate: 50 mL/hr at 07/20/20 0600, Infusion Verify at 07/20/20 0600 .  acetaminophen (TYLENOL) tablet 650 mg, 650 mg, Oral, Q6H PRN, 650 mg at 07/19/20 1613 **OR** acetaminophen (TYLENOL) suppository 650 mg, 650 mg, Rectal, Q6H PRN, Dawley, Troy C, DO .  Chlorhexidine Gluconate Cloth 2 % PADS 6 each, 6 each, Topical, Daily, Dawley, Troy C, DO, 6 each at 07/19/20 1445 .   hydrALAZINE (APRESOLINE) injection 5 mg, 5 mg, Intravenous, Q4H PRN, Dawley, Troy C, DO, 5 mg at 07/18/20 0342 .  ipratropium (ATROVENT) 0.06 % nasal spray 2 spray, 2 spray, Each Nare, QID PRN, Meyran, Ocie Cornfield, NP .  levETIRAcetam (KEPPRA) tablet 500 mg, 500 mg, Oral, BID, Dawley, Troy C, DO, 500 mg at 07/19/20 2115 .  losartan (COZAAR) tablet 25 mg, 25 mg, Oral, QODAY, Meyran, Ocie Cornfield, NP .  MEDLINE mouth rinse, 15 mL, Mouth Rinse, BID, Erline Levine, MD, 15 mL at 07/19/20 2115 .  methocarbamol (ROBAXIN) 500 mg in dextrose 5 % 50 mL IVPB, 500 mg, Intravenous, Q6H PRN, Dawley, Troy C, DO .  metoprolol tartrate (LOPRESSOR) tablet 25 mg, 25 mg, Oral, Daily, Erline Levine, MD, 25 mg at 07/19/20 1019 .  ondansetron (ZOFRAN) injection 4 mg, 4 mg, Intravenous, Q6H PRN, Erline Levine, MD, 4 mg at 07/19/20 1746 .  oxyCODONE (Oxy IR/ROXICODONE) immediate release tablet 5 mg, 5 mg, Oral, Q6H PRN, Erline Levine, MD, 5 mg at 07/19/20 1746 .  polyethylene glycol (MIRALAX / GLYCOLAX) packet 17 g, 17 g, Oral, Daily, Meyran, Ocie Cornfield, NP, 17 g at 07/19/20 1020 .  sodium chloride flush (NS) 0.9 % injection 3 mL, 3 mL, Intravenous, Q12H, Dawley, Troy C, DO, 3 mL at 07/19/20 2114 .  sodium chloride flush (NS) 0.9 % injection 3 mL, 3 mL, Intravenous, PRN, Dawley, Troy C, DO  Patients Current Diet:  Diet Order            Diet regular Room service appropriate? Yes; Fluid consistency: Thin  Diet effective now                 Precautions / Restrictions Precautions Precautions: Fall Precaution Comments: residual L-sided weakness from prior stroke Restrictions Weight Bearing Restrictions: No Other Position/Activity Restrictions: pt with very limited ROM to L shoulder (due to pain)   Has the patient had 2 or more falls or a fall with injury in the past year?Yes  Prior Activity Level Limited Community (1-2x/wk): limited;no longer drove or worked; used Radiation protection practitioner for ambulation; up to [employer] A  for ADLs  Prior Functional Level Prior Function Level of Independence: Needs assistance Gait / Transfers Assistance Needed: used rollator ADL's / Homemaking Assistance Needed: assist from family/hired help  Self Care: Did the patient need help bathing, dressing, using the toilet or eating?  Needed some help  Indoor Mobility: Did the patient need assistance with walking from room to room (with or without device)? Needed some help  Stairs: Did the patient need assistance with internal or external stairs (with or without device)? Needed some help  Functional Cognition: Did the patient need help planning regular tasks such as shopping or remembering to take medications? Needed some  help  Home Assistive Devices / Equipment Home Assistive Devices/Equipment: Raised toilet seat with rails,Shower chair with back,Hearing aid,Walker (specify type) Armed forces training and education officer) Home Equipment: Walker - 2 wheels,Walker - 4 wheels,Shower seat,Hand held shower head  Prior Device Use: Indicate devices/aids used by the patient prior to current illness, exacerbation or injury? Rollator  Current Functional Level Cognition  Overall Cognitive Status: Impaired/Different from baseline Orientation Level: Oriented to person,Oriented to situation Safety/Judgement: Decreased awareness of safety,Decreased awareness of deficits General Comments: pt HOH and needs repeated cues/commands at times for that reason. Able to follow commands she does hear well. making conersation with son, decreased initiation, especially in regards to safety with movement    Extremity Assessment (includes Sensation/Coordination)  Upper Extremity Assessment: Defer to OT evaluation (pt with known L UE fracture however no-op) RUE Deficits / Details: arthritic deformity noted.  Shoulder flexion actively limited to ~80* (baseline) LUE Deficits / Details: Pt with h/o residual weakness from prior CVA as well as fx of shoulder (per son), resulting in limited  shoulder ROM to ~45* LUE Coordination: decreased gross motor,decreased fine motor  Lower Extremity Assessment: Generalized weakness (L weaker than R from previous stroke)    ADLs  Overall ADL's : Needs assistance/impaired Eating/Feeding: Moderate assistance,Sitting Eating/Feeding Details (indicate cue type and reason): Pt with poor appetite Grooming: Wash/dry hands,Wash/dry face,Brushing hair,Minimal assistance,Sitting Grooming Details (indicate cue type and reason): assist for thoroughness and perseveration noted Upper Body Bathing: Moderate assistance,Sitting Lower Body Bathing: Maximal assistance,Sit to/from stand Upper Body Dressing : Total assistance,Sitting Lower Body Dressing: Total assistance,Sit to/from stand Toilet Transfer: Moderate assistance,+2 for physical assistance,+2 for safety/equipment,Stand-pivot,BSC Toileting- Clothing Manipulation and Hygiene: Total assistance,+2 for physical assistance,+2 for safety/equipment,Sit to/from stand Functional mobility during ADLs: Moderate assistance,+2 for physical assistance,+2 for safety/equipment    Mobility  Overal bed mobility: Needs Assistance Bed Mobility: Supine to Sit Supine to sit: Mod assist,+2 for physical assistance General bed mobility comments: pt sitting up in recliner at start and end of session    Transfers  Overall transfer level: Needs assistance Equipment used: 2 person hand held assist Transfers: Sit to/from Stand Sit to Stand: Mod assist,+2 physical assistance,+2 safety/equipment Stand pivot transfers: Mod assist,+2 physical assistance,+2 safety/equipment General transfer comment: strong posterior lean, modA to power up and steady    Ambulation / Gait / Stairs / Wheelchair Mobility  Ambulation/Gait Ambulation/Gait assistance: Mod assist,+2 physical assistance,+2 safety/equipment Gait Distance (Feet): 8 Feet Assistive device: 2 person hand held assist Gait Pattern/deviations: Decreased stride  length,Shuffle,Narrow base of support,Step-to pattern,Leaning posteriorly General Gait Details: leaning posteriorly with initial stand, less severe with gait, but still with some lean. requires modA of 2 and benefits from chair follow for safety. no assist to advance legs but minimal clearance Gait velocity: slow Gait velocity interpretation: 1.31 - 2.62 ft/sec, indicative of limited community ambulator    Posture / Balance Dynamic Sitting Balance Sitting balance - Comments: pt requiring minG to pull forward and sit unsupported Balance Overall balance assessment: Needs assistance Sitting-balance support: Feet supported,No upper extremity supported Sitting balance-Leahy Scale: Poor Sitting balance - Comments: pt requiring minG to pull forward and sit unsupported Postural control: Posterior lean Standing balance support: Single extremity supported Standing balance-Leahy Scale: Poor Standing balance comment: UE support and modA of 2 to maintain static and dynamic stance.    Special needs/care consideration Continuous Drip IV: 0.9% sodium chloride infusion, , Skin: abrasion to left; right (ankle, elbow, leg); ecchymosis to right eye; skin tear to left lower arm.  Previous Home Environment (from acute therapy documentation) Living Arrangements: Alone (has 24/7) Available Help at Discharge: Family,Personal care attendant,Available 24 hours/day Type of Home: House Home Layout: Two level,Able to live on main level with bedroom/bathroom Home Access: Stairs to enter Entrance Stairs-Rails: Right,Left Entrance Stairs-Number of Steps: 3 Bathroom Shower/Tub: Tub/shower unit (with mechanical lift) Bathroom Toilet: Standard Bathroom Accessibility: Yes Home Care Services: Yes Additional Comments: Pt has 24 hour care from family as well as hired caregivers  Discharge Living Setting Plans for Discharge Living Setting: Patient's home,Other (Comment) (condo) Type of Home at Discharge:  House Discharge Home Layout: Able to live on main level with bedroom/bathroom Discharge Home Access: Stairs to enter Entrance Stairs-Rails: Right,Left Entrance Stairs-Number of Steps: 4 Discharge Bathroom Shower/Tub: Tub/shower unit Discharge Bathroom Toilet: Handicapped height Discharge Bathroom Accessibility: Yes How Accessible: Accessible via walker Does the patient have any problems obtaining your medications?: No  Social/Family/Support Systems Patient Roles: Other (Comment) Contact Information: has hired caregivers (24/7) Anticipated Caregiver: hired assist (already in place) Nurse, adult Information: daughter is self reportedly POA: Jasmine December [phone] Ability/Limitations of Caregiver: Min/Mod A for hired caregivers Caregiver Availability: 24/7 Discharge Plan Discussed with Primary Caregiver: Yes (with pt and daughter) Is Caregiver In Agreement with Plan?: Yes Does Caregiver/Family have Issues with Lodging/Transportation while Pt is in Rehab?: No   Goals Patient/Family Goal for Rehab: PT/PT: Min/Mod A; SLP: Supervision  Expected length of stay: 10-14 days Pt/Family Agrees to Admission and willing to participate: Yes Program Orientation Provided & Reviewed with Pt/Caregiver Including Roles  & Responsibilities: Yes  Barriers to Discharge: Home environment access/layout  Barriers to Discharge Comments: stairs to enter home   Decrease burden of Care through IP rehab admission: OtherNA; Goal is to reduce caregiver burden from a 2 person assist to a 1 person assist.    Possible need for SNF placement upon discharge: Not anticipated; pt already has caregiver support in place that can provide anticipated goal level assist (min/mod A). Pt has supportive family.    Patient Condition: {PATIENT'S CONDITION:22832}  Preadmission Screen Completed By:  Cheri Rous, OT, 07/20/2020 7:13 AM ______________________________________________________________________    Discussed status with Dr. Marland Kitchenon***at *** and received approval for admission today.  Admission Coordinator:  Cheri Rous, time***/Date***

## 2020-07-20 NOTE — Progress Notes (Signed)
   Providing Compassionate, Quality Care - Together  NEUROSURGERY PROGRESS NOTE   S: No issues overnight.  Complains of a headache this a.m., no other complaints  O: EXAM:  BP (!) 156/81   Pulse (!) 104   Temp 98.2 F (36.8 C) (Oral)   Resp 17   SpO2 97%   Awake, alert, oriented x2 Speech fluent, appropriate  CNs grossly intact  PERRLA EOMI Right periorbital ecchymoses, stable Moves all extremities, chronic left-sided weakness from previous stroke  ASSESSMENT:  87 y.o. female with  1.  Right frontal traumatic intraparenchymal hemorrhage  PLAN: -Stepdown status -Pain control -Rehab eval -DVT prophylaxis -Keppra for antiepileptics -DC planning to rehab when bed available    Thank you for allowing me to participate in this patient's care.  Please do not hesitate to call with questions or concerns.   Monia Pouch, DO Neurosurgeon [facility] Cell: [phone]

## 2020-07-20 NOTE — Progress Notes (Signed)
Inpatient Rehab Admissions Coordinator:   Met with pt. And daughter at bedside to discuss potential CIR admission. Answered daughter's questions. She would like to talk with other families before deciding on CIR vs SNF. I do not have a bed available for pt. Today and Pt. Is not yet medically ready for CIR. Will continue to follow for potential admit pending family's decision, bed availability, and insurance auth.   Clemens Catholic, [place], [place] Admissions Coordinator  [phone] ([place]) [phone] (office)

## 2020-07-20 NOTE — Progress Notes (Signed)
Occupational Therapy Treatment Patient Details Name: Nancy Zamora MRN: [id number] DOB: 1931/02/22 Today's Date: 07/20/2020    History of present illness This 87 y.o. female admitted after sustaining a fall.  CT of head showed intraparenchymal hemorrhage Rt inferior frontal lobe.  Repeat CT scan showed marked progression of the bleed and underwent reversal of Eliquis.  PMH includes:  h/o CVA with residual Lt sided weakness, SVT, osteoporosis, HTN, AFib with rapid ventricular response   OT comments  Pt currently with strong posterior lean with transfers. Pt powers up but immediately starts with posterior bias causing transfer to be total +2 max (A). Pt requires (A) with positioning the RW during transfers. Pt denies need to void and when placed on 3n1 over commode immediately voids. Recommendation for CIR at this time.    Follow Up Recommendations  CIR    Equipment Recommendations  3 in 1 bedside commode;Other (comment) (RW)    Recommendations for Other Services Rehab consult    Precautions / Restrictions Precautions Precautions: Fall Precaution Comments: residual L-sided weakness from prior stroke/ hx L shoulder fx with pending workup outpatient per family       Mobility Bed Mobility               General bed mobility comments: oob in chair on arrival  Transfers Overall transfer level: Needs assistance Equipment used: 2 person hand held assist Transfers: Sit to/from Stand Sit to Stand: +2 physical assistance;+2 safety/equipment;Max assist Stand pivot transfers: +2 physical assistance;Mod assist       General transfer comment: requires (A) to place BIL UE on RW    Balance Overall balance assessment: Needs assistance Sitting-balance support: Bilateral upper extremity supported;Feet supported Sitting balance-Leahy Scale: Poor     Standing balance support: Bilateral upper extremity supported;During functional activity Standing balance-Leahy Scale: Poor Standing  balance comment: reliant on therapist and posterior bias. therapist has to help pt reach with bil LE for RW                           ADL either performed or assessed with clinical judgement   ADL Overall ADL's : Needs assistance/impaired     Grooming: Oral care;Wash/dry face;Minimal assistance;Sitting Grooming Details (indicate cue type and reason): pt needs cues to initiate but able to brush teeth in sitting. pt coudl benefit from a battery operated tooth bruth to help with detail to task. pt with neck flexion to help R arm reach face for eyes     Lower Body Bathing: Total assistance       Lower Body Dressing: Maximal assistance Lower Body Dressing Details (indicate cue type and reason): don doff pull ups Toilet Transfer: Moderate assistance;RW;+2 for physical assistance;+2 for safety/equipment   Toileting- Clothing Manipulation and Hygiene: Total assistance;+2 for physical assistance;+2 for safety/equipment;Sit to/from stand       Functional mobility during ADLs: Moderate assistance;+2 for physical assistance;+2 for safety/equipment General ADL Comments: pt premedicated but reports ha remains 10/10.     Vision       Perception     Praxis      Cognition Arousal/Alertness: Awake/alert Behavior During Therapy: WFL for tasks assessed/performed Overall Cognitive Status: Impaired/Different from baseline Area of Impairment: Memory;Safety/judgement;Problem solving;Orientation;Awareness                 Orientation Level: Disoriented to;Place;Time;Situation (requesting granddaughter by name)   Memory: Decreased short-term memory   Safety/Judgement: Decreased awareness of safety;Decreased awareness of deficits Awareness: Intellectual Problem  Solving: Difficulty sequencing;Requires verbal cues;Requires tactile cues General Comments: HOH and following 1 step commands. pt asking for granddaughter that normally sits with her daily byname. daughter telling  patient she is not at hospital an dpt wants to know WhY        Exercises Other Exercises Other Exercises: AROM digits L UE- elevated on pillows   Shoulder Instructions       General Comments VSS    Pertinent Vitals/ Pain       Pain Assessment: Faces Faces Pain Scale: Hurts worst Pain Location: headache Pain Descriptors / Indicators: Discomfort Pain Intervention(s): Monitored during session;Premedicated before session;Repositioned  Home Living                                          Prior Functioning/Environment              Frequency  Min 2X/week        Progress Toward Goals  OT Goals(current goals can now be found in the care plan section)  Progress towards OT goals: Progressing toward goals  Acute Rehab OT Goals Patient Stated Goal: family requesting to d/c CIR at this time OT Goal Formulation: With patient/family Time For Goal Achievement: 07/31/20 Potential to Achieve Goals: Good ADL Goals Pt Will Perform Eating: with set-up;with supervision;sitting Pt Will Perform Grooming: with min assist;standing Pt Will Transfer to Toilet: with min assist;ambulating;regular height toilet;bedside commode;grab bars Pt Will Perform Toileting - Clothing Manipulation and hygiene: with max assist;sit to/from stand  Plan Discharge plan remains appropriate    Co-evaluation    PT/OT/SLP Co-Evaluation/Treatment: Yes Reason for Co-Treatment: Complexity of the patient's impairments (multi-system involvement);Necessary to address cognition/behavior during functional activity;For patient/therapist safety;To address functional/ADL transfers   OT goals addressed during session: ADL's and self-care;Proper use of Adaptive equipment and DME;Strengthening/ROM      AM-PAC OT "6 Clicks" Daily Activity     Outcome Measure   Help from another person eating meals?: A Lot Help from another person taking care of personal grooming?: A Little Help from another person  toileting, which includes using toliet, bedpan, or urinal?: A Lot Help from another person bathing (including washing, rinsing, drying)?: A Lot Help from another person to put on and taking off regular upper body clothing?: Total Help from another person to put on and taking off regular lower body clothing?: Total 6 Click Score: 11    End of Session Equipment Utilized During Treatment: Rolling walker;Gait belt  OT Visit Diagnosis: Unsteadiness on feet (R26.81);Cognitive communication deficit (R41.841)   Activity Tolerance Patient tolerated treatment well   Patient Left in chair;with call bell/phone within reach;with chair alarm set   Nurse Communication Mobility status;Precautions        Time: 6387-5643 OT Time Calculation (min): 26 min  Charges: OT General Charges $OT Visit: 1 Visit OT Treatments $Self Care/Home Management : 8-22 mins   Brynn, OTR/L  Acute Rehabilitation Services Pager: [phone] Office: [phone] .    Mateo Flow 07/20/2020, 12:12 PM

## 2020-07-21 MED ORDER — BUTALBITAL-APAP-CAFFEINE 50-325-40 MG PO TABS
1.0000 | ORAL_TABLET | ORAL | Status: DC | PRN
  Administered 2020-07-21 – 2020-07-28 (×7): 1 via ORAL
  Filled 2020-07-21 (×8): qty 1

## 2020-07-21 NOTE — Progress Notes (Signed)
Physical Therapy Treatment Patient Details Name: Nancy Zamora MRN: [id number] DOB: 06-13-1931 Today's Date: 07/21/2020    History of Present Illness This 87 y.o. female admitted after sustaining a fall.  CT of head showed intraparenchymal hemorrhage Rt inferior frontal lobe.  Repeat CT scan showed marked progression of the bleed and underwent reversal of Eliquis.  PMH includes:  h/o CVA with residual Lt sided weakness, SVT, osteoporosis, HTN, AFib with rapid ventricular response    PT Comments    Pt eager to "work" today, and demonstrates more awareness of deficits vs previous sessions. Pt ambulatory in hallway with use of RW and mod PT assist for trunk control, at times step-by-step sequencing, and managing RW. PT implemented repeated sit to stands with pt to work on preventing posterior bias when rising and sitting, pt tolerated well. PT spoke with pt and family about CIR vs SNF, and both pt and family agree pt is eager to d/c to CIR. Pt is a very Scientist, research (physical sciences) and wants to be as independent as possible once d/c home.    Follow Up Recommendations  CIR     Equipment Recommendations   (defer to post acute)    Recommendations for Other Services       Precautions / Restrictions Precautions Precautions: Fall Precaution Comments: residual L-sided weakness from prior stroke/ hx L shoulder fx with pending workup outpatient per family Restrictions Other Position/Activity Restrictions: pt with very limited ROM to L shoulder (due to pain)    Mobility  Bed Mobility Overal bed mobility: Needs Assistance             General bed mobility comments: sitting EOB finishing lunch upon PT arrival  Transfers Overall transfer level: Needs assistance Equipment used: Rolling walker (2 wheeled) Transfers: Sit to/from Stand Sit to Stand: Max assist;+2 safety/equipment         General transfer comment: Max +2 initially for rise, steady, and correcting severe posterior bias via truncal tactile  input, evolving to min assist to stand with repeated sit<>stand practice with hip flexor cues during stand>sit and trunk cues during sit>stand.  Ambulation/Gait Ambulation/Gait assistance: Mod assist;+2 safety/equipment Gait Distance (Feet): 55 Feet Assistive device: Rolling walker (2 wheeled) Gait Pattern/deviations: Decreased stride length;Shuffle;Narrow base of support;Leaning posteriorly;Step-through pattern Gait velocity: decr   General Gait Details: Mod +2 for keeping trunk from hyperextending, steadying, managing RW, placing LUE on RW intermittently throughout session. Verbal cuing for bigger steps, lifting head to look forward and not downward, widening BOS.   Stairs             Wheelchair Mobility    Modified Rankin (Stroke Patients Only) Modified Rankin (Stroke Patients Only) Pre-Morbid Rankin Score: Moderate disability Modified Rankin: Moderately severe disability     Balance Overall balance assessment: Needs assistance Sitting-balance support: Bilateral upper extremity supported;Feet supported Sitting balance-Leahy Scale: Fair Sitting balance - Comments: able to sit EOB unsupported Postural control: Posterior lean Standing balance support: Bilateral upper extremity supported;During functional activity Standing balance-Leahy Scale: Poor Standing balance comment: reliant on PT assist to steady and correct posterior bias                            Cognition Arousal/Alertness: Awake/alert Behavior During Therapy: WFL for tasks assessed/performed Overall Cognitive Status: Impaired/Different from baseline Area of Impairment: Following commands;Safety/judgement;Problem solving;Awareness                       Following Commands:  Follows one step commands with increased time Safety/Judgement: Decreased awareness of safety Awareness: Emergent Problem Solving: Difficulty sequencing;Requires verbal cues;Requires tactile cues General Comments: Pt  very pleasant and motivated, requires step-by-step cuing and frequent repeated cues for safe mobility. Pt with good understanding of why she fell, states "I was getting up by myself and I am not supposed to".      Exercises General Exercises - Lower Extremity Mini-Sqauts: AROM;Both;5 reps;Seated;Standing (sit to stands from recliner, emphasis on sitting and standing mechanics given pt truncal posterior bias)    General Comments        Pertinent Vitals/Pain Pain Assessment: 0-10 Pain Score: 7  Pain Location: headache Pain Descriptors / Indicators: Discomfort Pain Intervention(s): Limited activity within patient's tolerance;Monitored during session;Repositioned;Premedicated before session    Home Living                      Prior Function            PT Goals (current goals can now be found in the care plan section) Acute Rehab PT Goals Patient Stated Goal: CIR PT Goal Formulation: With patient/family Time For Goal Achievement: 07/31/20 Potential to Achieve Goals: Good Progress towards PT goals: Progressing toward goals    Frequency    Min 4X/week      PT Plan Current plan remains appropriate    Co-evaluation              AM-PAC PT "6 Clicks" Mobility   Outcome Measure  Help needed turning from your back to your side while in a flat bed without using bedrails?: A Little Help needed moving from lying on your back to sitting on the side of a flat bed without using bedrails?: A Lot Help needed moving to and from a bed to a chair (including a wheelchair)?: A Lot Help needed standing up from a chair using your arms (e.g., wheelchair or bedside chair)?: A Lot Help needed to walk in hospital room?: A Lot Help needed climbing 3-5 steps with a railing? : Total 6 Click Score: 12    End of Session Equipment Utilized During Treatment: Gait belt Activity Tolerance: Patient tolerated treatment well Patient left: in chair;with call bell/phone within reach;with  chair alarm set;with family/visitor present Nurse Communication: Mobility status PT Visit Diagnosis: Unsteadiness on feet (R26.81);Difficulty in walking, not elsewhere classified (R26.2)     Time: 6606-3016 PT Time Calculation (min) (ACUTE ONLY): 28 min  Charges:  $Gait Training: 8-22 mins $Therapeutic Activity: 8-22 mins                     Marye Round, PT Acute Rehabilitation Services Pager [phone]  Office [phone]    Truddie Coco 07/21/2020, 4:47 PM

## 2020-07-21 NOTE — Progress Notes (Addendum)
Inpatient Rehabilitation-Admissions Coordinator   Attempted to follow up with pt's daughter, Jasmine December, for final decision regarding CIR vs SNF. Left voicemail and await call back.   Cheri Rous, OTR/L  Rehab Admissions Coordinator  [phone] 07/21/2020 12:40 PM   Addendum 1:49PM: spoke to pt and her son Onalee Hua in the room. They want to pursue CIR at this time. AC will begin insurance auth request for possible admit.   Cheri Rous, OTR/L  Rehab Admissions Coordinator  [phone] 07/21/2020 1:50 PM

## 2020-07-21 NOTE — Progress Notes (Signed)
   Providing Compassionate, Quality Care - Together  NEUROSURGERY PROGRESS NOTE   S: No issues overnight. Persistent HA despite steroids, d/w son at bedside, patient drinks caffeine daily and has not had any recently  O: EXAM:  BP (!) 171/67 (BP Location: Right Arm)   Pulse 73   Temp 98 F (36.7 C) (Oral)   Resp 20   SpO2 98%   Awake, alert, oriented x3 Speech fluent, appropriate  CNs grossly intact  MAE, chronic L sided weakness  ASSESSMENT:  87 y.o. female with  1. R Traumatic IPH  PLAN: - supportive care - fioricet for headaches, advised ordering coffee daily - pt/ot, cir pending, once HA controlled, can dc    Thank you for allowing me to participate in this patient's care.  Please do not hesitate to call with questions or concerns.   Monia Pouch, DO Neurosurgeon [facility] Cell: [phone]

## 2020-07-22 NOTE — Progress Notes (Signed)
   Providing Compassionate, Quality Care - Together  NEUROSURGERY PROGRESS NOTE   S: No issues overnight. HA improved  O: EXAM:  BP (!) 148/67 (BP Location: Right Arm)   Pulse 70   Temp 97.6 F (36.4 C) (Oral)   Resp 16   SpO2 99%   Awake, alert, oriented x3 Speech fluent, appropriate  CNs grossly intact  MAE, chronic L sided weakness  ASSESSMENT:  87 y.o. female with  1. R Traumatic IPH  PLAN: - supportive care - fioricet for headaches, advised ordering coffee daily - pt/ot, cir pending, HA controlled, can dc once bed available -hold anticoag, SQH ok for dvt ppx     Thank you for allowing me to participate in this patient's care.  Please do not hesitate to call with questions or concerns.   Monia Pouch, DO Neurosurgeon [facility] Cell: [phone]

## 2020-07-22 NOTE — Care Management Important Message (Signed)
Important Message  Patient Details  Name: Nancy Zamora MRN: [id number] Date of Birth: Apr 04, 1931   Medicare Important Message Given:  Yes     Charman Blasco 07/22/2020, 3:09 PM

## 2020-07-22 NOTE — TOC Progression Note (Addendum)
Transition of Care [facility]) - Progression Note    Patient Details  Name: Nancy Zamora MRN: [id number] Date of Birth: Jul 10, 1931  [facility]) CM/SW Contact  Carmina Miller, LCSWA Phone Number: 07/22/2020, 1:54 PM  Clinical Narrative:    2:43-CSW spoke with Nancy Zamora, states he would be interested in: [place], [address], or [facility]. Provided [email or web address] website and insurance auth information. Referrals sent out on HUB.  CSW reached out to pt's son, Nancy Zamora, to speak about back-up options in case CIR appeal is upheld, had to leave a vm.         Expected Discharge Plan and Services                                                 Social Determinants of Health (SDOH) Interventions    Readmission Risk Interventions No flowsheet data found.

## 2020-07-22 NOTE — Progress Notes (Addendum)
Inpatient Rehabilitation-Admissions Coordinator   Received notification from pt's insurance that her request for CIR has been denied; they did offer a peer to peer request in attempt to overturn the decision. I reached out to the PM&R MD, Dr. Wynn Banker, who completed the consult on 1/3 to see if he is able to complete the peer to peer discussion today. Will follow up once I have more information.   Cheri Rous, OTR/L  Rehab Admissions Coordinator  [phone] 07/22/2020 9:55 AM  Addendum: Dr Jake Samples will attempt peer to peer conference today.  Will update once there has been a determination.   Cheri Rous, OTR/L  Rehab Admissions Coordinator  [phone] 07/22/2020 1:51 PM

## 2020-07-22 NOTE — Progress Notes (Signed)
Physical Therapy Treatment Patient Details Name: Nancy Zamora MRN: [id number] DOB: Nov 29, 1930 Today's Date: 07/22/2020    History of Present Illness This 87 y.o. female admitted after sustaining a fall.  CT of head showed intraparenchymal hemorrhage Rt inferior frontal lobe.  Repeat CT scan showed marked progression of the bleed and underwent reversal of Eliquis.  PMH includes:  h/o CVA with residual Lt sided weakness, SVT, osteoporosis, HTN, AFib with rapid ventricular response    PT Comments    The pt was able to make good progress with mobility and PT goals at this time. She was able to progress hallway ambulation, and complete multiple sit-stand transfers in the room with focus on repeated practice of increased forward lean/reach to reduce posterior lean upon standing. The pt was also able to complete short bout of gait in the room, but benefits from increased assist to manage in tight spaces, will continue to benefit from skilled PT to progress power, strength, ROM, and stability to reduce risk of falls and decrease assist needed for mobility and gait. Continue to recommend CIR.     Follow Up Recommendations  CIR     Equipment Recommendations   (defer to post acute)    Recommendations for Other Services       Precautions / Restrictions Precautions Precautions: Fall Precaution Comments: residual L-sided weakness from prior stroke/ hx L shoulder fx with pending workup outpatient per family Restrictions Weight Bearing Restrictions: No Other Position/Activity Restrictions: pt with very limited ROM to L shoulder (due to pain/weakness)    Mobility  Bed Mobility Overal bed mobility: Needs Assistance Bed Mobility: Supine to Sit     Supine to sit: Mod assist     General bed mobility comments: significantly increased time, veerbal cues for each movement  Transfers Overall transfer level: Needs assistance Equipment used: Rolling walker (2 wheeled) Transfers: Sit to/from  Stand Sit to Stand: Max assist;Mod assist         General transfer comment: significant fluccuation during session  Ambulation/Gait Ambulation/Gait assistance: Mod assist (chair follow) Gait Distance (Feet): 70 Feet Assistive device: Rolling walker (2 wheeled) Gait Pattern/deviations: Decreased stride length;Shuffle;Narrow base of support;Leaning posteriorly;Step-through pattern Gait velocity: decr Gait velocity interpretation: <1.31 ft/sec, indicative of household ambulator General Gait Details: modA with significant assist to maintain upright due to posterior lean, but with slight improvement with continued gait. slowed with significant distractions while walking resulting in many standing stops, can continue walking with repeated cues   Modified Rankin (Stroke Patients Only) Modified Rankin (Stroke Patients Only) Pre-Morbid Rankin Score: Moderate disability Modified Rankin: Moderately severe disability     Balance Overall balance assessment: Needs assistance Sitting-balance support: Bilateral upper extremity supported;Feet supported Sitting balance-Leahy Scale: Fair Sitting balance - Comments: able to sit EOB unsupported Postural control: Posterior lean Standing balance support: Bilateral upper extremity supported;During functional activity Standing balance-Leahy Scale: Poor Standing balance comment: reliant on PT assist to steady and correct posterior bias                            Cognition Arousal/Alertness: Awake/alert Behavior During Therapy: WFL for tasks assessed/performed Overall Cognitive Status: Impaired/Different from baseline Area of Impairment: Following commands;Safety/judgement;Problem solving;Awareness                 Orientation Level: Disoriented to;Place;Time;Situation   Memory: Decreased short-term memory Following Commands: Follows one step commands with increased time Safety/Judgement: Decreased awareness of safety Awareness:  Emergent Problem Solving: Difficulty sequencing;Requires  verbal cues;Requires tactile cues General Comments: pt very pleasant and motivated, requires step-by step cues for technique and safety. able to report that "everyone says I am at the hospital still" but states she thinks she is at her son's house and that multiple staff around have "come to visit from the hospital"      Exercises Other Exercises Other Exercises: sit-stand with emphasis on forward reach/lean x15 from [place] comments (skin integrity, edema, etc.): VSS on RA      Pertinent Vitals/Pain Pain Assessment: Faces Faces Pain Scale: Hurts little more Pain Location: generalized, L shoulder Pain Descriptors / Indicators: Discomfort Pain Intervention(s): Limited activity within patient's tolerance;Monitored during session;Repositioned           PT Goals (current goals can now be found in the care plan section) Acute Rehab PT Goals Patient Stated Goal: CIR PT Goal Formulation: With patient/family Time For Goal Achievement: 07/31/20 Potential to Achieve Goals: Good Progress towards PT goals: Progressing toward goals    Frequency    Min 4X/week      PT Plan Current plan remains appropriate       AM-PAC PT "6 Clicks" Mobility   Outcome Measure  Help needed turning from your back to your side while in a flat bed without using bedrails?: A Little Help needed moving from lying on your back to sitting on the side of a flat bed without using bedrails?: A Lot Help needed moving to and from a bed to a chair (including a wheelchair)?: A Lot Help needed standing up from a chair using your arms (e.g., wheelchair or bedside chair)?: A Lot Help needed to walk in hospital room?: A Lot Help needed climbing 3-5 steps with a railing? : Total 6 Click Score: 12    End of Session Equipment Utilized During Treatment: Gait belt Activity Tolerance: Patient tolerated treatment well Patient left:  in chair;with call bell/phone within reach;with chair alarm set;with family/visitor present Nurse Communication: Mobility status PT Visit Diagnosis: Unsteadiness on feet (R26.81);Difficulty in walking, not elsewhere classified (R26.2)     Time: [id number] PT Time Calculation (min) (ACUTE ONLY): 46 min  Charges:  $Gait Training: 23-37 mins $Therapeutic Activity: 8-22 mins                     Karma Ganja, PT, DPT   Acute Rehabilitation Department Pager #: [phone]   Otho Bellows 07/22/2020, 10:30 AM

## 2020-07-23 NOTE — TOC Progression Note (Addendum)
Transition of Care [facility]) - Progression Note    Patient Details  Name: Nancy Zamora MRN: [id number] Date of Birth: 08/04/1930  Transition of Care [facility]) CM/SW [place], [place] Phone Number: 07/23/2020, 3:56 PM  Clinical Narrative:    4:57: [place] can take pt on Monday, family made aware, would like to see if [facility] has an opening on Monday. Pt is vaccinated and boosted.   CSW spoke with pt's daughter, Ivin Booty, who stated she is really interested in [place] and spoke to Stella who stated there may be a bed available on Monday. When CSW spoke with [facility], she stated middle to later on next week. Per Secundino Ginger would like a new referral to be sent on Monday. CSW also spoke with Lexine Baton at [facility], who states no beds available this weekend but there may be openings next week. Lexine Baton stated she would review pt's referral. Loree Fee states she will follow up with CSW on Monday.         Expected Discharge Plan and Services                                                 Social Determinants of Health (SDOH) Interventions    Readmission Risk Interventions No flowsheet data found.

## 2020-07-23 NOTE — Progress Notes (Signed)
Physical Therapy Treatment Patient Details Name: Nancy Zamora MRN: [id number] DOB: 1931-07-02 Today's Date: 07/23/2020    History of Present Illness This 87 y.o. female admitted after sustaining a fall.  CT of head showed intraparenchymal hemorrhage Rt inferior frontal lobe.  Repeat CT scan showed marked progression of the bleed and underwent reversal of Eliquis.  PMH includes:  h/o CVA with residual Lt sided weakness, SVT, osteoporosis, HTN, AFib with rapid ventricular response    PT Comments    The pt was eager to participate in PT session, and was able to demo good progress with mobility and PT goals. The pt was able to increase gait distance at this time, and progressed from maxA initially (due to strong posterior lean) to minA with use of RW and verbal cues for positioning/proximity to RW. The pt also improved consistency and width of steps with continued gait, with fewer instances of scissoring by end of hallway ambulation. The pt was then able to practice multiple sit-stand transfers with focus on anterior lean and neutral wt shift in standing as pt still is most limited by strong posterior lean requiring maxA upon initial stand. Although I still feel the pt would benefit from CIR level therapies, due to denial of insurance, I feel the pt will need continued rehab in in-patient setting prior to return home with family for assist.     Follow Up Recommendations  SNF (pt's insurance denied CIR)     Equipment Recommendations   (defer to post acute)    Recommendations for Other Services       Precautions / Restrictions Precautions Precautions: Fall Precaution Comments: residual L-sided weakness from prior stroke/ hx L shoulder fx with pending workup outpatient per family Restrictions Weight Bearing Restrictions: No Other Position/Activity Restrictions: pt with very limited ROM to L shoulder (due to pain/weakness)    Mobility  Bed Mobility Overal bed mobility: Needs Assistance              General bed mobility comments: pt recieved and left up in recliner  Transfers Overall transfer level: Needs assistance Equipment used: Rolling walker (2 wheeled) Transfers: Sit to/from Stand Sit to Stand: Max assist;Mod assist         General transfer comment: pt completed x15 through session. x3 with mod/maxA and use of RW, attempting to improve pt hip extension and forward stance, pt with very strong posterior lean. minA wth verbal cues when pt in front of bed rail and cued to lean forward to reach for rail to stand  Ambulation/Gait Ambulation/Gait assistance: Max assist;Mod assist Gait Distance (Feet): 150 Feet Assistive device: Rolling walker (2 wheeled) Gait Pattern/deviations: Decreased stride length;Shuffle;Narrow base of support;Leaning posteriorly;Step-through pattern Gait velocity: decr Gait velocity interpretation: <1.31 ft/sec, indicative of household ambulator General Gait Details: initially maxA with strong posterior lean and narrow BOS with frequent scissoring. Pt able to progress to mod/minA with continued mobility. requires cues for stepping closer to RW      Modified Rankin (Stroke Patients Only) Modified Rankin (Stroke Patients Only) Pre-Morbid Rankin Score: Moderate disability Modified Rankin: Moderately severe disability     Balance Overall balance assessment: Needs assistance Sitting-balance support: Bilateral upper extremity supported;Feet supported Sitting balance-Leahy Scale: Fair Sitting balance - Comments: able to sit EOB unsupported Postural control: Posterior lean Standing balance support: Bilateral upper extremity supported;During functional activity Standing balance-Leahy Scale: Poor Standing balance comment: reliant on external assist d/t posterior lean  Cognition Arousal/Alertness: Awake/alert Behavior During Therapy: WFL for tasks assessed/performed;Flat affect Overall Cognitive Status:  Impaired/Different from baseline Area of Impairment: Safety/judgement;Awareness;Problem solving                         Safety/Judgement: Decreased awareness of safety;Decreased awareness of deficits Awareness: Intellectual Problem Solving: Slow processing;Decreased initiation;Requires tactile cues General Comments: pt very pleasant during session, continues to state she feels she is at home even though staff members tell her she is at [facility]. Pt is able to follow commands and instructions generally, but struggles to implement adjustements in regards to initial sit-stand transfers despite attempts and max multimodal cues. flat affect but appropriate      Exercises Other Exercises Other Exercises: sit-stand with reach for bed rail placed in front to reinforce forward lean and momentum to stanf    General Comments General comments (skin integrity, edema, etc.): VSS on RA      Pertinent Vitals/Pain Pain Assessment: Faces Faces Pain Scale: Hurts a little bit Pain Location: generalized, L shoulder Pain Descriptors / Indicators: Discomfort;Grimacing Pain Intervention(s): Limited activity within patient's tolerance;Monitored during session;Repositioned           PT Goals (current goals can now be found in the care plan section) Acute Rehab PT Goals Patient Stated Goal: CIR PT Goal Formulation: With patient/family Time For Goal Achievement: 07/31/20 Potential to Achieve Goals: Good Progress towards PT goals: Progressing toward goals    Frequency    Min 4X/week      PT Plan Discharge plan needs to be updated       AM-PAC PT "6 Clicks" Mobility   Outcome Measure  Help needed turning from your back to your side while in a flat bed without using bedrails?: A Little Help needed moving from lying on your back to sitting on the side of a flat bed without using bedrails?: A Lot Help needed moving to and from a bed to a chair (including a wheelchair)?: A Lot Help  needed standing up from a chair using your arms (e.g., wheelchair or bedside chair)?: A Lot Help needed to walk in hospital room?: A Lot Help needed climbing 3-5 steps with a railing? : Total 6 Click Score: 12    End of Session Equipment Utilized During Treatment: Gait belt Activity Tolerance: Patient tolerated treatment well Patient left: in chair;with call bell/phone within reach;with chair alarm set;with family/visitor present Nurse Communication: Mobility status PT Visit Diagnosis: Unsteadiness on feet (R26.81);Difficulty in walking, not elsewhere classified (R26.2)     Time: 9629-5284 PT Time Calculation (min) (ACUTE ONLY): 30 min  Charges:  $Gait Training: 8-22 mins $Therapeutic Exercise: 8-22 mins                     Karma Ganja, PT, DPT   Acute Rehabilitation Department Pager #: [phone]   Otho Bellows 07/23/2020, 5:51 PM

## 2020-07-23 NOTE — Progress Notes (Signed)
Occupational Therapy Treatment Patient Details Name: Nancy Zamora MRN: [id number] DOB: January 07, 1931 Today's Date: 07/23/2020    History of present illness This 87 y.o. female admitted after sustaining a fall.  CT of head showed intraparenchymal hemorrhage Rt inferior frontal lobe.  Repeat CT scan showed marked progression of the bleed and underwent reversal of Eliquis.  PMH includes:  h/o CVA with residual Lt sided weakness, SVT, osteoporosis, HTN, AFib with rapid ventricular response   OT comments  Pt received seated in recliner, requesting to stay up in recliner. Per chart review pt presents with difficulty with functional sit<>stands, focused session on sit<>stands with pt needing MOD A and use of momentum to power into standing x4. Pt with difficulty shifting weight anteriorly and bringing hips into extension d/t heavy posterior lean. Pt continues to present with decreased activity tolerance, impaired balance, L shoulder pain and generalized weakness. Pt would continue to benefit from skilled occupational therapy while admitted and after d/c to address the below listed limitations in order to improve overall functional mobility and facilitate independence with BADL participation. DC plan remains appropriate, will follow acutely per POC.    Follow Up Recommendations  CIR    Equipment Recommendations  3 in 1 bedside commode;Other (comment) (RW)    Recommendations for Other Services      Precautions / Restrictions Precautions Precautions: Fall Precaution Comments: residual L-sided weakness from prior stroke/ hx L shoulder fx with pending workup outpatient per family Restrictions Weight Bearing Restrictions: No Other Position/Activity Restrictions: pt with very limited ROM to L shoulder (due to pain/weakness)       Mobility Bed Mobility               General bed mobility comments: pt recieved and left up in recliner  Transfers Overall transfer level: Needs assistance Equipment  used: Rolling walker (2 wheeled) Transfers: Sit to/from Stand Sit to Stand: Mod assist         General transfer comment: pt completed x4 sit<>stands from recliner with MOD A to power into standing and shift weight anteriorly as pt with heavy posterior lean in standing    Balance Overall balance assessment: Needs assistance Sitting-balance support: Bilateral upper extremity supported;Feet supported Sitting balance-Leahy Scale: Fair     Standing balance support: Bilateral upper extremity supported;During functional activity Standing balance-Leahy Scale: Poor Standing balance comment: reliant on external assist d/t posterior lean                           ADL either performed or assessed with clinical judgement   ADL Overall ADL's : Needs assistance/impaired                     Lower Body Dressing: Total assistance;Sitting/lateral leans Lower Body Dressing Details (indicate cue type and reason): to adjust socks from recliner   Toilet Transfer Details (indicate cue type and reason): sit<>stands only from recliner MOD A +1         Functional mobility during ADLs: Moderate assistance;Rolling walker (sit<>stand only) General ADL Comments: pt recieved seated in recliner, requesting to stay up in recliner, session focus on functional sit<>stands from recliner x4 needing MOD A +1 d/t heavy posterior lean     Vision       Perception     Praxis      Cognition Arousal/Alertness: Awake/alert Behavior During Therapy: WFL for tasks assessed/performed;Flat affect Overall Cognitive Status: Impaired/Different from baseline Area of Impairment: Safety/judgement;Awareness;Problem solving  Safety/Judgement: Decreased awareness of safety;Decreased awareness of deficits Awareness: Intellectual Problem Solving: Slow processing;Decreased initiation;Requires tactile cues General Comments: pt very pleasant during session, able to state why  she is the hospital.pt able to state her deficits but has a hard time correcting body mechanics needing MAX multimodal cues. pt very flat during session but appropriate.        Exercises Other Exercises Other Exercises: functional sit<>stands from recliner to increase functional endurance for higher level functional mobility tasks   Shoulder Instructions       General Comments VSS on RA BP 122/61 HR 62 O2 96% RR 19 post mobility tasks    Pertinent Vitals/ Pain       Pain Assessment: Faces Faces Pain Scale: Hurts little more Pain Location: generalized, L shoulder Pain Descriptors / Indicators: Discomfort;Grimacing Pain Intervention(s): Limited activity within patient's tolerance;Monitored during session;Repositioned  Home Living                                          Prior Functioning/Environment              Frequency  Min 2X/week        Progress Toward Goals  OT Goals(current goals can now be found in the care plan section)  Progress towards OT goals: Progressing toward goals  Acute Rehab OT Goals Patient Stated Goal: CIR OT Goal Formulation: With patient/family Time For Goal Achievement: 07/31/20 Potential to Achieve Goals: Good  Plan Discharge plan remains appropriate;Frequency remains appropriate    Co-evaluation                 AM-PAC OT "6 Clicks" Daily Activity     Outcome Measure   Help from another person eating meals?: A Little Help from another person taking care of personal grooming?: A Little Help from another person toileting, which includes using toliet, bedpan, or urinal?: A Lot Help from another person bathing (including washing, rinsing, drying)?: A Lot Help from another person to put on and taking off regular upper body clothing?: Total Help from another person to put on and taking off regular lower body clothing?: Total 6 Click Score: 12    End of Session Equipment Utilized During Treatment: Rolling  walker;Gait belt  OT Visit Diagnosis: Unsteadiness on feet (R26.81);Cognitive communication deficit (R41.841)   Activity Tolerance Patient tolerated treatment well   Patient Left in chair;with call bell/phone within reach;with chair alarm set   Nurse Communication Mobility status        Time: 9675-9163 OT Time Calculation (min): 34 min  Charges: OT General Charges $OT Visit: 1 Visit OT Treatments $Therapeutic Activity: 23-37 mins  Harley Alto., COTA/L Acute Rehabilitation Services [place] 07/23/2020, 4:47 PM

## 2020-07-23 NOTE — Progress Notes (Signed)
Inpatient Rehab Admissions Coordinator:   I met with pt. And daughter at bedside to notify them that pt.'s insurance denied CIR stay. She states that they are interested in [place] for SNF rehab if possible. I have notified case Freight forwarder.  CIR will sign off.   Clemens Catholic, [place], [place] Admissions Coordinator  [phone] ([place]) [phone] (office)

## 2020-07-23 NOTE — Progress Notes (Signed)
   Providing Compassionate, Quality Care - Together  NEUROSURGERY PROGRESS NOTE   S: No issues overnight. Did not sleep well per daughter, slightly confused this am  O: EXAM:  BP (!) 147/94 (BP Location: Right Arm)   Pulse (!) 52   Temp 97.6 F (36.4 C) (Axillary)   Resp 14   SpO2 96%   Awake, alert, disoriented Speech fluent Face symmetric  FCx4 CNs grossly intact  MAE well, chronic L sided weakness from prior cva  ASSESSMENT:  87 y.o. female with  1. R Traumatic IPH  PLAN: -supportive care - HA improved with coffee now - pt/ot, cir denied, SNF pending - hold anticoag, SQH ok for dvt ppx   Thank you for allowing me to participate in this patient's care.  Please do not hesitate to call with questions or concerns.   Elwin Sleight, [facility] Cell: [phone]

## 2020-07-23 NOTE — Progress Notes (Signed)
Inpatient Rehab Admissions Coordinator:   I received a call from Pt.'s daughter and she states that she has changed her mind and would like to appeal insurance denial and pursue [facility], so I am starting that process.  Clemens Catholic, [place], [place] Admissions Coordinator  [phone] ([place]) [phone] (office)

## 2020-07-24 MED ORDER — LEVOTHYROXINE SODIUM 112 MCG PO TABS
168.0000 ug | ORAL_TABLET | ORAL | Status: DC
  Administered 2020-07-26: 168 ug via ORAL
  Filled 2020-07-24: qty 2

## 2020-07-24 MED ORDER — LEVOTHYROXINE SODIUM 112 MCG PO TABS
112.0000 ug | ORAL_TABLET | ORAL | Status: DC
  Administered 2020-07-25: 112 ug via ORAL
  Filled 2020-07-24: qty 1

## 2020-07-24 NOTE — Progress Notes (Signed)
Subjective: The patient easily arouses to voice.  Her son is at the bedside.  She has no complaints.  Objective: Vital signs in last 24 hours: Temp:  [97.2 F (36.2 C)-98.7 F (37.1 C)] 97.2 F (36.2 C) (01/08 0742) Pulse Rate:  [59-65] 61 (01/08 0742) Resp:  [16-21] 16 (01/08 0742) BP: (117-153)/(53-93) 153/91 (01/08 0742) SpO2:  [97 %-100 %] 97 % (01/08 0742) Estimated body mass index is 21.92 kg/m as calculated from the following:   Height as of 05/04/20: 5\' 1"  (1.549 m).   Weight as of 05/04/20: 52.6 kg.   Intake/Output from previous day: 01/07 0701 - 01/08 0700 In: 240 [P.O.:240] Out: 800 [Urine:800] Intake/Output this shift: No intake/output data recorded.  Physical exam the patient arouses to voice.  She is pleasant.  She is weak on the left which is her baseline.  Lab Results: No results for input(s): WBC, HGB, HCT, PLT in the last 72 hours. BMET No results for input(s): NA, K, CL, CO2, GLUCOSE, BUN, CREATININE, CALCIUM in the last 72 hours.  Studies/Results: No results found.  Assessment/Plan: Intraparenchymal hemorrhage: The patient is stable neurologically.  We are awaiting skilled nursing facility placement.  I have answered all the son's questions.  LOS: 7 days     Ophelia Charter 07/24/2020, 9:09 AM

## 2020-07-25 MED ORDER — DEXAMETHASONE 0.5 MG PO TABS
1.0000 mg | ORAL_TABLET | Freq: Three times a day (TID) | ORAL | Status: DC
  Administered 2020-07-25 – 2020-07-28 (×11): 1 mg via ORAL
  Filled 2020-07-25 (×12): qty 2

## 2020-07-25 MED ORDER — PANTOPRAZOLE SODIUM 20 MG PO TBEC
20.0000 mg | DELAYED_RELEASE_TABLET | Freq: Two times a day (BID) | ORAL | Status: DC
Start: 2020-07-25 — End: 2020-07-29
  Administered 2020-07-25 – 2020-07-28 (×8): 20 mg via ORAL
  Filled 2020-07-25 (×8): qty 1

## 2020-07-25 NOTE — Plan of Care (Signed)

## 2020-07-25 NOTE — Progress Notes (Signed)
Subjective: The patient is alert and pleasant.  Another son is at the bedside.  He inquired about resuming her Prilosec.  She complains of a headache.  Objective: Vital signs in last 24 hours: Temp:  [97.7 F (36.5 C)-98.4 F (36.9 C)] 98 F (36.7 C) (01/09 0712) Pulse Rate:  [58-93] 93 (01/09 0712) Resp:  [18-20] 20 (01/09 0712) BP: (129-178)/(75-82) 138/82 (01/09 0712) SpO2:  [98 %-100 %] 99 % (01/09 0712) Estimated body mass index is 21.92 kg/m as calculated from the following:   Height as of 05/04/20: 5\' 1"  (1.549 m).   Weight as of 05/04/20: 52.6 kg.   Intake/Output from previous day: 01/08 0701 - 01/09 0700 In: -  Out: 600 [Urine:600] Intake/Output this shift: No intake/output data recorded.  Physical exam the patient is alert and pleasant.  She is chronically weak on the left.  Lab Results: No results for input(s): WBC, HGB, HCT, PLT in the last 72 hours. BMET No results for input(s): NA, K, CL, CO2, GLUCOSE, BUN, CREATININE, CALCIUM in the last 72 hours.  Studies/Results: No results found.  Assessment/Plan: Intracerebral hemorrhage: We are awaiting nursing home placement.  LOS: 8 days     Ophelia Charter 07/25/2020, 9:40 AM

## 2020-07-26 LAB — SARS CORONAVIRUS 2 (TAT 6-24 HRS): SARS Coronavirus 2: NEGATIVE

## 2020-07-26 NOTE — Plan of Care (Signed)

## 2020-07-26 NOTE — TOC Progression Note (Signed)
Transition of Care [facility]) - Progression Note    Patient Details  Name: Nancy Zamora MRN: [id number] Date of Birth: [birth year]  Transition of Care [facility]) CM/SW [place], [place] Phone Number: 07/26/2020, 12:00 PM  Clinical Narrative:     CSW received confirmation [name] can accept patient. CSW called and updated patient's daughter,Sharon.  CSW started insurance authorization for SNF & PTAR- pending decision CSW requested covid test  CSW will continue to follow and assist with discharge planning.  Thurmond Butts, MSW, LCSW Clinical Social Worker    Expected Discharge Plan: Skilled Nursing Facility Barriers to Discharge: Insurance Authorization  Expected Discharge Plan and Services Expected Discharge Plan: [place] In-house Referral: Clinical Social Work                                             Social Determinants of Health (SDOH) Interventions    Readmission Risk Interventions No flowsheet data found.

## 2020-07-26 NOTE — Progress Notes (Signed)
Pt s/e  Complains of HA, pain meds pending. No neuro changes   Awake, alert, oriented. Eating breakfast Chronic L sided weakness PERRL  Assessment:  1. IPH   Plan:  Rehab pending, daughter would like to appeal denial to CIR

## 2020-07-26 NOTE — NC FL2 (Cosign Needed)
[place] LEVEL OF CARE SCREENING TOOL     IDENTIFICATION  Patient Name: Nancy Zamora Birthdate: 08/08/1930 Sex: female Admission Date (Current Location): 07/16/2020  [facility] and [place] Number:  Herbalist and Address:  [facility], [place] [address] [place], [place] [address]      Provider Number: [id number]  Attending Physician Name and Address:  Dawley, Theodoro Doing, DO  Relative Name and Phone Number:       Current Level of Care: Hospital Recommended Level of Care: [place] Prior Approval Number:    Date Approved/Denied:   PASRR Number: [id number]  Discharge Plan: SNF    Current Diagnoses: Patient Active Problem List   Diagnosis Date Noted  . Intraparenchymal hemorrhage of brain ([place]) 07/16/2020  . Traumatic subarachnoid hemorrhage ([place]) 07/16/2020  . Atrial fibrillation with RVR ([place]) 03/31/2019  . SVT (supraventricular tachycardia) ([place]) 03/31/2019  . Essential hypertension 03/31/2019  . Hyperlipidemia 03/31/2019  . Dysphagia due to recent cerebral infarction 03/31/2019  . Fall 03/31/2019  . Hypothyroidism 03/31/2019  . Osteoporosis 03/31/2019  . Malnutrition of moderate degree 03/29/2019  . Airway intubation performed without difficulty   . Acute embolic stroke ([place]) 46/27/0350  . Middle cerebral artery embolism, right 03/26/2019    Orientation RESPIRATION BLADDER Height & Weight     Self,Time,Situation,Place  Normal External catheter,Incontinent Weight:   Height:     BEHAVIORAL SYMPTOMS/MOOD NEUROLOGICAL BOWEL NUTRITION STATUS      Incontinent Diet (please see discharge summary)  AMBULATORY STATUS COMMUNICATION OF NEEDS Skin     Verbally  (wound/incision skin tear arem, left lower)                       Personal Care Assistance Level of Assistance  Bathing,Feeding,Dressing Bathing Assistance: Limited assistance Feeding assistance: Independent Dressing Assistance: Limited  assistance     Functional Limitations Info  Sight,Hearing,Speech Sight Info: Adequate Hearing Info: Adequate Speech Info: Adequate    SPECIAL CARE FACTORS FREQUENCY  PT (By licensed PT),OT (By licensed OT)     PT Frequency: 5x per week OT Frequency: 5x per week            Contractures Contractures Info: Not present    Additional Factors Info  Code Status,Allergies Code Status Info: DNR Allergies Info: Penicillins,Tetracyclines & Related,Chocolate,Codeine,Gabapentin,Tape           Current Medications (07/26/2020):  This is the current hospital active medication list Current Facility-Administered Medications  Medication Dose Route Frequency Provider Last Rate Last Admin  . 0.9 %  sodium chloride infusion  250 mL Intravenous PRN Dawley, Troy C, DO      . 0.9 %  sodium chloride infusion   Intravenous Continuous Meyran, Ocie Cornfield, NP 50 mL/hr at 07/21/20 2200 New Bag at 07/21/20 2200  . acetaminophen (TYLENOL) tablet 650 mg  650 mg Oral Q6H PRN Dawley, Troy C, DO   650 mg at 07/26/20 1126   Or  . acetaminophen (TYLENOL) suppository 650 mg  650 mg Rectal Q6H PRN Dawley, Troy C, DO      . acyclovir (ZOVIRAX) 200 MG capsule 800 mg  800 mg Oral TID Dawley, Troy C, DO   800 mg at 07/26/20 0906  . butalbital-acetaminophen-caffeine (FIORICET) 50-325-40 MG per tablet 1 tablet  1 tablet Oral Q4H PRN Dawley, Troy C, DO   1 tablet at 07/26/20 1228  . Chlorhexidine Gluconate Cloth 2 % PADS 6 each  6 each Topical Daily  Dawley, Troy C, DO   6 each at 07/26/20 [phone]  . dexamethasone (DECADRON) tablet 1 mg  1 mg Oral Q8H Newman Pies, MD   1 mg at 07/26/20 1501  . heparin injection 5,000 Units  5,000 Units Subcutaneous Q12H Dawley, Troy C, DO   5,000 Units at 07/26/20 0907  . hydrALAZINE (APRESOLINE) injection 5 mg  5 mg Intravenous Q4H PRN Dawley, Troy C, DO   5 mg at 07/20/20 0825  . ipratropium (ATROVENT) 0.06 % nasal spray 2 spray  2 spray Each Nare QID PRN Meyran, Ocie Cornfield, NP   2 spray at 07/20/20 1629  . levETIRAcetam (KEPPRA) tablet 500 mg  500 mg Oral BID Dawley, Troy C, DO   500 mg at 07/26/20 0906  . levothyroxine (SYNTHROID) tablet 112 mcg  112 mcg Oral Once per day on Sun Wed Thu Fri Sat Viona Gilmore D, NP   112 mcg at 07/25/20 7591  . levothyroxine (SYNTHROID) tablet 168 mcg  168 mcg Oral Once per day on Mon Tue Bergman, Meghan D, NP   168 mcg at 07/26/20 0646  . losartan (COZAAR) tablet 25 mg  25 mg Oral QODAY Meyran, Ocie Cornfield, NP   25 mg at 07/26/20 0906  . MEDLINE mouth rinse  15 mL Mouth Rinse BID Erline Levine, MD   15 mL at 07/26/20 0907  . methocarbamol (ROBAXIN) 500 mg in dextrose 5 % 50 mL IVPB  500 mg Intravenous Q6H PRN Dawley, Troy C, DO      . metoprolol tartrate (LOPRESSOR) tablet 25 mg  25 mg Oral Daily Erline Levine, MD   25 mg at 07/26/20 0906  . ondansetron (ZOFRAN) injection 4 mg  4 mg Intravenous Q6H PRN Erline Levine, MD   4 mg at 07/20/20 0904  . pantoprazole (PROTONIX) EC tablet 20 mg  20 mg Oral BID Newman Pies, MD   20 mg at 07/26/20 0906  . polyethylene glycol (MIRALAX / GLYCOLAX) packet 17 g  17 g Oral Daily Meyran, Ocie Cornfield, NP   17 g at 07/26/20 0907  . sodium chloride flush (NS) 0.9 % injection 3 mL  3 mL Intravenous Q12H Dawley, Troy C, DO   3 mL at 07/26/20 0910  . sodium chloride flush (NS) 0.9 % injection 3 mL  3 mL Intravenous PRN Dawley, Troy C, DO      . traMADol (ULTRAM) tablet 50 mg  50 mg Oral Q12H PRN Dawley, Troy C, DO   50 mg at 07/26/20 0906     Discharge Medications: Please see discharge summary for a list of discharge medications.  Relevant Imaging Results:  Relevant Lab Results:   Additional Information SSN [id number]  Vinie Sill, [place]

## 2020-07-26 NOTE — Progress Notes (Signed)
Physical Therapy Treatment Patient Details Name: Nancy Zamora MRN: [id number] DOB: 09-14-1930 Today's Date: 07/26/2020    History of Present Illness This 87 y.o. female admitted after sustaining a fall.  CT of head showed intraparenchymal hemorrhage Rt inferior frontal lobe.  Repeat CT scan showed marked progression of the bleed and underwent reversal of Eliquis.  PMH includes:  h/o CVA with residual Lt sided weakness, SVT, osteoporosis, HTN, AFib with rapid ventricular response    PT Comments    The pt remains motivated to progress with PT and mobility goals, but presented with increased difficulty of processing and motor planning at this time, resulting in significant increase in assist and multimodal cueing needed to complete transfers and gait both in her room and in hallway. The pt was still able to complete multiple bouts of ambulation and multiple sit-stand transfers, but did require mod/maxA to steady as well as to manage RW and advance LLE with all turns. The pt will continue to benefit from max therapies to progress functional strength and stability to allow for improved gait and independence prior to return home to decreased caregiver burden.    Follow Up Recommendations  SNF;Supervision/Assistance - 24 hour     Equipment Recommendations   (has DME at home)    Recommendations for Other Services       Precautions / Restrictions Precautions Precautions: Fall Precaution Comments: residual L-sided weakness from prior stroke/ hx L shoulder fx with pending workup outpatient per family Restrictions Weight Bearing Restrictions: No Other Position/Activity Restrictions: pt with very limited ROM to L shoulder (due to pain/weakness)    Mobility  Bed Mobility Overal bed mobility: Needs Assistance             General bed mobility comments: pt recieved and left up in recliner  Transfers Overall transfer level: Needs assistance Equipment used: Rolling walker (2  wheeled) Transfers: Sit to/from Stand Sit to Stand: Max assist;Mod assist         General transfer comment: pt completed x10 through session from variety of surfaces. benefits from repeated cues for UE placement and  Ambulation/Gait Ambulation/Gait assistance: Max assist;Mod assist Gait Distance (Feet): 100 Feet Assistive device: Rolling walker (2 wheeled) Gait Pattern/deviations: Decreased stride length;Shuffle;Narrow base of support;Leaning posteriorly;Step-through pattern Gait velocity: decr Gait velocity interpretation: <1.31 ft/sec, indicative of household ambulator General Gait Details: pt needing increased cues today to continue all walking/stepping. significant increase in scissoring and narrow BOS with steps, frequently stepping only with RLE until cued to also step LLE forward. modA to facilitate wt shift for LLE with fatigue.      Modified Rankin (Stroke Patients Only) Modified Rankin (Stroke Patients Only) Pre-Morbid Rankin Score: Moderate disability Modified Rankin: Moderately severe disability     Balance Overall balance assessment: Needs assistance Sitting-balance support: Bilateral upper extremity supported;Feet supported Sitting balance-Leahy Scale: Fair Sitting balance - Comments: able to sit EOB unsupported   Standing balance support: Bilateral upper extremity supported;During functional activity Standing balance-Leahy Scale: Poor Standing balance comment: reliant on external assist d/t posterior lean                            Cognition Arousal/Alertness: Awake/alert Behavior During Therapy: WFL for tasks assessed/performed;Flat affect Overall Cognitive Status: Impaired/Different from baseline Area of Impairment: Orientation;Memory;Safety/judgement;Awareness;Problem solving                 Orientation Level: Disoriented to;Place;Situation;Time (thinks she is at her home, despite being able to  say the room we are in does not look like  her home and that staff keep telling her she is at hospital)   Memory: Decreased short-term memory   Safety/Judgement: Decreased awareness of safety;Decreased awareness of deficits Awareness: Intellectual Problem Solving: Slow processing;Decreased initiation;Requires tactile cues General Comments: pt with flat affect today, much less talkative than normal. significantly slowed processing and movement despite attempts at multimodal cues, descriptions, or short cues. frequent assist for safety due to lack of safety awareness with mobility             Pertinent Vitals/Pain Pain Assessment: Faces Faces Pain Scale: Hurts even more Pain Location: generalized, L shoulder with ROM Pain Descriptors / Indicators: Discomfort;Grimacing Pain Intervention(s): Limited activity within patient's tolerance;Monitored during session;Repositioned           PT Goals (current goals can now be found in the care plan section) Acute Rehab PT Goals Patient Stated Goal: to not be a burden PT Goal Formulation: With patient/family Time For Goal Achievement: 07/31/20 Potential to Achieve Goals: Good Progress towards PT goals: Not progressing toward goals - comment (increased difficulty with gait today, will continue to challenge with therapy.)    Frequency    Min 3X/week      PT Plan Current plan remains appropriate       AM-PAC PT "6 Clicks" Mobility   Outcome Measure  Help needed turning from your back to your side while in a flat bed without using bedrails?: A Little Help needed moving from lying on your back to sitting on the side of a flat bed without using bedrails?: A Lot Help needed moving to and from a bed to a chair (including a wheelchair)?: A Lot Help needed standing up from a chair using your arms (e.g., wheelchair or bedside chair)?: A Lot Help needed to walk in hospital room?: A Lot Help needed climbing 3-5 steps with a railing? : Total 6 Click Score: 12    End of Session  Equipment Utilized During Treatment: Gait belt Activity Tolerance: Patient tolerated treatment well Patient left: in chair;with call bell/phone within reach;with chair alarm set;with family/visitor present Nurse Communication: Mobility status PT Visit Diagnosis: Unsteadiness on feet (R26.81);Difficulty in walking, not elsewhere classified (R26.2)     Time: 1749-4496 PT Time Calculation (min) (ACUTE ONLY): 52 min  Charges:  $Gait Training: 23-37 mins $Therapeutic Activity: 8-22 mins                     Karma Ganja, PT, DPT   Acute Rehabilitation Department Pager #: [phone]   Otho Bellows 07/26/2020, 3:15 PM

## 2020-07-26 NOTE — TOC Progression Note (Signed)
Transition of Care [facility]) - Progression Note    Patient Details  Name: Nancy Zamora MRN: [id number] Date of Birth: 05-24-1931  [facility] CM/SW [place], [place] Phone Number: 07/26/2020, 4:49 PM  Clinical Narrative:     Insurance and covid remains pending   Thurmond Butts, MSW, LCSW Clinical Social Worker   Expected Discharge Plan: Skilled Nursing Facility Barriers to Discharge: Ship broker  Expected Discharge Plan and Services Expected Discharge Plan: [place] In-house Referral: Clinical Social Work                                             Social Determinants of Health (SDOH) Interventions    Readmission Risk Interventions No flowsheet data found.

## 2020-07-26 NOTE — Progress Notes (Addendum)
Inpatient Rehabilitation Admissions Coordinator   Appeal with [employer] was begun on Friday per [employer]. We await their determination for CIR vs SNF. Noted [facility] has accepted patient and they are pursuing authorization.  Danne Baxter, RN, MSN Rehab Admissions Coordinator [phone] 07/26/2020 1:27 PM   I have received appeal determination that the denial was upheld. CIR not approved. I contacted pt's daughter, Ivin Booty , by phone an she is aware. I have alerted acute team and TOC. We will sign off at this time.  Danne Baxter, RN, MSN Rehab Admissions Coordinator [phone] 07/26/2020 2:54 PM '

## 2020-07-27 MED ORDER — ALUM & MAG HYDROXIDE-SIMETH 200-200-20 MG/5ML PO SUSP
30.0000 mL | Freq: Four times a day (QID) | ORAL | Status: DC | PRN
  Administered 2020-07-27: 30 mL via ORAL
  Filled 2020-07-27: qty 30

## 2020-07-27 NOTE — Discharge Instructions (Signed)
Fall Prevention in the Home, Adult Falls can cause injuries and can affect people from all age groups. There are many simple things that you can do to make your home safe and to help prevent falls. Ask for help when making these changes, if needed. What actions can I take to prevent falls? General instructions  Use good lighting in all rooms. Replace any light bulbs that burn out.  Turn on lights if it is dark. Use night-lights.  Place frequently used items in easy-to-reach places. Lower the shelves around your home if necessary.  Set up furniture so that there are clear paths around it. Avoid moving your furniture around.  Remove throw rugs and other tripping hazards from the floor.  Avoid walking on wet floors.  Fix any uneven floor surfaces.  Add color or contrast paint or tape to grab bars and handrails in your home. Place contrasting color strips on the first and last steps of stairways.  When you use a stepladder, make sure that it is completely opened and that the sides are firmly locked. Have someone hold the ladder while you are using it. Do not climb a closed stepladder.  Be aware of any and all pets. What can I do in the bathroom?  Keep the floor dry. Immediately clean up any water that spills onto the floor.  Remove soap buildup in the tub or shower on a regular basis.  Use non-skid mats or decals on the floor of the tub or shower.  Attach bath mats securely with double-sided, non-slip rug tape.  If you need to sit down while you are in the shower, use a plastic, non-slip stool.  Install grab bars by the toilet and in the tub and shower. Do not use towel bars as grab bars.      What can I do in the bedroom?  Make sure that a bedside light is easy to reach.  Do not use oversized bedding that drapes onto the floor.  Have a firm chair that has side arms to use for getting dressed. What can I do in the kitchen?  Clean up any spills right away.  If you need to  reach for something above you, use a sturdy step stool that has a grab bar.  Keep electrical cables out of the way.  Do not use floor polish or wax that makes floors slippery. If you must use wax, make sure that it is non-skid floor wax. What can I do in the stairways?  Do not leave any items on the stairs.  Make sure that you have a light switch at the top of the stairs and the bottom of the stairs. Have them installed if you do not have them.  Make sure that there are handrails on both sides of the stairs. Fix handrails that are broken or loose. Make sure that handrails are as long as the stairways.  Install non-slip stair treads on all stairs in your home.  Avoid having throw rugs at the top or bottom of stairways, or secure the rugs with carpet tape to prevent them from moving.  Choose a carpet design that does not hide the edge of steps on the stairway.  Check any carpeting to make sure that it is firmly attached to the stairs. Fix any carpet that is loose or worn. What can I do on the outside of my home?  Use bright outdoor lighting.  Regularly repair the edges of walkways and driveways and fix any cracks.  Remove high doorway thresholds.  Trim any shrubbery on the main path into your home.  Regularly check that handrails are securely fastened and in good repair. Both sides of any steps should have handrails.  Install guardrails along the edges of any raised decks or porches.  Clear walkways of debris and clutter, including tools and rocks.  Have leaves, snow, and ice cleared regularly.  Use sand or salt on walkways during winter months.  In the garage, clean up any spills right away, including grease or oil spills. What other actions can I take?  Wear closed-toe shoes that fit well and support your feet. Wear shoes that have rubber soles or low heels.  Use mobility aids as needed, such as canes, walkers, scooters, and crutches.  Review your medicines with your  health care provider. Some medicines can cause dizziness or changes in blood pressure, which increase your risk of falling. Talk with your health care provider about other ways that you can decrease your risk of falls. This may include working with a physical therapist or trainer to improve your strength, balance, and endurance. Where to find more information  Centers for Disease Control and Prevention, STEADI: TVDivision.uy  [employer] on Aging: RingConnections.si Contact a health care provider if:  You are afraid of falling at home.  You feel weak, drowsy, or dizzy at home.  You fall at home. Summary  There are many simple things that you can do to make your home safe and to help prevent falls.  Ways to make your home safe include removing tripping hazards and installing grab bars in the bathroom.  Ask for help when making these changes in your home. This information is not intended to replace advice given to you by your health care provider. Make sure you discuss any questions you have with your health care provider. Document Revised: 06/15/2017 Document Reviewed: 02/15/2017 Elsevier Patient Education  2021 Elsevier Inc.   [facility] (pp. 6392012303). [place], [place]. Elsevier."> Neurosurgery, 80(1), 6-15. Retrieved on January 05, 2019.[email or web address]"> Primary Care (5th ed., pp. 218-221). [place], [place]: Elsevier."> Rosen's Emergency Medicine: Concepts and Clinical Practice (9th ed., pp. 301-329). [place], [place]: Elsevier."> Neurosurgery, 75 Suppl 1, S3-15. Retrieved on January 05, 2019.[email or web address]">  Head Injury, Adult There are many types of head injuries. Head injuries can be as minor as a small bump, or they can be a serious medical issue. More severe head injuries include:  A jarring injury to the brain (concussion).  A bruise (contusion) of the brain. This means there  is bleeding in the brain that can cause swelling.  A cracked skull (skull fracture).  Bleeding in the brain that collects, clots, and forms a bump (hematoma). After a head injury, most problems occur within the first 24 hours, but side effects may occur up to 7-10 days after the injury. It is important to watch your condition for any changes. You may need to be observed in the emergency department or urgent care, or you may be admitted to the hospital. What are the causes? There are many possible causes of a head injury. Serious head injuries may be caused by car accidents, bicycle or motorcycle accidents, sports injuries, falls, or being struck by an object. What are the symptoms? Symptoms of a head injury include a contusion, bump, or bleeding at the site of the injury. Other physical symptoms may include:  Headache.  Nausea or vomiting.  Dizziness.  Blurred or double vision.  Being uncomfortable around bright lights  or loud noises.  Seizures.  Feeling tired.  Trouble being awakened.  Loss of consciousness. Mental or emotional symptoms may include:  Irritability.  Confusion and memory problems.  Poor attention and concentration.  Changes in eating or sleeping habits.  Anxiety or depression. How is this diagnosed? This condition can usually be diagnosed based on your symptoms, a description of the injury, and a physical exam. You may also have imaging tests done, such as a CT scan or an MRI. How is this treated? Treatment for this condition depends on the severity and type of injury you have. The main goal of treatment is to prevent complications and allow the brain time to heal. Mild head injury If you have a mild head injury, you may be sent home, and treatment may include:  Observation. A responsible adult should stay with you for 24 hours after your injury and check on you often.  Physical rest.  Brain rest.  Pain medicines. Severe head injury If you have a  severe head injury, treatment may include:  Close observation. This includes hospitalization with the following care: ? Frequent physical exams. ? Frequent checks of how your brain and nervous system are working (neurological status). ? Checking your blood pressure and oxygen levels.  Medicines to relieve pain, prevent seizures, and decrease brain swelling.  Airway protection and breathing support. This may include using a ventilator.  Treatments that monitor and manage swelling inside the brain.  Brain surgery. This may be needed to: ? Remove a collection of blood or blood clots. ? Stop the bleeding. ? Remove a part of the skull to allow room for the brain to swell. Follow these instructions at home: Activity  Rest and avoid activities that are physically hard or tiring.  Make sure you get enough sleep.  Let your brain rest by limiting activities that require a lot of thought or attention, such as: ? Watching TV. ? Playing memory games and puzzles. ? Job-related work or homework. ? Working on [employer], and texting.  Avoid activities that could cause another head injury, such as playing sports, until your health care provider approves. Having another head injury, especially before the first one has healed, can be dangerous.  Ask your health care provider when it is safe for you to return to your regular activities, including work or school. Ask your health care provider for a step-by-step plan for gradually returning to activities.  Ask your health care provider when you can drive, ride a bicycle, or use heavy machinery. Your ability to react may be slower after a brain injury. Do not do these activities if you are dizzy. Lifestyle  Do not drink alcohol until your health care provider approves. Do not use drugs. Alcohol and certain drugs may slow your recovery and can put you at risk of further injury.  If it is harder than usual to remember things,  write them down.  If you are easily distracted, try to do one thing at a time.  Talk with family members or close friends when making important decisions.  Tell your friends, family, a trusted colleague, and work Freight forwarder about your injury, symptoms, and restrictions. Have them watch for any new or worsening problems.   General instructions  Take over-the-counter and prescription medicines only as told by your health care provider.  Have someone stay with you for 24 hours after your head injury. This person should watch you for any changes in your symptoms and be ready  to seek medical help.  Keep all follow-up visits as told by your health care provider. This is important. How is this prevented?  Work on improving your balance and strength to avoid falls.  Wear a seat belt when you are in a moving vehicle.  Wear a helmet when riding a bicycle, skiing, or doing any other sport or activity that has a risk of injury.  If you drink alcohol: ? Limit how much you use to:  0-1 drink a day for nonpregnant women.  0-2 drinks a day for men. ? Be aware of how much alcohol is in your drink. In the [place] one drink equals one 12 oz bottle of beer (355 mL), one 5 oz glass of wine (148 mL), or one 1 oz glass of hard liquor (44 mL).  Take safety measures in your home, such as: ? Removing clutter and tripping hazards from floors and stairways. ? Using grab bars in bathrooms and handrails by stairs. ? Placing non-slip mats on floors and in bathtubs. ? Improving lighting in dim areas. Where to find more information  Centers for Disease Control and Prevention: http://www.wolf.info/ Get help right away if:  You have: ? A severe headache that is not helped by medicine. ? Trouble walking or weakness in your arms and legs. ? Clear or bloody fluid coming from your nose or ears. ? Changes in your vision. ? A seizure. ? Increased confusion or irritability.  Your symptoms get worse.  You are sleepier than  normal and have trouble staying awake.  You lose your balance.  Your pupils change size.  Your speech is slurred.  Your dizziness gets worse.  You vomit. These symptoms may represent a serious problem that is an emergency. Do not wait to see if the symptoms will go away. Get medical help right away. Call your local emergency services (911 in the [place] Do not drive yourself to the hospital. Summary  Head injuries can be minor, or they can be a serious medical issue requiring immediate attention.  Treatment for this condition depends on the severity and type of injury you have.  Have someone stay with you for 24 hours after your injury and check on you often.  Ask your health care provider when it is safe for you to return to your regular activities, including work or school.  Head injury prevention includes wearing a seat belt in a motor vehicle, using a helmet on a bicycle, limiting alcohol use, and taking safety measures in your home. This information is not intended to replace advice given to you by your health care provider. Make sure you discuss any questions you have with your health care provider. Document Revised: 05/16/2019 Document Reviewed: 05/16/2019 Elsevier Patient Education  2021 [employer].

## 2020-07-27 NOTE — Progress Notes (Signed)
   Providing Compassionate, Quality Care - Together  NEUROSURGERY PROGRESS NOTE   S: No issues overnight. Doing well, awaiting snf  O: EXAM:  BP 126/76 (BP Location: Right Arm)   Pulse 60   Temp 98.5 F (36.9 C) (Oral)   Resp 19   SpO2 98%   Awake, alert, disoriented Speech fluent Face symmetric  FCx4 CNs grossly intact  MAE well, chronic L sided weakness from prior cva  ASSESSMENT:  87 y.o. female with  1. R Traumatic IPH  PLAN: -supportive care - pt/ot, cir denied, SNF pending - hold anticoag, SQH ok for dvt ppx     Thank you for allowing me to participate in this patient's care.  Please do not hesitate to call with questions or concerns.   Elwin Sleight, [facility] Cell: [phone]

## 2020-07-27 NOTE — TOC Progression Note (Signed)
Transition of Care [facility]) - Progression Note    Patient Details  Name: Nancy Zamora MRN: [id number] Date of Birth: August 29, 1930  Transition of Care [facility]) CM/SW [place], [place] Phone Number: 07/27/2020, 8:51 AM  Clinical Narrative:     Insurance authorization remains pending   Thurmond Butts, MSW, LCSW Clinical Social Worker   Expected Discharge Plan: Skilled Nursing Facility Barriers to Discharge: Ship broker  Expected Discharge Plan and Services Expected Discharge Plan: [place] In-house Referral: Clinical Social Work                                             Social Determinants of Health (SDOH) Interventions    Readmission Risk Interventions No flowsheet data found.

## 2020-07-27 NOTE — TOC Progression Note (Signed)
Transition of Care [facility]) - Progression Note    Patient Details  Name: Nancy Zamora MRN: [id number] Date of Birth: Jul 23, 1930  Transition of Care [facility]) CM/SW [place], [place] Phone Number: 07/27/2020, 2:33 PM  Clinical Narrative:     Received call form HTA- SNF authorization approved # [id number] and PTAR approved # [id number]   MD currently in surgery- informed SNF will need discharge summary by 4pm - to insure SNF/pharmacy is able to get her medications.  CSW called patient's daughter,Sharon - advised of insurance auth and potential d/c today.  Thurmond Butts, MSW, LCSW Clinical Social Worker   Expected Discharge Plan: Skilled Nursing Facility Barriers to Discharge: Insurance Authorization  Expected Discharge Plan and Services Expected Discharge Plan: [place] In-house Referral: Clinical Social Work                                             Social Determinants of Health (SDOH) Interventions    Readmission Risk Interventions No flowsheet data found.

## 2020-07-27 NOTE — Progress Notes (Signed)
Occupational Therapy Treatment Patient Details Name: Nancy Zamora MRN: [id number] DOB: March 06, 1931 Today's Date: 07/27/2020    History of present illness This 87 y.o. female admitted after sustaining a fall.  CT of head showed intraparenchymal hemorrhage Rt inferior frontal lobe.  Repeat CT scan showed marked progression of the bleed and underwent reversal of Eliquis.  PMH includes:  h/o CVA with residual Lt sided weakness, SVT, osteoporosis, HTN, AFib with rapid ventricular response   OT comments  Pt received asleep in recliner but agreeable to session stating, "I want to try." pt continues to present with decreased activity tolerance, generalized weakness ( L side>R side) and impaired balance. Session focus on functional mobility as precursor to higher level functional mobility tasks. Pt required MOD A to power into standing from recliner needing assist to shift weight anteriorly and bring LUE to RW. Pt able to take a couple steps this session but really would benefit from +2 assist as pt now needs assist advancing LLE during ambulation and would benefit from close chair follow as pt needing to sit quickly today. Will continue to follow pt acutely per POC.   Follow Up Recommendations  CIR    Equipment Recommendations  3 in 1 bedside commode;Other (comment) (RW)    Recommendations for Other Services      Precautions / Restrictions Precautions Precautions: Fall Precaution Comments: residual L-sided weakness from prior stroke/ hx L shoulder fx with pending workup outpatient per family Restrictions Weight Bearing Restrictions: No Other Position/Activity Restrictions: pt with very limited ROM to L shoulder (due to pain/weakness)       Mobility Bed Mobility               General bed mobility comments: pt recieved and left up in recliner  Transfers Overall transfer level: Needs assistance Equipment used: Rolling walker (2 wheeled) Transfers: Sit to/from Stand Sit to Stand: Mod  assist         General transfer comment: pt completed x4 sit<>stands from recliner needing MOD A to power into standing d/t heavy posterior lean needing assist to shift hips anteriorly and assist to place LUE on RW    Balance Overall balance assessment: Needs assistance Sitting-balance support: Bilateral upper extremity supported;Feet supported Sitting balance-Leahy Scale: Poor Sitting balance - Comments: able to sit at edge of recliner with BUE supported with minguard   Standing balance support: Bilateral upper extremity supported;During functional activity Standing balance-Leahy Scale: Poor Standing balance comment: reliant on external assist d/t posterior lean                           ADL either performed or assessed with clinical judgement   ADL Overall ADL's : Needs assistance/impaired                 Upper Body Dressing : Maximal assistance;Sitting Upper Body Dressing Details (indicate cue type and reason): to don gown as back side cover Lower Body Dressing: Total assistance;Sitting/lateral leans Lower Body Dressing Details (indicate cue type and reason): to adjust socks from recliner Toilet Transfer: Maximal assistance;+2 for physical assistance;Set up;RW;Ambulation Toilet Transfer Details (indicate cue type and reason): simulated via functional mobility however pt only able to take a couple steps d/t decreased abillity to advance LLE needing MAX A to shift weight laterally to R to advance LLE, really needs +2 for transfer progression         Functional mobility during ADLs: Moderate assistance;Maximal assistance;Rolling walker General ADL Comments: pt  continues to present with L sided weakness and decreased activity tolerance     Vision       Perception     Praxis      Cognition Arousal/Alertness: Awake/alert Behavior During Therapy: WFL for tasks assessed/performed Overall Cognitive Status: Impaired/Different from baseline Area of  Impairment: Memory;Safety/judgement;Awareness;Problem solving;Attention                     Memory: Decreased short-term memory Following Commands: Follows one step commands with increased time Safety/Judgement: Decreased awareness of safety;Decreased awareness of deficits Awareness: Intellectual Problem Solving: Slow processing;Decreased initiation;Requires tactile cues General Comments: pt continues to present with mild cognitive impairements with pt noted to be slow to process needing increased time to follow commands. pt with impaired awareness as pt unaware when LUE drops from RW. pt requires tactile cues at times for mobility tasks        Exercises     Shoulder Instructions       General Comments VSS on RA    Pertinent Vitals/ Pain       Pain Assessment: Faces Faces Pain Scale: Hurts even more Pain Location: generalized, L shoulder with ROM Pain Descriptors / Indicators: Discomfort;Grimacing Pain Intervention(s): Monitored during session;Repositioned;Limited activity within patient's tolerance  Home Living                                          Prior Functioning/Environment              Frequency  Min 2X/week        Progress Toward Goals  OT Goals(current goals can now be found in the care plan section)  Progress towards OT goals: Progressing toward goals  Acute Rehab OT Goals Patient Stated Goal: to not be a burden OT Goal Formulation: With patient/family Time For Goal Achievement: 07/31/20 Potential to Achieve Goals: Good  Plan Discharge plan remains appropriate;Frequency remains appropriate    Co-evaluation                 AM-PAC OT "6 Clicks" Daily Activity     Outcome Measure   Help from another person eating meals?: None Help from another person taking care of personal grooming?: A Little Help from another person toileting, which includes using toliet, bedpan, or urinal?: A Lot Help from another person  bathing (including washing, rinsing, drying)?: A Lot Help from another person to put on and taking off regular upper body clothing?: A Lot Help from another person to put on and taking off regular lower body clothing?: Total 6 Click Score: 14    End of Session Equipment Utilized During Treatment: Gait belt;Rolling walker  OT Visit Diagnosis: Unsteadiness on feet (R26.81);Cognitive communication deficit (R41.841)   Activity Tolerance Patient tolerated treatment well   Patient Left in chair;with call bell/phone within reach   Nurse Communication Mobility status        Time: 9381-8299 OT Time Calculation (min): 34 min  Charges: OT General Charges $OT Visit: 1 Visit OT Treatments $Therapeutic Activity: 23-37 mins  Harley Alto., COTA/L Acute Rehabilitation Services [place] 07/27/2020, 5:18 PM

## 2020-07-28 MED ORDER — METOPROLOL TARTRATE 25 MG PO TABS: 25.0000 mg | ORAL_TABLET | Freq: Every day | ORAL | 1 refills | Status: AC

## 2020-07-28 MED ORDER — LEVETIRACETAM 500 MG PO TABS: 500.0000 mg | ORAL_TABLET | Freq: Two times a day (BID) | ORAL | 6 refills | Status: AC

## 2020-07-28 MED ORDER — BUTALBITAL-APAP-CAFFEINE 50-325-40 MG PO TABS: 1.0000 | ORAL_TABLET | ORAL | 0 refills | Status: AC | PRN

## 2020-07-28 NOTE — TOC Transition Note (Signed)
Transition of Care [facility]) - CM/SW Discharge Note   Patient Details  Name: Nancy Zamora MRN: [id number] Date of Birth: 09-29-1930  [facility]) CM/SW Contact:  Vinie Sill, [place] Phone Number: 07/28/2020, 2:35 PM   Clinical Narrative:     Patient will DC to: [facility] DC Date: 07/28/2020 Family Notified: Sharon,daughter Transport JY:NWGN  Per MD patient is ready for discharge. RN, patient, and facility notified of DC. Discharge Summary sent to facility. RN given number for [phone]. Ambulance transport requested for patient.   Clinical Social Worker signing off.  Thurmond Butts, MSW, LCSW Clinical Social Worker    Final next level of care: Skilled Nursing Facility Barriers to Discharge: Barriers Resolved   Patient Goals and CMS Choice        Discharge Placement              Patient chooses bed at: [name] Patient to be transferred to facility by: [place] Name of family member notified: daughter Patient and family notified of of transfer: 07/27/20  Discharge Plan and Services In-house Referral: Clinical Social Work                                   Social Determinants of Health (SDOH) Interventions     Readmission Risk Interventions No flowsheet data found.

## 2020-07-28 NOTE — Progress Notes (Signed)
OT Cancellation Note  Patient Details Name: Nancy Zamora MRN: [id number] DOB: Feb 09, 1931   Cancelled Treatment:    Reason Eval/Treat Not Completed: Other (comment) pt reports wanting to eat breakfast prior to OT session with pts daughter present, will check back as time allows for OT session.  Corinne Ports K., COTA/L Acute Rehabilitation Services [phone]   Nancy Zamora 07/28/2020, 8:16 AM

## 2020-07-28 NOTE — Discharge Summary (Signed)
Physician Discharge Summary  Patient ID: Nancy Zamora MRN: [id number] DOB/AGE: 1931/04/17 87 y.o.  Admit date: 07/16/2020 Discharge date: 07/28/2020  Admission Diagnoses:  1.  Traumatic intraparenchymal hemorrhage 2.  Seizures  Discharge Diagnoses:  Same Active Problems:   Intraparenchymal hemorrhage of brain [facility])   Traumatic subarachnoid hemorrhage [facility])   Discharged Condition: Stable  Hospital Course:  Nancy Zamora is a 87 y.o. female that presented with a fall and hitting her head on her nightstand.  She was on Eliquis due to previous thromboembolic stroke.  Her CT revealed a right frontal intraparenchymal hematoma which initially on imaging expanded but then was found to be stable on repeat imaging.  Neurologically she remained stable throughout her hospitalization.  She did have multiple episodes of seizures in the emergency department that resolved once Keppra was initiated.  She was evaluated by PT/OT and felt to be a good rehab candidate, was denied CIR however was accepted to SNF.  Her headaches were controlled with medication throughout the hospitalization.  She remained at her neurologic baseline throughout the hospitalization.  She was having normal bowel and bladder function and tolerating a normal diet upon discharge.  Instructions were given for follow-up as well as medication continuation.  She will follow-up with myself and [facility].  Treatments: Neurologic monitoring  Discharge Exam: Blood pressure (!) 174/78, pulse 68, temperature 97.7 F (36.5 C), temperature source Oral, resp. rate 20, SpO2 100 %. Awake, alert, oriented Speech fluent, appropriate CN grossly intact Right periorbital ecchymoses, stable Chronic left-sided weakness from previous CVA Sensory intact to light touch  Disposition: Discharge disposition: 03-Skilled Nursing Facility        Allergies as of 07/28/2020      Reactions   Penicillins Hives   Tetracyclines  & Related Hives   Chocolate Other (See Comments)   Mouth ulcers   Codeine Other (See Comments)   "makes me feel woozy"   Gabapentin Nausea And Vomiting   Tape Rash   Tears skin      Medication List    STOP taking these medications   acetaminophen 500 MG tablet Commonly known as: TYLENOL   apixaban 2.5 MG Tabs tablet Commonly known as: ELIQUIS   traMADol 50 MG tablet Commonly known as: ULTRAM     TAKE these medications   acyclovir 800 MG tablet Commonly known as: ZOVIRAX Take 800 mg by mouth 3 (three) times daily as needed (flare up).   atorvastatin 20 MG tablet Commonly known as: LIPITOR Take 1 tablet (20 mg total) by mouth daily at 6 PM.   calcium-vitamin D 500-200 MG-UNIT Tabs tablet Commonly known as: OSCAL WITH D Take 1 tablet by mouth daily.   Carboxymethylcell-Hypromellose 0.25-0.3 % Gel Place 1 drop into both eyes daily.   Cholecalciferol 25 MCG (1000 UT) tablet Take 1,000 Units by mouth daily.   diclofenac Sodium 1 % Gel Commonly known as: VOLTAREN Apply 2 g topically 3 (three) times daily. Shoulders and knees   escitalopram 5 MG tablet Commonly known as: LEXAPRO Take 5 mg by mouth daily.   fexofenadine 180 MG tablet Commonly known as: ALLEGRA Take 180 mg by mouth daily.   flecainide 50 MG tablet Commonly known as: TAMBOCOR Take 50 mg by mouth 2 (two) times daily.   furosemide 20 MG tablet Commonly known as: LASIX Take 20 mg by mouth daily.   hydrocortisone 25 MG suppository Commonly known as: ANUSOL-HC Place 25 mg rectally 2 (two) times daily as needed for hemorrhoids.  ipratropium 0.06 % nasal spray Commonly known as: ATROVENT Place 2 sprays into both nostrils 4 (four) times daily as needed for rhinitis.   levETIRAcetam 500 MG tablet Commonly known as: KEPPRA Take 1 tablet (500 mg total) by mouth 2 (two) times daily.   levothyroxine 112 MCG tablet Commonly known as: SYNTHROID Take 1 tablet (112 mcg total) by mouth daily. What  changed:   how much to take  when to take this  additional instructions   losartan 25 MG tablet Commonly known as: COZAAR Take 25 mg by mouth every other day.   metoprolol tartrate 25 MG tablet Commonly known as: LOPRESSOR Take 1 tablet (25 mg total) by mouth daily. What changed:   medication strength  when to take this   multivitamin capsule Take 1 capsule by mouth daily.   Myrbetriq 25 MG Tb24 tablet Generic drug: mirabegron ER Take 25 mg by mouth daily.   omeprazole 40 MG capsule Commonly known as: PRILOSEC Take 40 mg by mouth 2 (two) times daily.   polyethylene glycol 17 g packet Commonly known as: MIRALAX / GLYCOLAX Take 17 g by mouth daily.   vitamin C 500 MG tablet Commonly known as: ASCORBIC ACID Take 500 mg by mouth daily.       Follow-up Information    Darrek Leasure C, DO Follow up in 1 month(s).   Contact information: [address] [place] [address] [phone]               Signed: Theodoro Doing Nancy Zamora 07/28/2020, 8:06 AM

## 2020-07-28 NOTE — Progress Notes (Signed)
Occupational Therapy Treatment Patient Details Name: Nancy Zamora MRN: [id number] DOB: 1930-11-24 Today's Date: 07/28/2020    History of present illness This 87 y.o. female admitted after sustaining a fall.  CT of head showed intraparenchymal hemorrhage Rt inferior frontal lobe.  Repeat CT scan showed marked progression of the bleed and underwent reversal of Eliquis.  PMH includes:  h/o CVA with residual Lt sided weakness, SVT, osteoporosis, HTN, AFib with rapid ventricular response   OT comments  Pt received supine in bed with daughter present.  Pt reporting needing to void bowels. Pt required MAX A +1 to pivot to BSC, total A for pericare in standing. Pt able to increase activity tolerance by completing x2 stand pivot transfer and ~ 2 ft of functional mobility. Pt continues to present with generalized weakness, impaired balance, and impaired motor planning needing assist to advance LLE forward during ambulation and physical assist to shift hips anteriorly to come into full stand. Noted pt now for SNF placement, will let OTR know about change in POC. Will continue to follow acutely per POC.    Follow Up Recommendations  CIR    Equipment Recommendations  3 in 1 bedside commode;Other (comment) (RW)    Recommendations for Other Services      Precautions / Restrictions Precautions Precautions: Fall Precaution Comments: residual L-sided weakness from prior stroke/ hx L shoulder fx with pending workup outpatient per family Restrictions Weight Bearing Restrictions: No Other Position/Activity Restrictions: pt with very limited ROM to L shoulder (due to pain/weakness)       Mobility Bed Mobility Overal bed mobility: Needs Assistance Bed Mobility: Supine to Sit     Supine to sit: Mod assist     General bed mobility comments: pt able to advance BLEs to EOB but requried MODA to elevate trunk and scoot hips to EOB  Transfers Overall transfer level: Needs assistance Equipment used:  Rolling walker (2 wheeled);1 person hand held assist Transfers: Sit to/from [facility] Sit to Stand: Mod assist Stand pivot transfers: Max assist       General transfer comment: pt completed several sit<>stands from recliner and BSC with MODA to power up and shift weight anteriorly in standing d/t heavy posterior lean. MAX A +1 for stand pivot transfer needing assist to shift weight laterally to initiate pivotal steps most assist needed to advance LLE during mobility    Balance Overall balance assessment: Needs assistance Sitting-balance support: Bilateral upper extremity supported;Feet supported Sitting balance-Leahy Scale: Poor Sitting balance - Comments: able to sit at edge of recliner with BUE supported with minguard Postural control: Posterior lean Standing balance support: Bilateral upper extremity supported;During functional activity Standing balance-Leahy Scale: Poor Standing balance comment: reliant on external assist d/t posterior lean                           ADL either performed or assessed with clinical judgement   ADL Overall ADL's : Needs assistance/impaired             Lower Body Bathing: Total assistance;Sit to/from stand Lower Body Bathing Details (indicate cue type and reason): simulated via posterior pericare in standing     Lower Body Dressing: Total assistance;Sitting/lateral leans Lower Body Dressing Details (indicate cue type and reason): to don shoes Toilet Transfer: Maximal assistance;BSC Toilet Transfer Details (indicate cue type and reason): MAX A +1 for stand pivot transfer to [place] and Hygiene: Total assistance;+2 for physical assistance;+2 for safety/equipment;Sit to/from  stand Toileting - Clothing Manipulation Details (indicate cue type and reason): psoterior pericare in standing, one person to assist with standing and one for pericare     Functional mobility during ADLs: Moderate  assistance;Maximal assistance;+2 for physical assistance;+2 for safety/equipment;Rolling walker General ADL Comments: pt able to increase activity tolerance this session by completing x2 stand pivot transfers and working on functional mobility and BADL reeducation     Vision Baseline Vision/History: Wears glasses Wears Glasses: At all times Patient Visual Report: No change from baseline     Perception     Praxis      Cognition Arousal/Alertness: Awake/alert Behavior During Therapy: WFL for tasks assessed/performed Overall Cognitive Status: Impaired/Different from baseline Area of Impairment: Safety/judgement;Awareness;Problem solving;Attention                   Current Attention Level: Focused     Safety/Judgement: Decreased awareness of deficits Awareness: Intellectual Problem Solving: Slow processing;Decreased initiation;Requires tactile cues General Comments: pt continues to present with mild cognitive impairements with pt noted to be slow to process needing increased time to follow commands. pt with impaired awareness as pt unaware when LUE drops from RW. pt requires tactile cues at times for mobility tasks        Exercises     Shoulder Instructions       General Comments VSS pts daughter present during session, very helpful and supportive    Pertinent Vitals/ Pain       Pain Assessment: Faces Faces Pain Scale: Hurts a little bit Pain Location: back of head Pain Descriptors / Indicators: Discomfort;Grimacing Pain Intervention(s): Limited activity within patient's tolerance;Monitored during session;Repositioned  Home Living                                          Prior Functioning/Environment              Frequency  Min 2X/week        Progress Toward Goals  OT Goals(current goals can now be found in the care plan section)  Progress towards OT goals: Progressing toward goals  Acute Rehab OT Goals Patient Stated Goal: to  not be a burden OT Goal Formulation: With patient/family Time For Goal Achievement: 07/31/20 Potential to Achieve Goals: Good  Plan Discharge plan remains appropriate;Frequency remains appropriate    Co-evaluation                 AM-PAC OT "6 Clicks" Daily Activity     Outcome Measure   Help from another person eating meals?: None Help from another person taking care of personal grooming?: A Little Help from another person toileting, which includes using toliet, bedpan, or urinal?: A Lot Help from another person bathing (including washing, rinsing, drying)?: A Lot Help from another person to put on and taking off regular upper body clothing?: A Lot Help from another person to put on and taking off regular lower body clothing?: Total 6 Click Score: 14    End of Session Equipment Utilized During Treatment: Gait belt;Rolling walker;Other (comment) (BSC)  OT Visit Diagnosis: Unsteadiness on feet (R26.81);Cognitive communication deficit (R41.841)   Activity Tolerance Patient tolerated treatment well   Patient Left in chair;with call bell/phone within reach;with family/visitor present   Nurse Communication Mobility status        Time: 1660-6301 OT Time Calculation (min): 42 min  Charges: OT General Charges $OT Visit:  1 Visit OT Treatments $Self Care/Home Management : 38-52 mins  Harley Alto., COTA/L Acute Rehabilitation Services [phone]    Precious Haws 07/28/2020, 3:26 PM

## 2020-07-28 NOTE — Progress Notes (Signed)
Report given to [place], the nurse of [place] at [place].

## 2020-07-29 DIAGNOSIS — R569 Unspecified convulsions: Secondary | ICD-10-CM | POA: Diagnosis not present

## 2020-07-29 DIAGNOSIS — Z7409 Other reduced mobility: Secondary | ICD-10-CM | POA: Diagnosis not present

## 2020-07-29 DIAGNOSIS — M25512 Pain in left shoulder: Secondary | ICD-10-CM | POA: Diagnosis not present

## 2020-07-29 DIAGNOSIS — S066X9D Traumatic subarachnoid hemorrhage with loss of consciousness of unspecified duration, subsequent encounter: Secondary | ICD-10-CM | POA: Diagnosis not present

## 2020-07-29 DIAGNOSIS — R58 Hemorrhage, not elsewhere classified: Secondary | ICD-10-CM | POA: Diagnosis not present

## 2020-07-29 DIAGNOSIS — I69398 Other sequelae of cerebral infarction: Secondary | ICD-10-CM | POA: Diagnosis not present

## 2020-07-29 DIAGNOSIS — Z9181 History of falling: Secondary | ICD-10-CM | POA: Diagnosis not present

## 2020-07-29 DIAGNOSIS — S06360A Traumatic hemorrhage of cerebrum, unspecified, without loss of consciousness, initial encounter: Secondary | ICD-10-CM | POA: Diagnosis not present

## 2020-07-29 DIAGNOSIS — R0902 Hypoxemia: Secondary | ICD-10-CM | POA: Diagnosis not present

## 2020-07-29 DIAGNOSIS — E039 Hypothyroidism, unspecified: Secondary | ICD-10-CM | POA: Diagnosis not present

## 2020-07-29 DIAGNOSIS — R519 Headache, unspecified: Secondary | ICD-10-CM | POA: Diagnosis not present

## 2020-07-29 DIAGNOSIS — R561 Post traumatic seizures: Secondary | ICD-10-CM | POA: Diagnosis not present

## 2020-07-29 DIAGNOSIS — Z789 Other specified health status: Secondary | ICD-10-CM | POA: Diagnosis not present

## 2020-07-29 DIAGNOSIS — K219 Gastro-esophageal reflux disease without esophagitis: Secondary | ICD-10-CM | POA: Diagnosis not present

## 2020-07-29 DIAGNOSIS — R509 Fever, unspecified: Secondary | ICD-10-CM | POA: Diagnosis not present

## 2020-07-29 DIAGNOSIS — I482 Chronic atrial fibrillation, unspecified: Secondary | ICD-10-CM | POA: Diagnosis not present

## 2020-07-29 DIAGNOSIS — M2550 Pain in unspecified joint: Secondary | ICD-10-CM | POA: Diagnosis not present

## 2020-07-29 DIAGNOSIS — M6281 Muscle weakness (generalized): Secondary | ICD-10-CM | POA: Diagnosis not present

## 2020-07-29 DIAGNOSIS — S06340D Traumatic hemorrhage of right cerebrum without loss of consciousness, subsequent encounter: Secondary | ICD-10-CM | POA: Diagnosis not present

## 2020-07-29 DIAGNOSIS — I10 Essential (primary) hypertension: Secondary | ICD-10-CM | POA: Diagnosis not present

## 2020-07-29 DIAGNOSIS — Z7401 Bed confinement status: Secondary | ICD-10-CM | POA: Diagnosis not present

## 2020-07-29 DIAGNOSIS — M170 Bilateral primary osteoarthritis of knee: Secondary | ICD-10-CM | POA: Diagnosis not present

## 2020-07-29 DIAGNOSIS — W19XXXD Unspecified fall, subsequent encounter: Secondary | ICD-10-CM | POA: Diagnosis not present

## 2020-07-29 DIAGNOSIS — I480 Paroxysmal atrial fibrillation: Secondary | ICD-10-CM | POA: Diagnosis not present

## 2020-07-29 DIAGNOSIS — G8929 Other chronic pain: Secondary | ICD-10-CM | POA: Diagnosis not present

## 2020-07-29 DIAGNOSIS — U071 COVID-19: Secondary | ICD-10-CM | POA: Diagnosis not present

## 2020-07-29 DIAGNOSIS — R059 Cough, unspecified: Secondary | ICD-10-CM | POA: Diagnosis not present

## 2020-07-29 DIAGNOSIS — S06369D Traumatic hemorrhage of cerebrum, unspecified, with loss of consciousness of unspecified duration, subsequent encounter: Secondary | ICD-10-CM | POA: Diagnosis not present

## 2020-07-29 DIAGNOSIS — M12811 Other specific arthropathies, not elsewhere classified, right shoulder: Secondary | ICD-10-CM | POA: Diagnosis not present

## 2020-07-29 DIAGNOSIS — G44319 Acute post-traumatic headache, not intractable: Secondary | ICD-10-CM | POA: Diagnosis not present

## 2020-07-29 DIAGNOSIS — I679 Cerebrovascular disease, unspecified: Secondary | ICD-10-CM | POA: Diagnosis not present

## 2020-07-29 DIAGNOSIS — M12812 Other specific arthropathies, not elsewhere classified, left shoulder: Secondary | ICD-10-CM | POA: Diagnosis not present

## 2020-07-29 NOTE — Plan of Care (Signed)

## 2020-07-30 DIAGNOSIS — W19XXXD Unspecified fall, subsequent encounter: Secondary | ICD-10-CM | POA: Diagnosis not present

## 2020-07-30 DIAGNOSIS — I10 Essential (primary) hypertension: Secondary | ICD-10-CM | POA: Diagnosis not present

## 2020-07-30 DIAGNOSIS — I482 Chronic atrial fibrillation, unspecified: Secondary | ICD-10-CM | POA: Diagnosis not present

## 2020-07-30 DIAGNOSIS — I679 Cerebrovascular disease, unspecified: Secondary | ICD-10-CM | POA: Diagnosis not present

## 2020-07-30 DIAGNOSIS — S06340D Traumatic hemorrhage of right cerebrum without loss of consciousness, subsequent encounter: Secondary | ICD-10-CM | POA: Diagnosis not present

## 2020-07-30 DIAGNOSIS — R561 Post traumatic seizures: Secondary | ICD-10-CM | POA: Diagnosis not present

## 2020-08-05 DIAGNOSIS — R519 Headache, unspecified: Secondary | ICD-10-CM | POA: Diagnosis not present

## 2020-08-05 DIAGNOSIS — Z7409 Other reduced mobility: Secondary | ICD-10-CM | POA: Diagnosis not present

## 2020-08-05 DIAGNOSIS — Z789 Other specified health status: Secondary | ICD-10-CM | POA: Diagnosis not present

## 2020-08-11 DIAGNOSIS — R509 Fever, unspecified: Secondary | ICD-10-CM | POA: Diagnosis not present

## 2020-08-11 DIAGNOSIS — U071 COVID-19: Secondary | ICD-10-CM | POA: Diagnosis not present

## 2020-08-11 DIAGNOSIS — R059 Cough, unspecified: Secondary | ICD-10-CM | POA: Diagnosis not present

## 2020-08-26 DIAGNOSIS — M170 Bilateral primary osteoarthritis of knee: Secondary | ICD-10-CM | POA: Diagnosis not present

## 2020-08-26 DIAGNOSIS — M12812 Other specific arthropathies, not elsewhere classified, left shoulder: Secondary | ICD-10-CM | POA: Diagnosis not present

## 2020-08-26 DIAGNOSIS — M12811 Other specific arthropathies, not elsewhere classified, right shoulder: Secondary | ICD-10-CM | POA: Diagnosis not present

## 2020-08-30 DIAGNOSIS — S06360D Traumatic hemorrhage of cerebrum, unspecified, without loss of consciousness, subsequent encounter: Secondary | ICD-10-CM | POA: Diagnosis not present

## 2020-09-13 DIAGNOSIS — Z9181 History of falling: Secondary | ICD-10-CM | POA: Diagnosis not present

## 2020-09-13 DIAGNOSIS — M6281 Muscle weakness (generalized): Secondary | ICD-10-CM | POA: Diagnosis not present

## 2020-09-13 DIAGNOSIS — S06369D Traumatic hemorrhage of cerebrum, unspecified, with loss of consciousness of unspecified duration, subsequent encounter: Secondary | ICD-10-CM | POA: Diagnosis not present

## 2020-09-13 DIAGNOSIS — R278 Other lack of coordination: Secondary | ICD-10-CM | POA: Diagnosis not present

## 2020-09-13 DIAGNOSIS — S066X9D Traumatic subarachnoid hemorrhage with loss of consciousness of unspecified duration, subsequent encounter: Secondary | ICD-10-CM | POA: Diagnosis not present

## 2020-09-13 DIAGNOSIS — R2681 Unsteadiness on feet: Secondary | ICD-10-CM | POA: Diagnosis not present

## 2020-09-14 DIAGNOSIS — L821 Other seborrheic keratosis: Secondary | ICD-10-CM | POA: Diagnosis not present

## 2020-09-14 DIAGNOSIS — D1801 Hemangioma of skin and subcutaneous tissue: Secondary | ICD-10-CM | POA: Diagnosis not present

## 2020-09-14 DIAGNOSIS — D692 Other nonthrombocytopenic purpura: Secondary | ICD-10-CM | POA: Diagnosis not present

## 2020-09-14 DIAGNOSIS — L820 Inflamed seborrheic keratosis: Secondary | ICD-10-CM | POA: Diagnosis not present

## 2020-09-14 DIAGNOSIS — L570 Actinic keratosis: Secondary | ICD-10-CM | POA: Diagnosis not present

## 2020-09-14 DIAGNOSIS — L814 Other melanin hyperpigmentation: Secondary | ICD-10-CM | POA: Diagnosis not present

## 2020-09-15 DIAGNOSIS — M6281 Muscle weakness (generalized): Secondary | ICD-10-CM | POA: Diagnosis not present

## 2020-09-15 DIAGNOSIS — S06369D Traumatic hemorrhage of cerebrum, unspecified, with loss of consciousness of unspecified duration, subsequent encounter: Secondary | ICD-10-CM | POA: Diagnosis not present

## 2020-09-15 DIAGNOSIS — Z9181 History of falling: Secondary | ICD-10-CM | POA: Diagnosis not present

## 2020-09-15 DIAGNOSIS — R2681 Unsteadiness on feet: Secondary | ICD-10-CM | POA: Diagnosis not present

## 2020-09-15 DIAGNOSIS — R278 Other lack of coordination: Secondary | ICD-10-CM | POA: Diagnosis not present

## 2020-09-15 DIAGNOSIS — S066X9D Traumatic subarachnoid hemorrhage with loss of consciousness of unspecified duration, subsequent encounter: Secondary | ICD-10-CM | POA: Diagnosis not present

## 2020-10-06 DIAGNOSIS — R300 Dysuria: Secondary | ICD-10-CM | POA: Diagnosis not present

## 2020-10-06 DIAGNOSIS — D649 Anemia, unspecified: Secondary | ICD-10-CM | POA: Diagnosis not present

## 2020-10-06 DIAGNOSIS — I10 Essential (primary) hypertension: Secondary | ICD-10-CM | POA: Diagnosis not present

## 2020-10-19 DIAGNOSIS — L570 Actinic keratosis: Secondary | ICD-10-CM | POA: Diagnosis not present

## 2020-10-19 DIAGNOSIS — D692 Other nonthrombocytopenic purpura: Secondary | ICD-10-CM | POA: Diagnosis not present

## 2020-10-19 DIAGNOSIS — L814 Other melanin hyperpigmentation: Secondary | ICD-10-CM | POA: Diagnosis not present

## 2020-10-19 DIAGNOSIS — L821 Other seborrheic keratosis: Secondary | ICD-10-CM | POA: Diagnosis not present

## 2020-11-03 ENCOUNTER — Ambulatory Visit: Payer: PPO | Admitting: Adult Health

## 2020-11-10 DIAGNOSIS — E039 Hypothyroidism, unspecified: Secondary | ICD-10-CM | POA: Diagnosis not present

## 2020-11-10 DIAGNOSIS — I6930 Unspecified sequelae of cerebral infarction: Secondary | ICD-10-CM | POA: Diagnosis not present

## 2020-11-10 DIAGNOSIS — W19XXXD Unspecified fall, subsequent encounter: Secondary | ICD-10-CM | POA: Diagnosis not present

## 2020-11-10 DIAGNOSIS — I482 Chronic atrial fibrillation, unspecified: Secondary | ICD-10-CM | POA: Diagnosis not present

## 2020-11-10 DIAGNOSIS — M159 Polyosteoarthritis, unspecified: Secondary | ICD-10-CM | POA: Diagnosis not present

## 2020-11-10 DIAGNOSIS — E785 Hyperlipidemia, unspecified: Secondary | ICD-10-CM | POA: Diagnosis not present

## 2020-11-10 DIAGNOSIS — K219 Gastro-esophageal reflux disease without esophagitis: Secondary | ICD-10-CM | POA: Diagnosis not present

## 2020-11-10 DIAGNOSIS — I10 Essential (primary) hypertension: Secondary | ICD-10-CM | POA: Diagnosis not present

## 2020-11-10 DIAGNOSIS — F32A Depression, unspecified: Secondary | ICD-10-CM | POA: Diagnosis not present

## 2020-11-10 DIAGNOSIS — R569 Unspecified convulsions: Secondary | ICD-10-CM | POA: Diagnosis not present

## 2020-11-12 DIAGNOSIS — R2231 Localized swelling, mass and lump, right upper limb: Secondary | ICD-10-CM | POA: Diagnosis not present

## 2020-11-12 DIAGNOSIS — D649 Anemia, unspecified: Secondary | ICD-10-CM | POA: Diagnosis not present

## 2020-11-17 DIAGNOSIS — M79641 Pain in right hand: Secondary | ICD-10-CM | POA: Diagnosis not present

## 2020-11-17 DIAGNOSIS — M159 Polyosteoarthritis, unspecified: Secondary | ICD-10-CM | POA: Diagnosis not present

## 2020-11-23 DIAGNOSIS — Z888 Allergy status to other drugs, medicaments and biological substances status: Secondary | ICD-10-CM | POA: Diagnosis not present

## 2020-11-23 DIAGNOSIS — M12812 Other specific arthropathies, not elsewhere classified, left shoulder: Secondary | ICD-10-CM | POA: Diagnosis not present

## 2020-11-23 DIAGNOSIS — M170 Bilateral primary osteoarthritis of knee: Secondary | ICD-10-CM | POA: Diagnosis not present

## 2020-11-23 DIAGNOSIS — Z880 Allergy status to penicillin: Secondary | ICD-10-CM | POA: Diagnosis not present

## 2020-11-23 DIAGNOSIS — M1811 Unilateral primary osteoarthritis of first carpometacarpal joint, right hand: Secondary | ICD-10-CM | POA: Diagnosis not present

## 2020-11-23 DIAGNOSIS — Z881 Allergy status to other antibiotic agents status: Secondary | ICD-10-CM | POA: Diagnosis not present

## 2020-11-23 DIAGNOSIS — M12811 Other specific arthropathies, not elsewhere classified, right shoulder: Secondary | ICD-10-CM | POA: Diagnosis not present

## 2020-11-23 DIAGNOSIS — Z885 Allergy status to narcotic agent status: Secondary | ICD-10-CM | POA: Diagnosis not present

## 2020-12-21 DIAGNOSIS — M19011 Primary osteoarthritis, right shoulder: Secondary | ICD-10-CM | POA: Diagnosis not present

## 2020-12-21 DIAGNOSIS — M19012 Primary osteoarthritis, left shoulder: Secondary | ICD-10-CM | POA: Diagnosis not present

## 2020-12-21 DIAGNOSIS — M170 Bilateral primary osteoarthritis of knee: Secondary | ICD-10-CM | POA: Diagnosis not present

## 2021-01-10 DIAGNOSIS — E039 Hypothyroidism, unspecified: Secondary | ICD-10-CM | POA: Diagnosis not present

## 2021-01-10 DIAGNOSIS — R569 Unspecified convulsions: Secondary | ICD-10-CM | POA: Diagnosis not present

## 2021-01-10 DIAGNOSIS — F32A Depression, unspecified: Secondary | ICD-10-CM | POA: Diagnosis not present

## 2021-01-10 DIAGNOSIS — I10 Essential (primary) hypertension: Secondary | ICD-10-CM | POA: Diagnosis not present

## 2021-01-10 DIAGNOSIS — Z7409 Other reduced mobility: Secondary | ICD-10-CM | POA: Diagnosis not present

## 2021-01-10 DIAGNOSIS — I482 Chronic atrial fibrillation, unspecified: Secondary | ICD-10-CM | POA: Diagnosis not present

## 2021-01-10 DIAGNOSIS — I639 Cerebral infarction, unspecified: Secondary | ICD-10-CM | POA: Diagnosis not present

## 2021-01-10 DIAGNOSIS — F419 Anxiety disorder, unspecified: Secondary | ICD-10-CM | POA: Diagnosis not present

## 2021-01-10 DIAGNOSIS — K219 Gastro-esophageal reflux disease without esophagitis: Secondary | ICD-10-CM | POA: Diagnosis not present

## 2021-01-11 DIAGNOSIS — F4323 Adjustment disorder with mixed anxiety and depressed mood: Secondary | ICD-10-CM | POA: Diagnosis not present

## 2021-01-11 DIAGNOSIS — R419 Unspecified symptoms and signs involving cognitive functions and awareness: Secondary | ICD-10-CM | POA: Diagnosis not present

## 2021-01-11 DIAGNOSIS — F331 Major depressive disorder, recurrent, moderate: Secondary | ICD-10-CM | POA: Diagnosis not present

## 2021-01-18 DIAGNOSIS — L570 Actinic keratosis: Secondary | ICD-10-CM | POA: Diagnosis not present

## 2021-01-18 DIAGNOSIS — D692 Other nonthrombocytopenic purpura: Secondary | ICD-10-CM | POA: Diagnosis not present

## 2021-01-18 DIAGNOSIS — L814 Other melanin hyperpigmentation: Secondary | ICD-10-CM | POA: Diagnosis not present

## 2021-01-18 DIAGNOSIS — L821 Other seborrheic keratosis: Secondary | ICD-10-CM | POA: Diagnosis not present

## 2021-01-20 DIAGNOSIS — N390 Urinary tract infection, site not specified: Secondary | ICD-10-CM | POA: Diagnosis not present

## 2021-01-25 DIAGNOSIS — F4323 Adjustment disorder with mixed anxiety and depressed mood: Secondary | ICD-10-CM | POA: Diagnosis not present

## 2021-01-25 DIAGNOSIS — R419 Unspecified symptoms and signs involving cognitive functions and awareness: Secondary | ICD-10-CM | POA: Diagnosis not present

## 2021-01-25 DIAGNOSIS — F331 Major depressive disorder, recurrent, moderate: Secondary | ICD-10-CM | POA: Diagnosis not present

## 2021-02-15 ENCOUNTER — Ambulatory Visit: Payer: PPO | Admitting: Adult Health

## 2021-02-15 ENCOUNTER — Encounter: Payer: Self-pay | Admitting: Adult Health

## 2021-02-15 DIAGNOSIS — I480 Paroxysmal atrial fibrillation: Secondary | ICD-10-CM | POA: Diagnosis not present

## 2021-02-15 DIAGNOSIS — E782 Mixed hyperlipidemia: Secondary | ICD-10-CM

## 2021-02-15 DIAGNOSIS — R569 Unspecified convulsions: Secondary | ICD-10-CM | POA: Diagnosis not present

## 2021-02-15 DIAGNOSIS — I10 Essential (primary) hypertension: Secondary | ICD-10-CM | POA: Diagnosis not present

## 2021-02-15 DIAGNOSIS — I63411 Cerebral infarction due to embolism of right middle cerebral artery: Secondary | ICD-10-CM | POA: Diagnosis not present

## 2021-02-15 DIAGNOSIS — R58 Hemorrhage, not elsewhere classified: Secondary | ICD-10-CM

## 2021-02-15 DIAGNOSIS — I69319 Unspecified symptoms and signs involving cognitive functions following cerebral infarction: Secondary | ICD-10-CM | POA: Diagnosis not present

## 2021-02-15 MED ORDER — DIVALPROEX SODIUM 125 MG PO DR TAB
125.0000 mg | DELAYED_RELEASE_TABLET | Freq: Every day | ORAL | 5 refills | Status: AC
Start: 2021-02-15 — End: ?

## 2021-02-15 NOTE — Patient Instructions (Addendum)
Start depakote '125mg'$  nightly for headaches worsened post bleed - this can also help with sundowning behaviors   May consider restarting Eliquis for stroke prevention but will further discuss this with Dr. Leonie Man  Continue keppra '500mg'$  twice daily for seizure prevention  Continue atorvastatin '20mg'$  daily  for secondary stroke prevention  Continue to follow up with PCP regarding cholesterol and blood pressure management  Maintain strict control of hypertension with blood pressure goal below 130/90 and cholesterol with LDL cholesterol (bad cholesterol) goal below 70 mg/dL.      Followup in the future with me in 6 months or call earlier if needed      Thank you for coming to see [place] at [facility]. I hope we have been able to provide you high quality care today.  You may receive a patient satisfaction survey over the next few weeks. We would appreciate your feedback and comments so that we may continue to improve ourselves and the health of our patients. Residual headaches residual

## 2021-02-15 NOTE — Progress Notes (Signed)
[facility] [address] [place]. [place] [address] [phone]       STROKE FOLLOW UP NOTE  [name] Date of Birth:  February 02, 1931 Medical Record Number:  [id number]   Reason for Referral: stroke follow up    CHIEF COMPLAINT:  Chief Complaint  Patient presents with   Follow-up    Rm 2 with son Nancy Zamora) here for 6 month f/u. Reports she has been struggling with daily h/a.Sts otc tylenol does not help. Participating in physical therapy has helped.     HPI:  Today, 02/15/2021, [name] returns for overdue 85-monthstroke accompanied by her son. [place] with resultant traumatic right frontal lobe intracranial hemorrhage and small SAH.  Eliquis placed on hold and received Kcentra.  Placed on Keppra for seizure prevention with " multiple episodes of seizures in the emergency department".  Evaluated by PT/OT initially recommending CIR although was declined and discharged to SNF.  She has since been transferred to LTC and continues to receive therapies twice weekly.  She mainly transfers via wheelchair but does ambulate short distance with 2 person assist usually during therapy sessions and has not had any additional falls.  She has also been experiencing more persistent bilateral temporal headaches since that time.  Will occasionally use Tylenol but denies benefit. [facility].  Son is also concerned regarding occasional confusion noted during conversations as well as what sounds like sundowning.  Per review of MAR, [facility] but has remained on atorvastatin.  She is also remained on Keppra tolerating without side effect and denies any seizure activity.  She is also currently on Lexapro 10 mg daily for mood stabilization.  No further concerns at this time.    History provided for reference purposes only Update  05/04/2020 JM: [name] being seen for stroke follow-up accompanied by her son.  Reports continued left-sided weakness which has been stable without worsening.  She continues to be followed closely by orthopedics for chronic left shoulder pain.  Continues to use Rollator walker at all times and denies any recent falls.  Denies residual dysarthria but is concerned regarding continued hypophonia.  Continues to receive 24/7 care at home for assistance with ADLs and IADLs.  Denies new or worsening stroke/TIA symptoms.  Remains on Eliquis 2.5 mg twice daily without bruising or bleeding.  Recently evaluated by GI for 2 days of hematochezia in September and mild anemia asymptomatic.  No recent hematochezia or evidence of bleeding.  Continues on atorvastatin 20 mg daily without myalgias.  Blood pressure today 126/70.  Monitors at home which has been stable.  No further concerns at this time.  Update 11/04/2019 JM:, Ms. M[place]insreturns for follow-up regarding right MCA stroke in 03/2019.  Residual deficits of mild left hemiparesis and mild dysarthria with hypophonia.  Continues to ambulate with Rollator walker and denies any recent falls.  Continues to experience chronic left shoulder pain with limited mobility and weakness which has returned to baseline.  Previously seen by orthopedics but has not had follow-up post stroke.  Continues on Eliquis 2.5 mg twice daily and atorvastatin for secondary stroke prevention.  Blood pressure today 134/82.  Repeat CTA showed interval recanalization of previously occluded right M2 branch vessel with unchanged mild proximal left M2 and severe proximal right P3 stenosis.  No concerns at this time.  Initial visit 07/15/2019  JM: [name] who is being seen today for hospital follow-up accompanied by her daughter.  Residual deficits of left hemiparesis, left facial droop and mild dysarthria.  She was discharged from SNF on 07/06/2019 return to her own home where she  is currently receiving 24-hour care as well as home health PT/OT.  She was independent prior to her stroke but currently receiving 24-hour care due to safety concerns.  She is able to ambulate with Rollator walker and denies any recent falls.  History of chronic dislocated left shoulder causing increased pain and interfering with recovery.  Daughter is questioning potential benefit of Botox injections as this was discussed with the family member.  Patient and daughter are questioning repeating imaging due to concern of prior occlusion seen on imaging during hospitalization.  She has continued on Eliquis 2.5 mg twice daily without bleeding or bruising.  She does have established cardiologist but has not had follow-up at this time.  Continues on atorvastatin 20 mg daily without myalgias.  Blood pressure today satisfactory at 125/80.  No further concerns at this time.  Stroke admission 03/26/2019: [name] is a 87 y.o. female with history of HTN, CKD, AV stenosis, PSVT/PVCs, hypothyroidism and peripheral neuropathy presented on 03/26/2019 following a fall with slurred speech, left sided facial droop, right gaze preference and left arm weakness.  Stroke work-up showed right MCA infarct as evidenced on MRI due to right distal M2 occlusion as evidenced on CTA embolic pattern secondary to PAF.  Underwent cerebral angiogram which showed occlusion right MCA M2/M3 junction.  2D echo showed an EF greater than 65% without cardiac source of embolus identified.  LE venous Doppler negative for DVT.  History of SVT with PAF and RVR found on telemetry and recommended initiating Eliquis 2.5 mg twice daily.  HTN stable with transient hypotension with sleep and recommended long-term BP goal normotensive range.  LDL 88 and initiated atorvastatin 20 mg daily.  Other stroke risk factors include advanced age, family history of stroke and nonrheumatic AV stenosis but no prior history of stroke.  Other active problems trop 58,  hypothyroidism, osteoporosis, leukocytosis, chest x-ray showed small effusion/atelectasis, left humeral head chronically dislocated, left elbow joint effusion w/p acute bony abnormality, GERD on PPI, constipation and use of Celebrex with patient understanding increased risk of bleeding on DOAC.  Residual deficits include dysphagia, mild dysarthria, left facial droop, incomplete left gaze, and left hemiparesis in therapies recommended discharge to SNF for ongoing therapy.     ROS:   14 system review of systems performed and negative with exception of those listed in HPI  PMH:  Past Medical History:  Diagnosis Date   Atrial fibrillation with rapid ventricular response (HCC)    CVA (cerebral vascular accident) ([place])    Dysphagia    Hypertension    Osteoporosis    SVT (supraventricular tachycardia) (HCC)     PSH:  Past Surgical History:  Procedure Laterality Date   IR ANGIO INTRA EXTRACRAN SEL COM CAROTID INNOMINATE UNI R MOD SED  03/26/2019   IR ANGIO VERTEBRAL SEL SUBCLAVIAN INNOMINATE UNI R MOD SED  03/26/2019   RADIOLOGY WITH ANESTHESIA Left 03/26/2019   Procedure: RADIOLOGY WITH ANESTHESIA;  Surgeon: Luanne Bras, MD;  Location: [facility];  Service: Radiology;  Laterality: Left;    Social History:  Social History   Socioeconomic History   Marital status: Widowed    Spouse name: Not on file   Number of children: Not on file  Years of education: Not on file   Highest education level: Not on file  Occupational History   Not on file  Tobacco Use   Smoking status: Never   Smokeless tobacco: Never  Substance and Sexual Activity   Alcohol use: Not on file   Drug use: Not on file   Sexual activity: Not on file  Other Topics Concern   Not on file  Social History Narrative   Not on file   Social Determinants of Health   Financial Resource Strain: Not on file  Food Insecurity: Not on file  Transportation Needs: Not on file  Physical Activity: Not on file  Stress: Not on  file  Social Connections: Not on file  Intimate Partner Violence: Not on file    Family History: No family history on file.  Medications:   Current Outpatient Medications on File Prior to Visit  Medication Sig Dispense Refill   acyclovir (ZOVIRAX) 800 MG tablet Take 800 mg by mouth 3 (three) times daily as needed (flare up).     atorvastatin (LIPITOR) 20 MG tablet Take 1 tablet (20 mg total) by mouth daily at 6 PM.     butalbital-acetaminophen-caffeine (FIORICET) 50-325-40 MG tablet Take 1 tablet by mouth every 4 (four) hours as needed for headache. 28 tablet 0   calcium-vitamin D (OSCAL WITH D) 500-200 MG-UNIT TABS tablet Take 1 tablet by mouth daily.     Carboxymethylcell-Hypromellose 0.25-0.3 % GEL Place 1 drop into both eyes daily.     Cholecalciferol 25 MCG (1000 UT) tablet Take 1,000 Units by mouth daily.     diclofenac Sodium (VOLTAREN) 1 % GEL Apply 2 g topically 3 (three) times daily. Shoulders and knees     escitalopram (LEXAPRO) 5 MG tablet Take 5 mg by mouth daily.     fexofenadine (ALLEGRA) 180 MG tablet Take 180 mg by mouth daily.     flecainide (TAMBOCOR) 50 MG tablet Take 50 mg by mouth 2 (two) times daily.     furosemide (LASIX) 20 MG tablet Take 20 mg by mouth daily.     hydrocortisone (ANUSOL-HC) 25 MG suppository Place 25 mg rectally 2 (two) times daily as needed for hemorrhoids.     ipratropium (ATROVENT) 0.06 % nasal spray Place 2 sprays into both nostrils 4 (four) times daily as needed for rhinitis.     levETIRAcetam (KEPPRA) 500 MG tablet Take 1 tablet (500 mg total) by mouth 2 (two) times daily. 60 tablet 6   levothyroxine (SYNTHROID) 112 MCG tablet Take 1 tablet (112 mcg total) by mouth daily. (Patient taking differently: Take 112-168 mcg by mouth See admin instructions. Takes 1.5 tablets (168 mcg totally) by mouth on Mon and Tues; takes 1 tablet (112 mcg totally) by mouth on other days)     losartan (COZAAR) 25 MG tablet Take 25 mg by mouth every other day.      metoprolol tartrate (LOPRESSOR) 25 MG tablet Take 1 tablet (25 mg total) by mouth daily. 60 tablet 1   Multiple Vitamin (MULTIVITAMIN) capsule Take 1 capsule by mouth daily.     MYRBETRIQ 25 MG TB24 tablet Take 25 mg by mouth daily.     omeprazole (PRILOSEC) 40 MG capsule Take 40 mg by mouth 2 (two) times daily.     polyethylene glycol (MIRALAX / GLYCOLAX) 17 g packet Take 17 g by mouth daily.     vitamin C (ASCORBIC ACID) 500 MG tablet Take 500 mg by mouth daily.     No current  facility-administered medications on file prior to visit.    Allergies:   Allergies  Allergen Reactions   Penicillins Hives   Tetracyclines & Related Hives   Chocolate Other (See Comments)    Mouth ulcers   Codeine Other (See Comments)    "makes me feel woozy"   Gabapentin Nausea And Vomiting   Tape Rash    Tears skin     Physical Exam  General: Frail very pleasant elderly Caucasian female, seated, in no evident distress Head: head normocephalic and atraumatic.   Neck: supple with no carotid or supraclavicular bruits Cardiovascular: irregular rate and rhythm, no murmurs Musculoskeletal: no deformity Skin:  no rash/petichiae Vascular:  Normal pulses all extremities   Neurologic Exam Mental Status: Awake and fully alert. Mild hypophonia (chronic). No evidence of slurring or word finding difficulty.  Oriented to place and time. Recent memory impaired and remote memory intact. Attention span, concentration and fund of knowledge impaired. Mood and affect appropriate.  Cranial Nerves: Pupils equal, briskly reactive to light. Extraocular movements full without nystagmus. Visual fields full to confrontation. Hearing intact. Facial sensation intact.  Mild left lower facial weakness Motor: Mild left hemiparesis (chronic).  Full strength right upper and lower extremity. Sensory.: intact to touch , pinprick , position and vibratory sensation.  Coordination: Rapid alternating movements normal in all extremities  except mild decreased left finger tapping. Finger-to-nose and heel-to-shin performed adequately right side Gait and Station: Deferred Reflexes: 1+ and symmetric. Toes downgoing.         ASSESSMENT: Nancy Zamora is a 87 y.o. year old female with history of right MCA infarct in setting of right distal M2 occlusion on 03/26/2019 secondary to new onset PAF with residual left hemiparesis.  She unfortunately had a fall on 07/16/2020 which resulted in right frontal  IPH and small SAH as well as seizures.  Vascular risk factors include PAF with RVR, history of SVT, HTN, HLD, advanced age, family history of stroke and nonrheumatic AV stenosis.     PLAN:  Traumatic hemorrhage:  Currently residing at [place] since 06/2020.  Eliquis discontinued at that time.  Discussed risk vs benefit of restarting AC in setting of prior ischemic stroke secondary to A. fib not on AC. Will further discuss with Dr. Leonie Man for further input regarding potentially restarting AC vs aspirin for secondary stroke prevention.  Son wishes not to pursue additional imaging unless absolutely needed C/o resultant headaches - start Depakote 125 mg nightly which will hopefully help with both headaches, sundowning behaviors and insomnia Right MCA stroke:  Continue Eliquis (apixaban) daily  and atorvastatin for secondary stroke prevention.  Discussed secondary stroke prevention measures and importance of close PCP follow-up for aggressive stroke risk factor management Seizures, post IPH:  Continue Keppra 500 mg twice daily for seizure prevention Cognitive impairment:  Cognitive impairment present after prior R MCA stroke - worsened after traumatic hemorrhage possibly progressed to mild-mod dementia.   Initiate Depakote 125 mg nightly to further help with sundowning behaviors (see above).  May consider use of Namenda or Aricept in the future if indicated.  MMSE not completed due to time constraints - plan to complete at f/u  visit PAF:  Currently off AC since traumatic hemorrhage (see #1) HTN:  BP goal<130/90.  Stable.  Managed by cardiology/PCP HLD:  LDL goal<70.  Continue atorvastatin and routine monitoring and rx management by PCP    Follow up in 6 months or call earlier if needed   CC:  [facility] provider: Dr. Leonie Man  Kristopher Glee., MD     I spent 48 minutes of face-to-face and non-face-to-face time with patient and son.  This included previsit chart review, lab review, study review, order entry, electronic health record documentation, patient and son education and discussion regarding traumatic hemorrhage and review of hospitalization, residual headaches with further treatment options including risk versus benefit of medication use and potential side effects, history of right MCA stroke secondary to new diagnosis of PAF, hx of PAC currently off AC and risk vs benefit of restarting, cog impairment with occasional statement inaccuracies, secondary stroke prevention measures and aggressive stroke risk factor management and answered all other questions to patient and son's satisfaction  Frann Rider, AGNP-BC  [facility] [address] [place], [place] [address]  Phone [phone] Fax [phone] Note: This document was prepared with digital dictation and possible smart phrase technology. Any transcriptional errors that result from this process are unintentional.

## 2021-02-17 ENCOUNTER — Encounter: Payer: Self-pay | Admitting: Adult Health

## 2021-02-17 NOTE — Progress Notes (Signed)
I agree with the above plan 

## 2021-02-21 ENCOUNTER — Telehealth: Payer: Self-pay | Admitting: Adult Health

## 2021-02-21 NOTE — Telephone Encounter (Signed)
I have called the Charge Nurse at [facility] and left a vm asking for call back to provide order for 81 mg Asprin.

## 2021-02-21 NOTE — Telephone Encounter (Signed)
Further discussed use of anticoagulation with Dr. Leonie Man for hx of A. Fib on Eliquis which was d/c'd after traumatic hemorrhage in 06/2020. Per Dr. Leonie Man "I would start aspirin 81 mg daily.  No need for repeat CT scan unless there is an acute neurological change.  Given patient's advanced age I do not think Eliquis would be appropriate".   Can we please notify facility to start aspirin '81mg'$  daily for secondary stroke prevention measures.  Thank you.

## 2021-02-22 NOTE — Telephone Encounter (Signed)
Received call from [place] at [facility]; gave her VO per NP to begin ASA 81 mg oral daily. She repeated VO, verbalized understanding, appreciation.

## 2021-02-22 NOTE — Telephone Encounter (Signed)
I called the charge nurse and left a vm again for a cb to review the order.

## 2021-03-01 DIAGNOSIS — R300 Dysuria: Secondary | ICD-10-CM | POA: Diagnosis not present

## 2021-03-01 DIAGNOSIS — N390 Urinary tract infection, site not specified: Secondary | ICD-10-CM | POA: Diagnosis not present

## 2021-03-01 DIAGNOSIS — D649 Anemia, unspecified: Secondary | ICD-10-CM | POA: Diagnosis not present

## 2021-03-01 DIAGNOSIS — I10 Essential (primary) hypertension: Secondary | ICD-10-CM | POA: Diagnosis not present

## 2021-03-02 DIAGNOSIS — E785 Hyperlipidemia, unspecified: Secondary | ICD-10-CM | POA: Diagnosis not present

## 2021-03-02 DIAGNOSIS — Z8673 Personal history of transient ischemic attack (TIA), and cerebral infarction without residual deficits: Secondary | ICD-10-CM | POA: Diagnosis not present

## 2021-03-02 DIAGNOSIS — I10 Essential (primary) hypertension: Secondary | ICD-10-CM | POA: Diagnosis not present

## 2021-03-02 DIAGNOSIS — E039 Hypothyroidism, unspecified: Secondary | ICD-10-CM | POA: Diagnosis not present

## 2021-03-02 DIAGNOSIS — F32A Depression, unspecified: Secondary | ICD-10-CM | POA: Diagnosis not present

## 2021-03-02 DIAGNOSIS — M1990 Unspecified osteoarthritis, unspecified site: Secondary | ICD-10-CM | POA: Diagnosis not present

## 2021-03-02 DIAGNOSIS — I482 Chronic atrial fibrillation, unspecified: Secondary | ICD-10-CM | POA: Diagnosis not present

## 2021-03-02 DIAGNOSIS — G40909 Epilepsy, unspecified, not intractable, without status epilepticus: Secondary | ICD-10-CM | POA: Diagnosis not present

## 2021-03-02 DIAGNOSIS — Z7409 Other reduced mobility: Secondary | ICD-10-CM | POA: Diagnosis not present

## 2021-03-02 DIAGNOSIS — T7840XD Allergy, unspecified, subsequent encounter: Secondary | ICD-10-CM | POA: Diagnosis not present

## 2021-03-02 DIAGNOSIS — K219 Gastro-esophageal reflux disease without esophagitis: Secondary | ICD-10-CM | POA: Diagnosis not present

## 2021-03-08 DIAGNOSIS — F331 Major depressive disorder, recurrent, moderate: Secondary | ICD-10-CM | POA: Diagnosis not present

## 2021-03-08 DIAGNOSIS — F4323 Adjustment disorder with mixed anxiety and depressed mood: Secondary | ICD-10-CM | POA: Diagnosis not present

## 2021-03-08 DIAGNOSIS — R419 Unspecified symptoms and signs involving cognitive functions and awareness: Secondary | ICD-10-CM | POA: Diagnosis not present

## 2021-03-22 DIAGNOSIS — L570 Actinic keratosis: Secondary | ICD-10-CM | POA: Diagnosis not present

## 2021-03-22 DIAGNOSIS — L814 Other melanin hyperpigmentation: Secondary | ICD-10-CM | POA: Diagnosis not present

## 2021-03-22 DIAGNOSIS — L821 Other seborrheic keratosis: Secondary | ICD-10-CM | POA: Diagnosis not present

## 2021-03-24 DIAGNOSIS — M170 Bilateral primary osteoarthritis of knee: Secondary | ICD-10-CM | POA: Diagnosis not present

## 2021-03-24 DIAGNOSIS — M75102 Unspecified rotator cuff tear or rupture of left shoulder, not specified as traumatic: Secondary | ICD-10-CM | POA: Diagnosis not present

## 2021-03-30 DIAGNOSIS — E039 Hypothyroidism, unspecified: Secondary | ICD-10-CM | POA: Diagnosis not present

## 2021-03-30 DIAGNOSIS — E785 Hyperlipidemia, unspecified: Secondary | ICD-10-CM | POA: Diagnosis not present

## 2021-04-05 DIAGNOSIS — F4323 Adjustment disorder with mixed anxiety and depressed mood: Secondary | ICD-10-CM | POA: Diagnosis not present

## 2021-04-05 DIAGNOSIS — F331 Major depressive disorder, recurrent, moderate: Secondary | ICD-10-CM | POA: Diagnosis not present

## 2021-04-05 DIAGNOSIS — R419 Unspecified symptoms and signs involving cognitive functions and awareness: Secondary | ICD-10-CM | POA: Diagnosis not present

## 2021-04-07 DIAGNOSIS — E119 Type 2 diabetes mellitus without complications: Secondary | ICD-10-CM | POA: Diagnosis not present

## 2021-04-15 DIAGNOSIS — M25532 Pain in left wrist: Secondary | ICD-10-CM | POA: Diagnosis not present

## 2021-04-15 DIAGNOSIS — M79622 Pain in left upper arm: Secondary | ICD-10-CM | POA: Diagnosis not present

## 2021-04-15 DIAGNOSIS — M25522 Pain in left elbow: Secondary | ICD-10-CM | POA: Diagnosis not present

## 2021-04-15 DIAGNOSIS — R300 Dysuria: Secondary | ICD-10-CM | POA: Diagnosis not present

## 2021-04-19 DIAGNOSIS — R400 Somnolence: Secondary | ICD-10-CM | POA: Diagnosis not present

## 2021-04-19 DIAGNOSIS — R52 Pain, unspecified: Secondary | ICD-10-CM | POA: Diagnosis not present

## 2021-04-26 DIAGNOSIS — M19012 Primary osteoarthritis, left shoulder: Secondary | ICD-10-CM | POA: Diagnosis not present

## 2021-04-26 DIAGNOSIS — S42212A Unspecified displaced fracture of surgical neck of left humerus, initial encounter for closed fracture: Secondary | ICD-10-CM | POA: Diagnosis not present

## 2021-04-26 DIAGNOSIS — S42202A Unspecified fracture of upper end of left humerus, initial encounter for closed fracture: Secondary | ICD-10-CM | POA: Diagnosis not present

## 2021-04-26 DIAGNOSIS — J8410 Pulmonary fibrosis, unspecified: Secondary | ICD-10-CM | POA: Diagnosis not present

## 2021-04-26 DIAGNOSIS — S4992XA Unspecified injury of left shoulder and upper arm, initial encounter: Secondary | ICD-10-CM | POA: Diagnosis not present

## 2021-04-26 DIAGNOSIS — M8589 Other specified disorders of bone density and structure, multiple sites: Secondary | ICD-10-CM | POA: Diagnosis not present

## 2021-04-26 DIAGNOSIS — M778 Other enthesopathies, not elsewhere classified: Secondary | ICD-10-CM | POA: Diagnosis not present

## 2021-05-09 DIAGNOSIS — G40909 Epilepsy, unspecified, not intractable, without status epilepticus: Secondary | ICD-10-CM | POA: Diagnosis not present

## 2021-05-09 DIAGNOSIS — Z7409 Other reduced mobility: Secondary | ICD-10-CM | POA: Diagnosis not present

## 2021-05-09 DIAGNOSIS — S42202D Unspecified fracture of upper end of left humerus, subsequent encounter for fracture with routine healing: Secondary | ICD-10-CM | POA: Diagnosis not present

## 2021-05-09 DIAGNOSIS — I639 Cerebral infarction, unspecified: Secondary | ICD-10-CM | POA: Diagnosis not present

## 2021-05-09 DIAGNOSIS — Z789 Other specified health status: Secondary | ICD-10-CM | POA: Diagnosis not present

## 2021-05-09 DIAGNOSIS — W19XXXD Unspecified fall, subsequent encounter: Secondary | ICD-10-CM | POA: Diagnosis not present

## 2021-05-25 DIAGNOSIS — S42202D Unspecified fracture of upper end of left humerus, subsequent encounter for fracture with routine healing: Secondary | ICD-10-CM | POA: Diagnosis not present

## 2021-05-25 DIAGNOSIS — Z880 Allergy status to penicillin: Secondary | ICD-10-CM | POA: Diagnosis not present

## 2021-05-25 DIAGNOSIS — Z885 Allergy status to narcotic agent status: Secondary | ICD-10-CM | POA: Diagnosis not present

## 2021-05-25 DIAGNOSIS — Z4789 Encounter for other orthopedic aftercare: Secondary | ICD-10-CM | POA: Diagnosis not present

## 2021-05-25 DIAGNOSIS — Z888 Allergy status to other drugs, medicaments and biological substances status: Secondary | ICD-10-CM | POA: Diagnosis not present

## 2021-05-25 DIAGNOSIS — Z881 Allergy status to other antibiotic agents status: Secondary | ICD-10-CM | POA: Diagnosis not present

## 2021-05-25 DIAGNOSIS — M85822 Other specified disorders of bone density and structure, left upper arm: Secondary | ICD-10-CM | POA: Diagnosis not present

## 2021-06-07 DIAGNOSIS — F411 Generalized anxiety disorder: Secondary | ICD-10-CM | POA: Diagnosis not present

## 2021-06-07 DIAGNOSIS — F03918 Unspecified dementia, unspecified severity, with other behavioral disturbance: Secondary | ICD-10-CM | POA: Diagnosis not present

## 2021-06-07 DIAGNOSIS — F331 Major depressive disorder, recurrent, moderate: Secondary | ICD-10-CM | POA: Diagnosis not present

## 2021-06-21 DIAGNOSIS — E70319 Ocular albinism, unspecified: Secondary | ICD-10-CM | POA: Diagnosis not present

## 2021-06-21 DIAGNOSIS — I4891 Unspecified atrial fibrillation: Secondary | ICD-10-CM | POA: Diagnosis not present

## 2021-06-21 DIAGNOSIS — T7840XD Allergy, unspecified, subsequent encounter: Secondary | ICD-10-CM | POA: Diagnosis not present

## 2021-06-21 DIAGNOSIS — G40909 Epilepsy, unspecified, not intractable, without status epilepticus: Secondary | ICD-10-CM | POA: Diagnosis not present

## 2021-06-21 DIAGNOSIS — I10 Essential (primary) hypertension: Secondary | ICD-10-CM | POA: Diagnosis not present

## 2021-06-21 DIAGNOSIS — E039 Hypothyroidism, unspecified: Secondary | ICD-10-CM | POA: Diagnosis not present

## 2021-06-21 DIAGNOSIS — K219 Gastro-esophageal reflux disease without esophagitis: Secondary | ICD-10-CM | POA: Diagnosis not present

## 2021-06-21 DIAGNOSIS — F03918 Unspecified dementia, unspecified severity, with other behavioral disturbance: Secondary | ICD-10-CM | POA: Diagnosis not present

## 2021-06-21 DIAGNOSIS — Z7409 Other reduced mobility: Secondary | ICD-10-CM | POA: Diagnosis not present

## 2021-06-21 DIAGNOSIS — E785 Hyperlipidemia, unspecified: Secondary | ICD-10-CM | POA: Diagnosis not present

## 2021-06-21 DIAGNOSIS — Z8673 Personal history of transient ischemic attack (TIA), and cerebral infarction without residual deficits: Secondary | ICD-10-CM | POA: Diagnosis not present

## 2021-06-23 DIAGNOSIS — E039 Hypothyroidism, unspecified: Secondary | ICD-10-CM | POA: Diagnosis not present

## 2021-07-04 DIAGNOSIS — M1712 Unilateral primary osteoarthritis, left knee: Secondary | ICD-10-CM | POA: Diagnosis not present

## 2021-07-04 DIAGNOSIS — S42202D Unspecified fracture of upper end of left humerus, subsequent encounter for fracture with routine healing: Secondary | ICD-10-CM | POA: Diagnosis not present

## 2021-07-04 DIAGNOSIS — Z4789 Encounter for other orthopedic aftercare: Secondary | ICD-10-CM | POA: Diagnosis not present

## 2021-08-25 ENCOUNTER — Ambulatory Visit: Payer: PPO | Admitting: Adult Health

## 2021-10-10 ENCOUNTER — Ambulatory Visit: Payer: PPO | Admitting: Adult Health

## 2021-10-15 DEATH — deceased
